# Patient Record
Sex: Male | Born: 1945 | Race: White | Hispanic: No | Marital: Single | Smoking: Former smoker
Health system: Southern US, Community
[De-identification: ages and names within clinical notes are randomized; demographics above are authoritative.]

## PROBLEM LIST (undated history)

## (undated) DIAGNOSIS — E785 Hyperlipidemia, unspecified: Secondary | ICD-10-CM

## (undated) DIAGNOSIS — M545 Low back pain: Secondary | ICD-10-CM

## (undated) DIAGNOSIS — Z86010 Personal history of colonic polyps: Secondary | ICD-10-CM

## (undated) DIAGNOSIS — M7551 Bursitis of right shoulder: Secondary | ICD-10-CM

## (undated) DIAGNOSIS — H269 Unspecified cataract: Secondary | ICD-10-CM

## (undated) DIAGNOSIS — C4491 Basal cell carcinoma of skin, unspecified: Secondary | ICD-10-CM

## (undated) DIAGNOSIS — R7303 Prediabetes: Secondary | ICD-10-CM

## (undated) DIAGNOSIS — M1990 Unspecified osteoarthritis, unspecified site: Secondary | ICD-10-CM

## (undated) DIAGNOSIS — M999 Biomechanical lesion, unspecified: Secondary | ICD-10-CM

## (undated) DIAGNOSIS — E7211 Homocystinuria: Secondary | ICD-10-CM

## (undated) DIAGNOSIS — Z8739 Personal history of other diseases of the musculoskeletal system and connective tissue: Secondary | ICD-10-CM

## (undated) DIAGNOSIS — K2270 Barrett's esophagus without dysplasia: Secondary | ICD-10-CM

## (undated) DIAGNOSIS — N189 Chronic kidney disease, unspecified: Secondary | ICD-10-CM

## (undated) DIAGNOSIS — C61 Malignant neoplasm of prostate: Secondary | ICD-10-CM

## (undated) DIAGNOSIS — T7840XA Allergy, unspecified, initial encounter: Secondary | ICD-10-CM

## (undated) DIAGNOSIS — Z8546 Personal history of malignant neoplasm of prostate: Secondary | ICD-10-CM

## (undated) DIAGNOSIS — I5189 Other ill-defined heart diseases: Secondary | ICD-10-CM

## (undated) HISTORY — DX: Malignant neoplasm of prostate: C61

## (undated) HISTORY — DX: Unspecified cataract: H26.9

## (undated) HISTORY — DX: Unspecified osteoarthritis, unspecified site: M19.90

## (undated) HISTORY — PX: SHOULDER ARTHROSCOPY: SHX128

## (undated) HISTORY — PX: SIGMOIDOSCOPY: SUR1295

## (undated) HISTORY — DX: Allergy, unspecified, initial encounter: T78.40XA

## (undated) HISTORY — PX: TONSILLECTOMY AND ADENOIDECTOMY: SUR1326

---
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
## 1996-02-01 HISTORY — PX: PROSTATECTOMY: SHX69

## 1997-06-12 ENCOUNTER — Encounter: Admission: RE | Admit: 1997-06-12 | Discharge: 1997-06-14 | Payer: Self-pay | Admitting: Radiation Oncology

## 1997-06-14 ENCOUNTER — Encounter: Admission: RE | Admit: 1997-06-14 | Discharge: 1997-09-12 | Payer: Self-pay | Admitting: Radiation Oncology

## 1999-05-26 ENCOUNTER — Ambulatory Visit (HOSPITAL_COMMUNITY): Admission: RE | Admit: 1999-05-26 | Discharge: 1999-05-26 | Payer: Self-pay | Admitting: Internal Medicine

## 1999-05-26 ENCOUNTER — Encounter: Payer: Self-pay | Admitting: Internal Medicine

## 1999-07-21 ENCOUNTER — Ambulatory Visit (HOSPITAL_COMMUNITY): Admission: RE | Admit: 1999-07-21 | Discharge: 1999-07-21 | Payer: Self-pay | Admitting: Neurological Surgery

## 1999-07-21 ENCOUNTER — Encounter: Payer: Self-pay | Admitting: Neurological Surgery

## 1999-08-31 ENCOUNTER — Ambulatory Visit (HOSPITAL_COMMUNITY): Admission: RE | Admit: 1999-08-31 | Discharge: 1999-08-31 | Payer: Self-pay | Admitting: Neurological Surgery

## 1999-08-31 ENCOUNTER — Encounter: Payer: Self-pay | Admitting: Neurological Surgery

## 1999-09-15 ENCOUNTER — Encounter: Payer: Self-pay | Admitting: Neurological Surgery

## 1999-09-15 ENCOUNTER — Ambulatory Visit (HOSPITAL_COMMUNITY): Admission: RE | Admit: 1999-09-15 | Discharge: 1999-09-15 | Payer: Self-pay | Admitting: Neurological Surgery

## 2000-02-01 HISTORY — PX: LUMBAR DISC SURGERY: SHX700

## 2000-04-10 ENCOUNTER — Ambulatory Visit (HOSPITAL_COMMUNITY): Admission: RE | Admit: 2000-04-10 | Discharge: 2000-04-10 | Payer: Self-pay | Admitting: Neurological Surgery

## 2000-04-10 ENCOUNTER — Encounter: Payer: Self-pay | Admitting: Neurological Surgery

## 2000-05-16 ENCOUNTER — Ambulatory Visit (HOSPITAL_COMMUNITY): Admission: RE | Admit: 2000-05-16 | Discharge: 2000-05-17 | Payer: Self-pay | Admitting: Neurological Surgery

## 2000-05-16 ENCOUNTER — Encounter: Payer: Self-pay | Admitting: Neurological Surgery

## 2002-11-20 ENCOUNTER — Encounter: Payer: Self-pay | Admitting: Internal Medicine

## 2002-11-29 ENCOUNTER — Encounter: Admission: RE | Admit: 2002-11-29 | Discharge: 2002-11-29 | Payer: Self-pay | Admitting: Internal Medicine

## 2003-07-22 ENCOUNTER — Observation Stay (HOSPITAL_COMMUNITY): Admission: RE | Admit: 2003-07-22 | Discharge: 2003-07-23 | Payer: Self-pay | Admitting: Internal Medicine

## 2004-03-24 ENCOUNTER — Ambulatory Visit: Payer: Self-pay | Admitting: Internal Medicine

## 2004-03-30 ENCOUNTER — Ambulatory Visit: Payer: Self-pay | Admitting: Internal Medicine

## 2005-05-02 ENCOUNTER — Ambulatory Visit: Payer: Self-pay | Admitting: Internal Medicine

## 2005-05-13 ENCOUNTER — Ambulatory Visit: Payer: Self-pay | Admitting: Internal Medicine

## 2006-05-11 ENCOUNTER — Ambulatory Visit: Payer: Self-pay | Admitting: Internal Medicine

## 2006-05-11 LAB — CONVERTED CEMR LAB
ALT: 24 units/L (ref 0–40)
Albumin: 4 g/dL (ref 3.5–5.2)
Alkaline Phosphatase: 65 units/L (ref 39–117)
Chloride: 104 meq/L (ref 96–112)
Direct LDL: 111.1 mg/dL
GFR calc Af Amer: 79 mL/min
GFR calc non Af Amer: 65 mL/min
HDL: 69.9 mg/dL (ref >39.0)
Monocytes Absolute: 0.9 10*3/uL — ABNORMAL HIGH (ref 0.2–0.7)
Monocytes Relative: 12.2 % — ABNORMAL HIGH (ref 3.0–11.0)
Neutro Abs: 4.4 10*3/uL (ref 1.4–7.7)
Potassium: 4.5 meq/L (ref 3.5–5.1)
RBC: 4.72 M/uL (ref 4.22–5.81)
RDW: 13.6 % (ref 11.5–14.6)
Sodium: 140 meq/L (ref 135–145)
TSH: 1.21 microintl units/mL (ref 0.35–5.50)
Total Bilirubin: 0.8 mg/dL (ref 0.3–1.2)
VLDL: 20 mg/dL (ref 0–40)

## 2006-05-19 ENCOUNTER — Ambulatory Visit: Payer: Self-pay | Admitting: Internal Medicine

## 2006-05-30 ENCOUNTER — Ambulatory Visit: Payer: Self-pay | Admitting: Internal Medicine

## 2006-06-13 ENCOUNTER — Encounter: Admission: RE | Admit: 2006-06-13 | Discharge: 2006-06-13 | Payer: Self-pay | Admitting: Internal Medicine

## 2006-09-28 ENCOUNTER — Ambulatory Visit: Payer: Self-pay | Admitting: Internal Medicine

## 2006-10-03 ENCOUNTER — Encounter (INDEPENDENT_AMBULATORY_CARE_PROVIDER_SITE_OTHER): Payer: Self-pay | Admitting: *Deleted

## 2006-10-17 ENCOUNTER — Ambulatory Visit: Payer: Self-pay | Admitting: Internal Medicine

## 2007-02-06 ENCOUNTER — Ambulatory Visit: Payer: Self-pay | Admitting: Gastroenterology

## 2007-02-14 ENCOUNTER — Encounter: Payer: Self-pay | Admitting: Internal Medicine

## 2007-02-14 ENCOUNTER — Encounter: Payer: Self-pay | Admitting: Gastroenterology

## 2007-02-14 ENCOUNTER — Ambulatory Visit: Payer: Self-pay | Admitting: Gastroenterology

## 2007-10-15 ENCOUNTER — Telehealth (INDEPENDENT_AMBULATORY_CARE_PROVIDER_SITE_OTHER): Payer: Self-pay | Admitting: *Deleted

## 2007-11-19 ENCOUNTER — Ambulatory Visit: Payer: Self-pay | Admitting: Internal Medicine

## 2007-11-19 ENCOUNTER — Telehealth: Payer: Self-pay | Admitting: Internal Medicine

## 2007-11-19 LAB — CONVERTED CEMR LAB
Basophils Absolute: 0.1 10*3/uL (ref 0.0–0.1)
Eosinophils Absolute: 0.1 10*3/uL (ref 0.0–0.7)
Eosinophils Relative: 1.5 % (ref 0.0–5.0)
Lymphocytes Relative: 27 % (ref 12.0–46.0)
MCV: 89 fL (ref 78.0–100.0)
Monocytes Relative: 13.5 % — ABNORMAL HIGH (ref 3.0–12.0)
Neutrophils Relative %: 57.2 % (ref 43.0–77.0)
Platelets: 226 10*3/uL (ref 150–400)
RDW: 13.7 % (ref 11.5–14.6)
WBC: 8 10*3/uL (ref 4.5–10.5)

## 2007-12-03 ENCOUNTER — Telehealth (INDEPENDENT_AMBULATORY_CARE_PROVIDER_SITE_OTHER): Payer: Self-pay | Admitting: *Deleted

## 2008-01-04 ENCOUNTER — Ambulatory Visit: Payer: Self-pay | Admitting: Internal Medicine

## 2008-02-05 ENCOUNTER — Encounter (INDEPENDENT_AMBULATORY_CARE_PROVIDER_SITE_OTHER): Payer: Self-pay | Admitting: *Deleted

## 2008-03-04 ENCOUNTER — Ambulatory Visit: Payer: Self-pay | Admitting: Internal Medicine

## 2008-03-04 LAB — CONVERTED CEMR LAB
Alkaline Phosphatase: 65 units/L (ref 39–117)
Calcium: 9.5 mg/dL (ref 8.4–10.5)
Chloride: 109 meq/L (ref 96–112)
GFR calc non Af Amer: 65 mL/min
Glucose, Bld: 86 mg/dL (ref 70–99)
HCT: 41.7 % (ref 39.0–52.0)
Hemoglobin, Urine: NEGATIVE
Hemoglobin: 14.3 g/dL (ref 13.0–17.0)
Ketones, ur: NEGATIVE mg/dL
LDL Cholesterol: 141 mg/dL — ABNORMAL HIGH (ref 0–99)
Leukocytes, UA: NEGATIVE
Lymphocytes Relative: 27.7 % (ref 12.0–46.0)
Monocytes Absolute: 0.7 10*3/uL (ref 0.1–1.0)
Monocytes Relative: 11.8 % (ref 3.0–12.0)
Nitrite: NEGATIVE
PSA: 0.24 ng/mL (ref 0.10–4.00)
Platelets: 180 10*3/uL (ref 150–400)
Potassium: 4.4 meq/L (ref 3.5–5.1)
RBC: 4.64 M/uL (ref 4.22–5.81)
Sodium: 143 meq/L (ref 135–145)
Specific Gravity, Urine: 1.02 (ref 1.000–1.03)
TSH: 1.37 microintl units/mL (ref 0.35–5.50)
Total Protein, Urine: NEGATIVE mg/dL
Urine Glucose: NEGATIVE mg/dL
WBC: 5.6 10*3/uL (ref 4.5–10.5)
pH: 6 (ref 5.0–8.0)

## 2008-03-12 ENCOUNTER — Ambulatory Visit: Payer: Self-pay | Admitting: Internal Medicine

## 2008-03-12 DIAGNOSIS — Z8546 Personal history of malignant neoplasm of prostate: Secondary | ICD-10-CM

## 2008-03-12 DIAGNOSIS — Z8739 Personal history of other diseases of the musculoskeletal system and connective tissue: Secondary | ICD-10-CM

## 2008-03-12 DIAGNOSIS — Z860100 Personal history of colon polyps, unspecified: Secondary | ICD-10-CM

## 2008-03-12 DIAGNOSIS — E785 Hyperlipidemia, unspecified: Secondary | ICD-10-CM

## 2008-03-12 HISTORY — DX: Personal history of colonic polyps: Z86.010

## 2008-03-12 HISTORY — DX: Personal history of other diseases of the musculoskeletal system and connective tissue: Z87.39

## 2008-03-12 HISTORY — DX: Personal history of malignant neoplasm of prostate: Z85.46

## 2008-03-12 HISTORY — DX: Personal history of colon polyps, unspecified: Z86.0100

## 2008-03-12 HISTORY — DX: Hyperlipidemia, unspecified: E78.5

## 2008-03-12 LAB — CONVERTED CEMR LAB
Cholesterol, target level: 200 mg/dL
HDL goal, serum: 40 mg/dL
LDL Goal: 160 mg/dL

## 2008-03-14 ENCOUNTER — Encounter (INDEPENDENT_AMBULATORY_CARE_PROVIDER_SITE_OTHER): Payer: Self-pay | Admitting: *Deleted

## 2008-03-14 ENCOUNTER — Telehealth: Payer: Self-pay | Admitting: Internal Medicine

## 2008-03-14 LAB — CONVERTED CEMR LAB
Rhuematoid fact SerPl-aCnc: 40.4 intl units/mL — ABNORMAL HIGH (ref 0.0–20.0)
Sed Rate: 10 mm/hr (ref 0–16)
Uric Acid, Serum: 7.8 mg/dL (ref 4.0–7.8)

## 2008-03-19 ENCOUNTER — Telehealth (INDEPENDENT_AMBULATORY_CARE_PROVIDER_SITE_OTHER): Payer: Self-pay | Admitting: *Deleted

## 2008-03-25 ENCOUNTER — Encounter: Payer: Self-pay | Admitting: Internal Medicine

## 2008-06-04 ENCOUNTER — Ambulatory Visit: Payer: Self-pay | Admitting: Internal Medicine

## 2008-06-04 LAB — CONVERTED CEMR LAB: Rapid Strep: NEGATIVE

## 2008-08-25 ENCOUNTER — Ambulatory Visit: Payer: Self-pay | Admitting: Internal Medicine

## 2008-10-09 ENCOUNTER — Encounter: Payer: Self-pay | Admitting: Internal Medicine

## 2009-01-09 ENCOUNTER — Ambulatory Visit: Payer: Self-pay | Admitting: Family

## 2009-01-13 ENCOUNTER — Telehealth (INDEPENDENT_AMBULATORY_CARE_PROVIDER_SITE_OTHER): Payer: Self-pay | Admitting: *Deleted

## 2009-02-17 ENCOUNTER — Telehealth (INDEPENDENT_AMBULATORY_CARE_PROVIDER_SITE_OTHER): Payer: Self-pay | Admitting: *Deleted

## 2009-03-16 ENCOUNTER — Ambulatory Visit: Payer: Self-pay | Admitting: Internal Medicine

## 2009-03-22 LAB — CONVERTED CEMR LAB
AST: 30 units/L (ref 0–37)
Alkaline Phosphatase: 63 units/L (ref 39–117)
Basophils Relative: 0.7 % (ref 0.0–3.0)
Calcium: 9.8 mg/dL (ref 8.4–10.5)
Cholesterol: 242 mg/dL — ABNORMAL HIGH (ref 0–200)
Creatinine, Ser: 1 mg/dL (ref 0.4–1.5)
Eosinophils Absolute: 0.1 10*3/uL (ref 0.0–0.7)
Eosinophils Relative: 2.1 % (ref 0.0–5.0)
GFR calc non Af Amer: 79.99 mL/min (ref >60)
Glucose, Bld: 88 mg/dL (ref 70–99)
HCT: 43.3 % (ref 39.0–52.0)
HDL: 81.2 mg/dL (ref >39.00)
Ketones, ur: NEGATIVE mg/dL
Leukocytes, UA: NEGATIVE
Lymphs Abs: 1.8 10*3/uL (ref 0.7–4.0)
MCHC: 33.3 g/dL (ref 30.0–36.0)
MCV: 94.5 fL (ref 78.0–100.0)
PSA: 0.68 ng/mL (ref 0.10–4.00)
Platelets: 181 10*3/uL (ref 150.0–400.0)
RBC: 4.58 M/uL (ref 4.22–5.81)
RDW: 14.5 % (ref 11.5–14.6)
Specific Gravity, Urine: <=1.005 (ref 1.000–1.030)
TSH: 0.89 microintl units/mL (ref 0.35–5.50)
Total Bilirubin: 0.7 mg/dL (ref 0.3–1.2)
Total Protein, Urine: NEGATIVE mg/dL
Urine Glucose: NEGATIVE mg/dL
Urobilinogen, UA: 0.2 (ref 0.0–1.0)
VLDL: 13.6 mg/dL (ref 0.0–40.0)

## 2009-03-24 ENCOUNTER — Ambulatory Visit: Payer: Self-pay | Admitting: Internal Medicine

## 2009-09-10 ENCOUNTER — Encounter: Payer: Self-pay | Admitting: Internal Medicine

## 2009-09-17 ENCOUNTER — Ambulatory Visit: Payer: Self-pay | Admitting: Internal Medicine

## 2009-09-17 LAB — CONVERTED CEMR LAB: PSA: 0.68 ng/mL (ref 0.10–4.00)

## 2009-09-24 ENCOUNTER — Encounter: Payer: Self-pay | Admitting: Internal Medicine

## 2009-09-30 ENCOUNTER — Encounter: Payer: Self-pay | Admitting: Internal Medicine

## 2009-12-15 ENCOUNTER — Encounter: Payer: Self-pay | Admitting: Internal Medicine

## 2009-12-16 ENCOUNTER — Ambulatory Visit: Payer: Self-pay | Admitting: Internal Medicine

## 2009-12-16 ENCOUNTER — Encounter: Payer: Self-pay | Admitting: Internal Medicine

## 2009-12-16 DIAGNOSIS — R079 Chest pain, unspecified: Secondary | ICD-10-CM

## 2009-12-16 DIAGNOSIS — R0789 Other chest pain: Secondary | ICD-10-CM

## 2009-12-17 LAB — CONVERTED CEMR LAB
CK-MB: 1.2 ng/mL (ref 0.3–4.0)
Eosinophils Absolute: 0.1 10*3/uL (ref 0.0–0.7)
Eosinophils Relative: 1.5 % (ref 0.0–5.0)
Lymphocytes Relative: 29.7 % (ref 12.0–46.0)
MCV: 95.9 fL (ref 78.0–100.0)
Monocytes Relative: 12.2 % — ABNORMAL HIGH (ref 3.0–12.0)
Neutrophils Relative %: 56.1 % (ref 43.0–77.0)
Platelets: 223 10*3/uL (ref 150.0–400.0)
RBC: 4.47 M/uL (ref 4.22–5.81)
Total CK: 77 units/L (ref 7–232)
Troponin I: 0.01 ng/mL (ref <0.06)

## 2009-12-21 ENCOUNTER — Telehealth (INDEPENDENT_AMBULATORY_CARE_PROVIDER_SITE_OTHER): Payer: Self-pay | Admitting: *Deleted

## 2009-12-22 ENCOUNTER — Ambulatory Visit: Payer: Self-pay | Admitting: Physician Assistant

## 2009-12-22 ENCOUNTER — Ambulatory Visit: Payer: Self-pay

## 2009-12-22 ENCOUNTER — Encounter (HOSPITAL_COMMUNITY)
Admission: RE | Admit: 2009-12-22 | Discharge: 2010-03-02 | Payer: Self-pay | Source: Home / Self Care | Attending: Internal Medicine | Admitting: Internal Medicine

## 2010-01-14 ENCOUNTER — Ambulatory Visit: Payer: Self-pay | Admitting: Internal Medicine

## 2010-01-14 DIAGNOSIS — J069 Acute upper respiratory infection, unspecified: Secondary | ICD-10-CM | POA: Insufficient documentation

## 2010-02-15 ENCOUNTER — Telehealth: Payer: Self-pay | Admitting: Internal Medicine

## 2010-02-19 ENCOUNTER — Encounter: Payer: Self-pay | Admitting: Internal Medicine

## 2010-03-01 ENCOUNTER — Ambulatory Visit
Admission: RE | Admit: 2010-03-01 | Discharge: 2010-03-01 | Payer: Self-pay | Source: Home / Self Care | Attending: Internal Medicine | Admitting: Internal Medicine

## 2010-03-01 ENCOUNTER — Other Ambulatory Visit: Payer: Self-pay | Admitting: Internal Medicine

## 2010-03-02 ENCOUNTER — Telehealth (INDEPENDENT_AMBULATORY_CARE_PROVIDER_SITE_OTHER): Payer: Self-pay | Admitting: *Deleted

## 2010-03-02 NOTE — Miscellaneous (Signed)
Summary: [facility]   Imported By: Lanelle Bal 10/13/2009 09:06:31  _____________________________________________________________________  External Attachment:    Type:   Image     Comment:   External Document

## 2010-03-02 NOTE — Progress Notes (Signed)
Summary: Nuclear pre procedure  Phone Note Outgoing Call Call back at [facility] Phone [phone]   Call placed by: Rea College, CMA,  December 21, 2009 4:53 PM Call placed to: Patient Summary of Call: Left message with information on My[employer] Information Sheet (see scanned document for details).      Nuclear Med Background Indications for Stress Test: Evaluation for Ischemia     Symptoms: Chest Pressure, Chest Pressure with Exertion    Nuclear Pre-Procedure Height (in): 72

## 2010-03-02 NOTE — Letter (Signed)
Summary: [facility]  [facility]   Imported By: Lanelle Bal 10/06/2009 10:58:57  _____________________________________________________________________  External Attachment:    Type:   Image     Comment:   External Document

## 2010-03-02 NOTE — Assessment & Plan Note (Signed)
Summary: YEARLY//PH   Vital Signs:  Patient profile:   65 year old male Height:      72 inches Weight:      193.4 pounds BMI:     26.32 Temp:     98.8 degrees F oral Resp:     14 per minute BP sitting:   122 / 78  (left arm) Cuff size:   large  Vitals Entered By: Shonna Chock (March 24, 2009 10:49 AM)  Comments REVIEWED MED LIST, PATIENT AGREED DOSE AND INSTRUCTION CORRECT    History of Present Illness: Tom Gaines is here for a physical; he is asymptomatic.  Allergies: 1)  ! Pcn  Past History:  Past Medical History: Plantar fasciitis, PMH of Prostate cancer, hx of, Dr Darvin Neighbours Colonic polyps, hx of,Dr Russella Dar Hyperlipidemia: NMR Lipoprofile 2004: LDL 126(1084/80), HDL 73.5, normal TG. LDL goal = < 160, ideal < 161 Pharyngitis with dysphagia, PMH of  2006 Gout, PMH of OSscleral  hyperpigmentation  Past Surgical History: Lumbar discectomy L3-4  Dr Danielle Dess 2002 Colon polypectomy 1998,negative 2008,Dr [facility] Prostatectomy 1998, Radiation 1999, Dr Darvin Neighbours Tonsillectomy L shoulder surgery 1994, Dr Elwyn Lade shoulder surgery 02/25/2009, Dr Thurston Hole  Family History: Father: unknown medical history Mother:AODM,HTN Siblings: sister weight excess;cousin polio  Social History: Occupation: retired Married Former Smoker:quit 1985 Alcohol use-yes: socailly 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         Occasional pill dysphagia. GU:  Denies discharge, dysuria, and hematuria; Nocturia X 1-2 chronically. PSA was 0.51 in 12/2008; appt with Dr Earlene Plater  03/31/2009.  Physical Exam  General:  well-nourished; alert,appropriate and cooperative throughout examination Head:  Normocephalic and atraumatic without obvious abnormalities.  Eyes:  No corneal or conjunctival inflammation noted.Perrla. Funduscopic exam benign, without hemorrhages, exudates or papilledema. Curvilinear hyperpigmentation OS sclera Ears:  External ear exam shows no significant lesions or deformities.  Otoscopic examination reveals clear canals, tympanic membranes are intact bilaterally without bulging, retraction, inflammation or discharge. Hearing is grossly normal bilaterally. Nose:  External nasal examination shows no deformity or inflammation. Nasal mucosa are pink and moist without lesions or exudates. Mouth:  Oral mucosa and oropharynx without lesions or exudates.  Teeth in good repair. Neck:  No deformities, masses, or tenderness noted. Lungs:  Normal respiratory effort, chest expands symmetrically. Lungs are clear to auscultation, no crackles or wheezes. Heart:  Normal rate and regular rhythm. S1 and S2 normal without gallop, murmur, click, rub . S4 with slurring Abdomen:  Bowel sounds positive,abdomen soft and non-tender without masses, organomegaly or hernias noted. Genitalia:  Dr Earlene Plater Msk:  No deformity or scoliosis noted of thoracic or lumbar spine.   Pulses:  R and L carotid,radial,dorsalis pedis and posterior tibial pulses are full and equal bilaterally Extremities:  No clubbing, cyanosis, edema, or deformity noted with normal full range of motion of all joints.  Minimal crepitus R shoulder Neurologic:  alert & oriented X3 and DTRs symmetrical and normal.   Skin:  Intact without suspicious lesions or rashes Cervical Nodes:  No lymphadenopathy noted Axillary Nodes:  No palpable lymphadenopathy Psych:  memory intact for recent and remote, normally interactive, and good eye contact.     Impression & Recommendations:  Problem #  1:  PREVENTIVE HEALTH CARE  (ICD-V70.0)  Orders: EKG w/ Interpretation (93000)  Problem # 2:  HYPERLIPIDEMIA (ICD-272.4)  Orders: EKG w/ Interpretation (93000)  Problem # 3:  PROSTATE CANCER, HX OF (ICD-V10.46) as per Dr  Earlene Plater  Problem # 4:  COLONIC POLYPS, HX OF (ICD-V12.72) as per Dr Russella Dar  Complete Medication List: 1)  Allopurinol 100 Mg Tabs (Allopurinol) .Marland Kitchen.. 1 by mouth once daily 2)  Bayer Low Strength 81 Mg Tbec (Aspirin) .Marland Kitchen.. 1 by mouth once daily 3)  Lutein 10 Mg Tabs (Lutein) .Marland Kitchen.. 1 by mouth once daily 4)  Multivitamins Tabs (Multiple vitamin) .Marland Kitchen.. 1 by mouth once daily 5)  Aleve 220 Mg Tabs (Naproxen sodium) .Marland Kitchen.. 1-2 two times a day as needed 6)  Fish Oil 1000 Mg Caps (Omega-3 fatty acids) .... 2 once daily 7)  Vitamin C 500 Mg Tabs (Ascorbic acid) .Marland Kitchen.. 1 by mouth once daily  Patient Instructions: 1)  Based on NMR Lipoprofile your LDL goal = < 160, ideally < 130.

## 2010-03-02 NOTE — Letter (Signed)
Summary: [facility]  [facility]   Imported By: Lanelle Bal 10/14/2009 12:09:49  _____________________________________________________________________  External Attachment:    Type:   Image     Comment:   External Document

## 2010-03-02 NOTE — Letter (Signed)
Summary: E Mail from Patient with Concerns  E Mail from Patient with Concerns   Imported By: Lanelle Bal 12/25/2009 10:25:46  _____________________________________________________________________  External Attachment:    Type:   Image     Comment:   External Document

## 2010-03-02 NOTE — Progress Notes (Signed)
Summary: LAB ORDERS  Phone Note Call from Patient   Caller: Patient Summary of Call: NEED ORDERS FOR LABS SO PT CAN GO TO [facility] HAVE  THIS DONE. HE HAS A CPX TO BE DONE IN FEB ALSO. NEED ORDER FOR 2.14.11 TO BE PUT IN Initial call taken by: Freddy Jaksch,  February 17, 2009 2:25 PM  Follow-up for Phone Call        Lipid,hepatic,cbcd,bmp,tsh,psa,udip,stool cards v70.0/272.4 Follow-up by: Shonna Chock,  March 02, 2009 5:12 PM

## 2010-03-02 NOTE — Letter (Signed)
Summary: E Mail from Patient Regarding PSA  E Mail from Patient Regarding PSA   Imported By: Lanelle Bal 09/17/2009 11:15:39  _____________________________________________________________________  External Attachment:    Type:   Image     Comment:   External Document

## 2010-03-02 NOTE — Miscellaneous (Signed)
Summary: Orders Update  Clinical Lists Changes  Orders: Added new Test order of T-2 View CXR (71020TC) - Signed 

## 2010-03-02 NOTE — Assessment & Plan Note (Signed)
Summary: strange feeling in chest,pressure in ears/cbs   Vital Signs:  Patient profile:   65 year old male Height:      72 inches Weight:      191 pounds O2 Sat:      94 % on Room air Temp:     98.0 degrees F oral Pulse rate:   68 / minute Resp:     14 per minute BP sitting:   118 / 70  (left arm)  Vitals Entered By: Jeremy Johann CMA (December 16, 2009 12:22 PM)  O2 Flow:  Room air CC: funny feeling in chest, pressure in head x1day   CC:  funny feeling in chest and pressure in head x1day.  History of Present Illness:      This is a 65 year old male who presents with Chest pain for > 24 hrs.  This was  exertional chest pain,starting ? 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 He  denies nausea, vomiting, diaphoresis, shortness of breath, palpitations, dizziness, light headedness, syncope, and indigestion.  The painwas  described as constant and dull.  The pain is located in the right upper chest; it  radiated  to the left anterior chest and neck.  Chest pain persists but substantially better, " > 50% better".  The pain is brought on or made worse by  waist flexion.No FH CAD.  Current Medications (verified): 1)  Allopurinol 100 Mg Tabs (Allopurinol) .Marland Kitchen.. 1 By Mouth Once Daily 2)  Bayer Low Strength 81 Mg Tbec (Aspirin) .Marland Kitchen.. 1 By Mouth Once Daily 3)  Lutein 10 Mg Tabs (Lutein) .Marland Kitchen.. 1 By Mouth Once Daily 4)  Multivitamins  Tabs (Multiple Vitamin) .Marland Kitchen.. 1 By Mouth Once Daily 5)  Aleve 220 Mg Tabs (Naproxen Sodium) .... As Needed Only 6)  Fish Oil 1000 Mg Caps (Omega-3 Fatty Acids) .... 2 Once Daily 7)  Vitamin C 500 Mg Tabs (Ascorbic Acid) .Marland Kitchen.. 1 By Mouth Once Daily  Allergies (verified): 1)  ! Pcn  Review of Systems ENT:  Denies difficulty swallowing and hoarseness. GI:  Denies bloody stools, dark tarry stools, and loss of appetite; No dysphagia.  Physical Exam  General:   well-nourished,in no acute distress; alert,appropriate and cooperative throughout examination Eyes:  No corneal or conjunctival inflammation noted. No icterus Mouth:  Oral mucosa and oropharynx without lesions or exudates.  Teeth in good repair. No pharyngeal erythema.   Lungs:  Normal respiratory effort, chest expands symmetrically. Lungs are clear to auscultation, no crackles or wheezes. Heart:  regular rhythm, no murmur, no gallop, no rub, no JVD, no HJR, and bradycardia.   Pulses:  R and L carotid,radial,dorsalis pedis and posterior tibial pulses are full and equal bilaterally Extremities:  No clubbing, cyanosis, edema. Homan's negative Skin:  Intact without suspicious lesions or rashes Cervical Nodes:  No lymphadenopathy noted Axillary Nodes:  No palpable lymphadenopathy Psych:  memory intact for recent and remote, normally interactive, and good eye contact.     Impression & Recommendations:  Problem # 1:  CHEST DISCOMFORT (ICD-786.59)  atypical, R/O Hiatal Hernia with reflux  Orders: EKG w/ Interpretation (93000) Venipuncture (75643) TLB-CBC Platelet - w/Differential (85025-CBCD) TLB-Cardiac Panel ([id number]) T-D-Dimer Fibrin Derivatives Quantitive [id number]) T-Troponin I ([id number]) Misc. Referral (Misc. Ref)  Complete Medication List: 1)  Allopurinol 100 Mg Tabs (Allopurinol) .Marland Kitchen.. 1 by mouth once daily 2)  Bayer Low Strength 81 Mg Tbec (Aspirin) .Marland KitchenMarland KitchenMarland Kitchen 1  by mouth once daily 3)  Lutein 10 Mg Tabs (Lutein) .Marland Kitchen.. 1 by mouth once daily 4)  Multivitamins Tabs (Multiple vitamin) .Marland Kitchen.. 1 by mouth once daily 5)  Aleve 220 Mg Tabs (Naproxen sodium) .... As needed only 6)  Fish Oil 1000 Mg Caps (Omega-3 fatty acids) .... 2 once daily 7)  Vitamin C 500 Mg Tabs (Ascorbic acid) .Marland Kitchen.. 1 by mouth once daily 8)  Omeprazole 20 Mg Cpdr (Omeprazole) .Marland Kitchen.. 1 pill 30 min pre b'fast  Patient Instructions: 1)  Hold exercise until stress test is completed. 2)  Avoid foods high in acid  (tomatoes, citrus juices, spicy foods). Avoid eating within two hours of lying down or before exercising. Do not over eat; try smaller more frequent meals. Elevate head of bed twelve inches when sleeping. Prescriptions: OMEPRAZOLE 20 MG CPDR (OMEPRAZOLE) 1 pill 30 min pre b'fast  #30 x 0   Entered and Authorized by:   Marga Melnick MD   Signed by:   Marga Melnick MD on 12/16/2009   Method used:   Print then Give to Patient   RxID:   [id number]    Orders Added: 1)  EKG w/ Interpretation [93000] 2)  Est. Patient Level IV [36644] 3)  Venipuncture [03474] 4)  TLB-CBC Platelet - w/Differential [85025-CBCD] 5)  TLB-Cardiac Panel [[id number]] 6)  T-D-Dimer Fibrin Derivatives Quantitive [[id number]] 7)  T-Troponin I [[id number]] 8)  Misc. Referral [Misc. Ref]

## 2010-03-04 NOTE — Progress Notes (Signed)
Summary: Med refill--[facility]  Phone Note Refill Request Message from:  Patient on February 15, 2010 4:00 PM  Refills Requested: Medication #1:  OMEPRAZOLE 20 MG CPDR 1 pill 30 min pre b'fast  Medication #2:  FLUTICASONE PROPIONATE 50 MCG/ACT SUSP 1 two times a day as needed. Patient and MD have been communicating via e-mail.  I did speak with the patient and confirm that rxs need to go to [facility].  Initial call taken by: Lucious Groves CMA,  February 15, 2010 4:00 PM    Prescriptions: FLUTICASONE PROPIONATE 50 MCG/ACT SUSP (FLUTICASONE PROPIONATE) 1 two times a day as needed  #3 x 3   Entered by:   Lucious Groves CMA   Authorized by:   Marga Melnick MD   Signed by:   Lucious Groves CMA on 02/15/2010   Method used:   Faxed to ...       [facility]* (retail)             ,          Ph: [phone]       Fax: [phone]   RxID:   [id number] OMEPRAZOLE 20 MG CPDR (OMEPRAZOLE) 1 pill 30 min pre b'fast  #90 x 3   Entered by:   Lucious Groves CMA   Authorized by:   Marga Melnick MD   Signed by:   Lucious Groves CMA on 02/15/2010   Method used:   Faxed to ...       [facility]* (retail)             ,          Ph: [phone]       Fax: [phone]   RxID:   [id number]

## 2010-03-04 NOTE — Letter (Signed)
Summary: E Mail from Patient Regarding Meds  E Mail from Patient Regarding Meds   Imported By: Lanelle Bal 01/28/2010 13:29:26  _____________________________________________________________________  External Attachment:    Type:   Image     Comment:   External Document

## 2010-03-04 NOTE — Assessment & Plan Note (Signed)
Summary: SINUS INFECTION//PH   Vital Signs:  Patient profile:   65 year old male Weight:      191.4 pounds BMI:     26.05 Temp:     98.9 degrees F oral Pulse rate:   76 / minute Resp:     14 per minute BP sitting:   120 / 82  (left arm) Cuff size:   large  Vitals Entered By: Shonna Chock CMA (January 14, 2010 12:00 PM) CC: 1.) Sinus Infection   2.)Discuss omeprazole, URI symptoms   CC:  1.) Sinus Infection   2.)Discuss omeprazole and URI symptoms.  History of Present Illness:      This is a 65 year old man who presents with URI symptoms; onset 12/14 as ST.  The patient reports nasal congestion, scant purulent nasal discharge, productive cough with green "globs", and ear  pressure. The patient denies fever, dyspnea, and wheezing.  The patient also reports headache.  The patient denies the following risk factors for Strep sinusitis: unilateral facial pain, tooth pain, and tender adenopathy. Rx: saline ,Listerine  , Dayquil & Nyquil, Aleve & ASA. He had the Fu shot.  Current Medications (verified): 1)  Allopurinol 100 Mg Tabs (Allopurinol) .Marland Kitchen.. 1 By Mouth Once Daily 2)  Bayer Low Strength 81 Mg Tbec (Aspirin) .Marland Kitchen.. 1 By Mouth Once Daily 3)  Lutein 10 Mg Tabs (Lutein) .Marland Kitchen.. 1 By Mouth Once Daily 4)  Multivitamins  Tabs (Multiple Vitamin) .Marland Kitchen.. 1 By Mouth Once Daily 5)  Aleve 220 Mg Tabs (Naproxen Sodium) .... As Needed Only 6)  Fish Oil 1000 Mg Caps (Omega-3 Fatty Acids) .... 2 Once Daily 7)  Vitamin C 500 Mg Tabs (Ascorbic Acid) .Marland Kitchen.. 1 By Mouth Once Daily 8)  Omeprazole 20 Mg Cpdr (Omeprazole) .Marland Kitchen.. 1 Pill 30 Min Pre B'fast 9)  Glucosamine-Chondroitin 250-200 Mg Caps (Glucosamine-Chondroitin) .Marland Kitchen.. 1 By Mouth Once Daily  Allergies: 1)  ! Pcn  Physical Exam  General:  well-nourished,in no acute distress; alert,appropriate and cooperative throughout examination Ears:  External ear exam shows no significant lesions or deformities.  Otoscopic examination reveals clear canals, tympanic  membranes are intact bilaterally without bulging, retraction, inflammation or discharge. Hearing is grossly normal bilaterally. Nose:  External nasal examination shows no deformity or inflammation. Nasal mucosa are  erythematous without lesions or exudates. Hyponasal speech Mouth:  Oral mucosa and oropharynx without lesions or exudates.  Teeth in good repair.Hoarse Lungs:  Normal respiratory effort, chest expands symmetrically. Lungs are clear to auscultation, no crackles or wheezes. Heart:  regular rhythm, no murmur, no gallop, no rub, and bradycardia.   Cervical Nodes:  No lymphadenopathy noted Axillary Nodes:  No palpable lymphadenopathy   Impression & Recommendations:  Problem # 1:  PHARYNGITIS-ACUTE (ICD-462)  His updated medication list for this problem includes:    Bayer Low Strength 81 Mg Tbec (Aspirin) .Marland Kitchen... 1 by mouth once daily    Aleve 220 Mg Tabs (Naproxen sodium) .Marland Kitchen... As needed only    Clarithromycin 500 Mg Xr24h-tab (Clarithromycin) .Marland Kitchen... 2 once daily with food  Problem # 2:  URI (ICD-465.9)  His updated medication list for this problem includes:    Bayer Low Strength 81 Mg Tbec (Aspirin) .Marland Kitchen... 1 by mouth once daily    Aleve 220 Mg Tabs (Naproxen sodium) .Marland Kitchen... As needed only  Complete Medication List: 1)  Allopurinol 100 Mg Tabs (Allopurinol) .Marland Kitchen.. 1 by mouth once daily 2)  Bayer Low Strength 81 Mg Tbec (Aspirin) .Marland Kitchen.. 1 by mouth once daily 3)  Lutein 10 Mg Tabs (Lutein) .Marland Kitchen.. 1 by mouth once daily 4)  Multivitamins Tabs (Multiple vitamin) .Marland Kitchen.. 1 by mouth once daily 5)  Aleve 220 Mg Tabs (Naproxen sodium) .... As needed only 6)  Fish Oil 1000 Mg Caps (Omega-3 fatty acids) .... 2 once daily 7)  Vitamin C 500 Mg Tabs (Ascorbic acid) .Marland Kitchen.. 1 by mouth once daily 8)  Omeprazole 20 Mg Cpdr (Omeprazole) .Marland Kitchen.. 1 pill 30 min pre b'fast 9)  Glucosamine-chondroitin 250-200 Mg Caps (Glucosamine-chondroitin) .Marland Kitchen.. 1 by mouth once daily 10)  Clarithromycin 500 Mg Xr24h-tab  (Clarithromycin) .... 2 once daily with food 11)  Fluticasone Propionate 50 Mcg/act Susp (Fluticasone propionate) .Marland Kitchen.. 1 two times a day as needed  Patient Instructions: 1)  Neti pot once daily - two times a day followed by nasal spray. 2)  Drink as much NON dairy  fluid as you can tolerate for the next few days. Prescriptions: OMEPRAZOLE 20 MG CPDR (OMEPRAZOLE) 1 pill 30 min pre b'fast  #30 x 5   Entered and Authorized by:   Marga Melnick MD   Signed by:   Marga Melnick MD on 01/14/2010   Method used:   Faxed to ...       [facility] [address] [phone]* (retail)       [address]       [place], [place]  [address]       Ph: [phone] or [phone]       Fax: [phone]   RxID:   [id number] FLUTICASONE PROPIONATE 50 MCG/ACT SUSP (FLUTICASONE PROPIONATE) 1 two times a day as needed  #1 x 11   Entered and Authorized by:   Marga Melnick MD   Signed by:   Marga Melnick MD on 01/14/2010   Method used:   Faxed to ...       [facility] [address] [phone]* (retail)       [address]       [place], [place]  [address]       Ph: [phone] or [phone]       Fax: [phone]   RxID:   [id number] CLARITHROMYCIN 500 MG XR24H-TAB (CLARITHROMYCIN) 2 once daily with food  #20 x 0   Entered and Authorized by:   Marga Melnick MD   Signed by:   Marga Melnick MD on 01/14/2010   Method used:   Faxed to ...       [facility] [address] [phone]* (retail)       [address]       [place], [place]  [address]       Ph: [phone] or [phone]       Fax: [phone]   RxID:   [id number]    Orders Added: 1)  Est. Patient Level III [38101]

## 2010-03-10 NOTE — Progress Notes (Signed)
Summary: Lab Results  Phone Note Outgoing Call Call back at Home Phone [phone]   Call placed by: Shonna Chock CMA,  March 02, 2010 9:11 AM Call placed to: Patient Summary of Call: Left message on machine for patient to return call when avaliable, Reason for call:  please share this report with your Urologist. Alfonse Flavors, PSA 0.60  [facility] CMA  March 02, 2010 9:12 AM   Follow-up for Phone Call        Left message on machine for patient to return call when avaliable, Reason for call:   discuss labs  Follow-up by: Shonna Chock CMA,  March 02, 2010 3:23 PM  Additional Follow-up for Phone Call Additional follow up Details #1::        Patient aware  Additional Follow-up by: Shonna Chock CMA,  March 02, 2010 4:21 PM

## 2010-03-10 NOTE — Letter (Signed)
Summary: E Mail from Patient Regarding Labs  E Mail from Patient Regarding Labs   Imported By: Lanelle Bal 03/02/2010 10:55:51  _____________________________________________________________________  External Attachment:    Type:   Image     Comment:   External Document

## 2010-03-12 ENCOUNTER — Encounter: Payer: Self-pay | Admitting: Internal Medicine

## 2010-03-24 NOTE — Letter (Signed)
Summary: [facility]  [facility]   Imported By: Maryln Gottron 03/17/2010 10:07:27  _____________________________________________________________________  External Attachment:    Type:   Image     Comment:   External Document

## 2010-03-30 NOTE — Letter (Signed)
Summary: [facility]  [facility]   Imported By: Maryln Gottron 03/24/2010 10:47:15  _____________________________________________________________________  External Attachment:    Type:   Image     Comment:   External Document

## 2010-05-14 ENCOUNTER — Ambulatory Visit (INDEPENDENT_AMBULATORY_CARE_PROVIDER_SITE_OTHER): Payer: Medicare Other | Admitting: Internal Medicine

## 2010-05-14 ENCOUNTER — Encounter: Payer: Self-pay | Admitting: Internal Medicine

## 2010-05-14 VITALS — Temp 98.6°F | Wt 198.0 lb

## 2010-05-14 DIAGNOSIS — J069 Acute upper respiratory infection, unspecified: Secondary | ICD-10-CM

## 2010-05-14 DIAGNOSIS — R040 Epistaxis: Secondary | ICD-10-CM

## 2010-05-14 MED ORDER — MUPIROCIN 2 % EX OINT: TOPICAL_OINTMENT | CUTANEOUS | Status: DC

## 2010-05-14 NOTE — Patient Instructions (Signed)
Please use the supplement Airborne or Echinacea and vitamin C 2000 mg ver the next 48 hours. If you develop facial pain, frontal headaches, and significant nasal purulence; start the antibiotic. Use the Nasonex sample twice a day, using the crossover technique. Also use Bactroban ointment twice a day  to nasal septum.

## 2010-05-14 NOTE — Progress Notes (Signed)
  Subjective:    Patient ID: Tom Gaines, male    DOB: August 15, 1945, 65 y.o.   MRN: [id number]  HPI  SINUSITIS  Onset: 05/12/2010  As ear & sinus congestion/ pressure   Severity: 4 on 10 scale Worse with: N/A   Better with: Nyquil  Symptoms; intermittent epistaxis since 4/11 Cough: no Runny nose: yes, minimally; but yellow nasal D/C  Fever: no   Highest Temp: N/A Sinus Pressure: yes  Ears Blocked: no  Teeth Ache: no  Frontal Headache: no ; Facial pain : no Second sickening: no   PMH Sinusitis  yes  PMH Prior Sinus or Ear Surgery: no  Recent antibiotic usage (last 30 days): no  PMH of Diabetes or Immunocompromise: no    Red flags  Change in vision: no Rash: no       Review of Systems     Objective:   Physical Exam General appearance is one of good health and nourishment. No  lymphadenopathy about the head, neck, or axilla. See current vital signs Eye - Pupils Equal Round Reactive to light, Extraocular movements intact,Conjunctiva without redness or discharge Ears:  External ear exam shows no significant lesions or deformities.  Otoscopic examination reveals clear canals, tympanic membranes are intact bilaterally without bulging, retraction, inflammation or discharge. Nose:  External nasal examination shows no deformity or inflammation. Nasal mucosa are  dry  without lesions or exudates. No septal dislocation or dislocation.No obstruction to airflow.           Lungs:Chest clear to auscultation; no wheezes, rhonchi,rales ,or rubs present.No increased work of breathing.         Assessment & Plan:  #1 upper respiratory infection with scant purulent nasal secretions. He has not had fever, facial pain, or frontal headaches. Duration of illness is 48 hours.  #2 intermittent epistaxis.  The criteria for rhinosinusitis or not met.  Plan: He will be given a prescription for an antibiotic to take if he fails to respond to topical agents. Specifically Bactroban ointment will be  applied twice a day to the nasal septum and an intranasal steroid will be recommended.

## 2010-06-15 ENCOUNTER — Ambulatory Visit (INDEPENDENT_AMBULATORY_CARE_PROVIDER_SITE_OTHER): Payer: Medicare Other | Admitting: Internal Medicine

## 2010-06-15 ENCOUNTER — Other Ambulatory Visit (INDEPENDENT_AMBULATORY_CARE_PROVIDER_SITE_OTHER): Payer: Medicare Other | Admitting: Internal Medicine

## 2010-06-15 ENCOUNTER — Encounter: Payer: Self-pay | Admitting: Internal Medicine

## 2010-06-15 VITALS — BP 110/78 | HR 56 | Temp 98.6°F | Wt 194.4 lb

## 2010-06-15 DIAGNOSIS — J019 Acute sinusitis, unspecified: Secondary | ICD-10-CM

## 2010-06-15 MED ORDER — SULFAMETHOXAZOLE-TRIMETHOPRIM 800-160 MG PO TABS: 1.0000 | ORAL_TABLET | Freq: Two times a day (BID) | ORAL | Status: AC

## 2010-06-15 MED ORDER — SULFAMETHOXAZOLE-TRIMETHOPRIM 800-160 MG PO TABS: 1.0000 | ORAL_TABLET | Freq: Two times a day (BID) | ORAL | Status: DC

## 2010-06-15 NOTE — Patient Instructions (Signed)
Please take the entire course of antibiotics or  until you're seen by Dr. Annalee Genta. If symptoms persist then a CAT scan of the sinuses would be indicated. Use Eucerin twice a day to the dry nasal mucosa. Please avoid decongestants. Drink as much nondairy fluids you  can the next 48-72 hours.

## 2010-06-15 NOTE — Progress Notes (Signed)
  Subjective:    Patient ID: Tom Gaines, male    DOB: 08-07-1945, 65 y.o.   MRN: [id number]  HPI Respiratory tract infection Onset/symptoms:04/30 as ST followed 05/01 by yellow D/C from nose Exposures (illness/environmental/extrinsic):no trigger Progression of symptoms:continuous yellow from head Treatments/response:Neti pot , Nyquil / Dayquil Present symptoms:nasal congestion slightly improved Fever/chills/sweats:only 1st week in May Frontal headache:in am slightly Facial pain:no Sore throat:only 1st week Dental pain:no Lymphadenopathy:no Wheezing/shortness of breath:no Cough/sputum/hemoptysis:only scant sputum from PNDrainage Pleuritic pain:no Associated extrinsic/allergic symptoms:itchy eyes/ sneezing:no Past medical history: Seasonal allergies/asthma:yes Smoking history:quit 1987                                                                                                                                                                         Review of Systems this is 4th episode in past 61 months;last was in April associated with  epistaxis  X 1 day.     Objective:   Physical Exam General appearance is of good health and nourishment; no acute distress or increased work of breathing is present.  No  lymphadenopathy about the head, neck, or axilla noted.   Eyes: No conjunctival inflammation or lid edema is present.   Ears:  External ear exam shows no significant lesions or deformities.  Otoscopic examination reveals clear canals, tympanic membranes are intact bilaterally without bulging, retraction, inflammation or discharge.  Nose:  External nasal examination shows no deformity or inflammation. Nasal mucosa are dry , R> L  without lesions or exudates. Minimal  septal dislocation .No obstruction to airflow.   Oral exam: Dental hygiene is good; lips and gums are healthy appearing.There is no oropharyngeal erythema or exudate noted.   Neck:  No deformities,  thyromegaly, masses, or tenderness noted.   Supple with full range of motion without pain.   Heart: Slow rate and regular rhythm. S1 and S2 normal without gallop, murmur, click, rub or other extra sounds.   Lungs:Chest clear to auscultation; no wheezes, rhonchi,rales ,or rubs present.No increased work of breathing.    Extremities:  No cyanosis, edema, or clubbing  noted    Skin: Warm & dry w/o jaundice.         Assessment & Plan:  #1 rhinosinusitis, recurrent. There is clinically no definite predisposition to this. It has been associated with epistaxis which appears to be related to nasal drying.  Plan:

## 2010-06-18 NOTE — H&P (Signed)
NAME:  Tom Gaines, Tom Gaines                          ACCOUNT NO.:  [id number]   MEDICAL RECORD NO.:  [id number]                   PATIENT TYPE:  INP   LOCATION:  0340                                 FACILITY:  [facility]   PHYSICIAN:  Titus Dubin. Alwyn Ren, M.D. [facility]         DATE OF BIRTH:  [birth year]   DATE OF ADMISSION:  07/22/2003  DATE OF DISCHARGE:                                HISTORY & PHYSICAL   Patient has been admitted to [facility] previously in 1998.   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   He was awakened the morning of June 19 at approximately 5 a.m. by a severe  sore throat.  It has been progressive over the next 48 hours.  He has been  unable to eat because of the pain and has been having increasing difficulty  controlling the oral secretions.  He finds he must expectorate into a  Kleenex rather than try to swallow due to the pain.  He has lost 3 pounds in  this 48 hours.   When seen on June 20, he was hoarse and had mild erythema of the oropharynx.  On recheck today, June 21, he has severe erythema of the oropharynx with  ulcers and inframandibular and infra-auricular tenderness to palpation.  Beta strep and monospot were negative.   PAST MEDICAL HISTORY:  Prostate cancer in 1998, for which he had surgery and  then radiation.  He also has had a microdiskectomy by Dr. Danielle Dess of  neurosurgery.  He has also had a tonsillectomy.   FAMILY HISTORY:  Positive for hypertension and diabetes in his mother.  There is no family history of cancer, heart attack, or stroke.   SOCIAL HISTORY:  He quit smoking in 1986.  He drinks socially.   He has had no chest pain or shortness of breath.  He has had a dry cough.  He denies any nasal obstruction, purulent secretions, facial pain, or  anosmia.  He has had no halitosis.  He has had associated fatigue.  He has  also had some myalgias and  arthralgias.   The symptoms began on Sunday morning after he had been at the [employer] [place] on Saturday the [place], but he had no obvious exposures to ill  people but was in large crowds.   PHYSICAL EXAMINATION:  VITAL SIGNS:  At this time, his temperature is 99.3.  Weight is 187.  It was documented to be 190 on June 20.  Pulse was 56 when  initially checked but climbed to 75 on subsequent exam.  Respiratory rate  was 18-20.  Blood pressure is 108/72.  Blood pressure was 130/84 on June 20.  HEENT:  There was slight increase in the cerumen of the canals.  Nares are  patent.  Pupils are equal and reactive to light.  He has normal extraocular  motion and normal vision.  There is some brownish pigmentation in the left  sclera inferiorly.  He has slight isocoria.  There is severe erythema with  ulcers over the posterior pharynx.  The oropharynx is decreased in diameter.  He is exquisitely tender, as noted in the infra-auricular areas bilaterally.  There is no definite organomegaly.  CARDIOVASCULAR:  He has an S4 gallop.  CHEST:  Clear.  ABDOMEN:  He has no organomegaly or lymphadenopathy.  EXTREMITIES:  Peripheral pulses are intact.  There is no edema.  There is  some __________ pattern over his legs, but skin is warm.  GENITOURINARY:  Initially deferred, as they are not germane to this  admission.   Beta strep and monospot were negative.   He will be admitted to the hospital to a private room with IV fluids and IV  pain medications and IV steroids.  He will receive an ENT consultation.  Clear liquids will be initiated.                                               Titus Dubin. Alwyn Ren, M.D. [facility]    WFH/MEDQ  D:  07/22/2003  T:  07/23/2003  Job:  [id number]   cc:   Windy Fast L. Ovidio Hanger, M.D.  [address]  [place]  [place] [address]  Fax: [phone]

## 2010-06-18 NOTE — Assessment & Plan Note (Signed)
[facility]                        [facility] OFFICE NOTE   Tom, [name]                       MRN:          [id number]  DATE:05/19/2006                            DOB:          April 08, 1945    Tom Gaines was seen for comprehensive physical examination, May 19, 2006.   He is essentially asymptomatic, except for intermittent morning cough,  productive of sputum.  This is in the context of complaint of  indigestion.  He also has some mild extrinsic rhinitis symptoms with  runny nose, for which he has used Loratadine and now generic Zyrtec.  He  denies any purulence, fever, facial pains or other signs of  rhinosinusitis.   Additionally, he has had some low back pain.  He does use back  stretching exercises and is on an exercise program typically five days a  week.  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   Additionally, he has a four-story house and walks the stairs with only  slight shortness of breath.  He has not had cardiopulmonary symptoms  otherwise with a vigorous exercise program on a StairMaster for 30  minutes.   His past history includes prostatectomy in 1998.  This was followed by  radiation.  He has also had microdiscectomy back surgery.  Remotely, he  had tonsillectomy.  In 1998, he had a colon polyp; repeat colonoscopy in  2003 was negative.  He should have followup colonoscopy in December of  this year.  He was also hospitalized  on one occasion with severe,  protracted pharyngitis.   Medical problems include plantar fasciitis, dyslipidemia.   His mother had late-onset diabetes, hypertension.  She also has macular  degeneration, recently diagnosed.  A cousin had bypass surgery.   He quit smoking in 1986.  He drinks socially.   PENICILLIN causes a rash.   He takes p.r.n. Aleve, multivitamins, Allopurinol 100 mg daily, baby  aspirin.   The remainder of the  review of systems was negative.   He is 6 feet 1.5, weight is 188.  It is down 10 pounds.  He has no  constitutional symptoms and attributes this to change in clothing as he  states he weighs himself weekly.  Pulse is 80 and regular, respiratory  rate 14 and blood pressure 126/82.  He has very minor arteriolar  narrowing on fundal exam.  Dental hygiene is excellent.  Otolaryngologic  exam is unremarkable.  Thyroid is slightly asymmetric.  There are no  nodules.  He has no lymphadenopathy in the neck or axillae.   Chest is clear with no wheezing.   He has an S4 with slight slurring, but no significant murmurs.  All  pulses are intact.  There is no edema.   No organomegaly or masses and abdomen is soft, well-muscled.   He does have crepitus of the knees with no effusion.  Range of motion is  excellent.  Negative straight leg raising is present.  He is able to sit  up from the table without help.  Deep tendon reflexes are normal.   Neuropsychiatric  exam is negative, as well.   His EKG is normal; there was physiologic poor R-wave progression, V1 to  3.  Normal negative studies, including a CBC and differential, basic  metabolic profile, hepatic profile, ESR and TSH.  His PSA is followed by  Dr. Darvin Neighbours.   His total cholesterol was mildly elevated at 207 and LDL was 111.  Based  on MMR, LipoProfile, the LDL goal is less than 130 and he is at that  goal.   For the morning congestion and cough, I recommended he use a neti pot  for nasal lavage.   There is no musculoskeletal deficit at this time; if his symptoms  persist or progress, then imaging will be pursued.   He is felt to be in excellent health and no other interventions are felt  necessary.  Stool cards will be checked for surveillance.  If these are  positive, then colonoscopy will be scheduled prior to the December  recall.  Zostavax will be administered.  Pneumonia vaccine every five  years and flu vaccine every year  would be recommended.     Titus Dubin. Alwyn Ren, MD,FACP,FCCP  Electronically Signed    WFH/MedQ  DD: 05/19/2006  DT: 05/19/2006  Job #: [id number]

## 2010-06-18 NOTE — Discharge Summary (Signed)
NAME:  Tom Gaines, Tom Gaines                          ACCOUNT NO.:  [id number]   MEDICAL RECORD NO.:  [id number]                   PATIENT TYPE:  INP   LOCATION:  0340                                 [facility]ACILITY[facility]  [facility]   PHYSICIAN:  Tom Gaines, M.D. [facility]           DATE OF BIRTH:  1945-12-30   DATE OF ADMISSION:  07/22/2003  DATE OF DISCHARGE:  07/23/2003                                 DISCHARGE SUMMARY   REASON FOR ADMISSION:  Pharyngitis with airway compromise.   HISTORY OF PRESENT ILLNESS:  Patient is a 65 year old man admitted by Dr.  Alwyn Ren on July 22, 2003 with pharyngitis and difficulty controlling  secretions.  Please refer to his dictated history and physical for details.   HOSPITAL COURSE:  Patient was admitted and treated with antibiotics, viscous  lidocaine, pain medications, and intravenous antibiotics.  By the morning of  July 23, 2003, he reports dramatic improvement in his symptoms, such as that  he is able to swallow his secretions and take p.o.  At that time, it was  mutually determined that we could hold off on neck CT scan and ENT consult,  and then he could go home.   DISCHARGE DIAGNOSES:  1. Pharyngitis with airway compromise, much better.  2. Other chronic medical problems as noted in the history and physical.   MEDICATIONS:  1. Levaquin 500 mg daily x6 more days.  2. Medrol dose pack.  3. Demerol 50 mg every 4 hours as needed.  He is prescribed #20.  4. Allegra at home, as you are taking.   DIET:  No specific restrictions.   ACTIVITY:  No specific restrictions.   FOLLOW UP:  Call Dr. Alwyn Ren if progress does not continue.  Go to the  emergency room for recurrence of airway compromise or difficulty controlling  secretions.                                               Tom Gaines, M.D. [facility]    SAE/MEDQ  D:  07/23/2003  T:  07/23/2003  Job:  [id number]   cc:   Titus Dubin. Alwyn Ren, M.D. [facility]

## 2010-07-20 ENCOUNTER — Other Ambulatory Visit: Payer: Self-pay | Admitting: Internal Medicine

## 2010-08-02 ENCOUNTER — Other Ambulatory Visit (INDEPENDENT_AMBULATORY_CARE_PROVIDER_SITE_OTHER): Payer: Medicare Other

## 2010-08-02 ENCOUNTER — Telehealth: Payer: Self-pay

## 2010-08-02 DIAGNOSIS — T887XXA Unspecified adverse effect of drug or medicament, initial encounter: Secondary | ICD-10-CM

## 2010-08-02 DIAGNOSIS — M109 Gout, unspecified: Secondary | ICD-10-CM

## 2010-08-02 LAB — BUN: BUN: 17 mg/dL (ref 6–23)

## 2010-08-02 LAB — URIC ACID: Uric Acid, Serum: 5.9 mg/dL (ref 4.0–7.8)

## 2010-08-02 NOTE — Telephone Encounter (Signed)
Spoke with patient and scheduled appointment for [facility] this afternoon.

## 2010-08-02 NOTE — Telephone Encounter (Signed)
Message copied by Edgardo Roys on Mon Aug 02, 2010  8:38 AM ------      Message from: Pecola Lawless      Created: Sun Aug 01, 2010 11:01 AM       I reviewed his labs for past year;  uric acid , BUN ,creat should be  done to verify no negative kidney effects & that dose does not need to be increased . Goal = uric acid < 6, ideally < 5 to prevent gout. I have his refill request for All[facility]ol [facility]

## 2010-08-03 ENCOUNTER — Telehealth: Payer: Self-pay

## 2010-08-03 MED ORDER — ALLOPURINOL 100 MG PO TABS: 100.0000 mg | ORAL_TABLET | Freq: Every day | ORAL | Status: DC

## 2010-08-03 NOTE — Telephone Encounter (Signed)
Message copied by Edgardo Roys on Tue Aug 03, 2010  4:20 PM ------      Message from: Pecola Lawless      Created: Tue Aug 03, 2010 12:38 PM       I have his mail order form for Allopurinol ; can U do mail order for [phone]

## 2010-08-03 NOTE — Telephone Encounter (Signed)
RX sent to [facility]

## 2011-02-03 ENCOUNTER — Ambulatory Visit (INDEPENDENT_AMBULATORY_CARE_PROVIDER_SITE_OTHER): Payer: Medicare Other | Admitting: Internal Medicine

## 2011-02-03 ENCOUNTER — Other Ambulatory Visit: Payer: Self-pay | Admitting: Internal Medicine

## 2011-02-03 ENCOUNTER — Encounter: Payer: Self-pay | Admitting: Internal Medicine

## 2011-02-03 VITALS — BP 136/88 | Temp 97.8°F | Resp 12 | Ht 71.5 in | Wt 189.0 lb

## 2011-02-03 DIAGNOSIS — J0100 Acute maxillary sinusitis, unspecified: Secondary | ICD-10-CM

## 2011-02-03 DIAGNOSIS — E785 Hyperlipidemia, unspecified: Secondary | ICD-10-CM

## 2011-02-03 DIAGNOSIS — D126 Benign neoplasm of colon, unspecified: Secondary | ICD-10-CM

## 2011-02-03 DIAGNOSIS — Z0000 Encounter for general adult medical examination without abnormal findings: Secondary | ICD-10-CM | POA: Diagnosis not present

## 2011-02-03 DIAGNOSIS — E7800 Pure hypercholesterolemia, unspecified: Secondary | ICD-10-CM

## 2011-02-03 DIAGNOSIS — Z833 Family history of diabetes mellitus: Secondary | ICD-10-CM

## 2011-02-03 DIAGNOSIS — J019 Acute sinusitis, unspecified: Secondary | ICD-10-CM

## 2011-02-03 MED ORDER — FLUTICASONE PROPIONATE 50 MCG/ACT NA SUSP: 1.0000 | Freq: Two times a day (BID) | NASAL | Status: DC | PRN

## 2011-02-03 MED ORDER — SULFAMETHOXAZOLE-TRIMETHOPRIM 800-160 MG PO TABS: 1.0000 | ORAL_TABLET | Freq: Two times a day (BID) | ORAL | Status: AC

## 2011-02-03 NOTE — Progress Notes (Signed)
Subjective:    Patient ID: Tom Gaines, male    DOB: Jun 28, 1945, 66 y.o.   MRN: [id number]  HPI Medicare Wellness Visit:  The following psychosocial & medical history were reviewed as required by Medicare.   Social history: caffeine: 2 large cups/ day , alcohol:  rarely ,  tobacco use : quit 1987  & exercise : 5X/ week.   Home & personal  safety / fall risk: no issues, activities of daily living: no limitations , seatbelt use : yes , and smoke alarm employment : yes .  Power of Attorney/Living Will status : in place  Vision ( as recorded per Nurse) & Hearing  evaluation : Optician seen 12/12, no issues. Whisper heard@ 6 ft Orientation :oriented X 3 , memory & recall :good, spelling  testing: good ,and mood & affect : normal . Depression / anxiety: no issues Travel history : last 2006 [place] , immunization status :up to date , transfusion history:  no, and preventive health surveillance ( colonoscopies, BMD , etc as per protocol/ SOC): up to date, Dental care:  Every 6 months . Chart reviewed &  Updated. Active issues reviewed & addressed.       Review of Systems Respiratory tract infection Onset/symptoms:12/26 as head congestion Exposures (illness/environmental/extrinsic):ill grandchildren Progression of symptoms:tonasal  purulence Treatments/response:Neti pot, Mucinex with some benefit Present symptoms: Fever/chills/sweats:no Frontal headache:no Facial pain:pressure Sore throat:dry & sore Dental pain:no Lymphadenopathy:no Wheezing/shortness of breath:no Cough/sputum/hemoptysis:scant yellow as of today           Objective:   Physical Exam Gen.: thin but healthy and well-nourished in appearance. Alert, appropriate and cooperative throughout exam. Head: Normocephalic without obvious abnormalities;  pattern alopecia . Goatee Eyes: No corneal or conjunctival inflammation noted. Pupils equal round reactive to light and accommodation.  Extraocular motion intact. Scleral  hyperpigmentation OS Ears: External  ear exam reveals no significant lesions or deformities. Canals clear .TMs normal.  Nose: External nasal exam reveals no deformity or inflammation. Nasal mucosa are pink and moist. No lesions or exudates noted. Hyponasal speech Mouth: Oral mucosa and oropharynx reveal no lesions or exudates. Teeth in good repair. Neck: No deformities, masses, or tenderness noted. Range of motion &. Thyroid normal. Lungs: Normal respiratory effort; chest expands symmetrically. Lungs are clear to auscultation without rales, wheezes, or increased work of breathing. Heart: Normal rate and rhythm. Normal S1 and S2. No gallop, click, or rub. S4 w/o murmur. Abdomen: Bowel sounds normal; abdomen soft and nontender. No masses, organomegaly or hernias noted. Genitalia: Dr Earlene Plater   .                                                                                   Musculoskeletal/extremities: No deformity or scoliosis noted of  the thoracic or lumbar spine. No clubbing, cyanosis, edema, or deformity noted. Range of motion  normal .Tone & strength  normal.Joints normal. Nail health  good. Vascular: Carotid, radial artery, dorsalis pedis and  posterior tibial pulses are full and equal. No bruits present. Neurologic: Alert and oriented x3. Deep tendon reflexes symmetrical and normal.          Skin: Intact without suspicious lesions or rashes. Lymph:  No cervical, axillary lymphadenopathy present. Psych: Mood and affect are normal. Normally interactive                                                                                          Assessment & Plan:  #1 Medicare Wellness Exam; criteria met ; data entered #2 rhinosinusistis #3Problem List reviewed ; Assessment/ Recommendations made Plan: see Orders

## 2011-02-03 NOTE — Patient Instructions (Addendum)
Blood Pressure Goal  Ideally is an AVERAGE < 135/85. This AVERAGE should be calculated from @ least 5-7 BP readings taken @ different times of day on different days of week. You should not respond to isolated BP readings , but rather the AVERAGE for that week . Plain Mucinex for thick secretions ;force NON dairy fluids . Use a Neti pot daily as needed for sinus congestion .Flonase 1 spray in each nostril twice a day as needed. Use the "crossover" technique as discussed Please  schedule fasting Labs : BMET,Lipids, hepatic panel, CBC & dif, TSH. PLEASE BRING THESE INSTRUCTIONS TO FOLLOW UP  LAB APPOINTMENT.This will guarantee correct labs are drawn, eliminating need for repeat blood sampling ( needle sticks ! ). Diagnoses /Codes: 461.0, 272.4, V18.0,211.3, elevated BP w/o diagnosis of HTN

## 2011-02-04 ENCOUNTER — Other Ambulatory Visit: Payer: Self-pay | Admitting: Internal Medicine

## 2011-02-04 ENCOUNTER — Other Ambulatory Visit (INDEPENDENT_AMBULATORY_CARE_PROVIDER_SITE_OTHER): Payer: Medicare Other

## 2011-02-04 DIAGNOSIS — E7800 Pure hypercholesterolemia, unspecified: Secondary | ICD-10-CM

## 2011-02-04 DIAGNOSIS — E785 Hyperlipidemia, unspecified: Secondary | ICD-10-CM

## 2011-02-04 DIAGNOSIS — J0100 Acute maxillary sinusitis, unspecified: Secondary | ICD-10-CM

## 2011-02-04 DIAGNOSIS — Z833 Family history of diabetes mellitus: Secondary | ICD-10-CM | POA: Diagnosis not present

## 2011-02-04 DIAGNOSIS — D126 Benign neoplasm of colon, unspecified: Secondary | ICD-10-CM

## 2011-02-04 LAB — LIPID PANEL
HDL: 47.7 mg/dL (ref >39.00)
Total CHOL/HDL Ratio: 5
VLDL: 22.2 mg/dL (ref 0.0–40.0)

## 2011-02-04 LAB — CBC WITH DIFFERENTIAL/PLATELET
Basophils Relative: 0.5 % (ref 0.0–3.0)
Eosinophils Relative: 2.2 % (ref 0.0–5.0)
HCT: 44.5 % (ref 39.0–52.0)
Lymphs Abs: 1.9 10*3/uL (ref 0.7–4.0)
MCV: 91.5 fl (ref 78.0–100.0)
Monocytes Absolute: 0.9 10*3/uL (ref 0.1–1.0)
Monocytes Relative: 11.2 % (ref 3.0–12.0)
Neutrophils Relative %: 62.8 % (ref 43.0–77.0)
RBC: 4.87 Mil/uL (ref 4.22–5.81)
WBC: 8.3 10*3/uL (ref 4.5–10.5)

## 2011-02-04 LAB — LDL CHOLESTEROL, DIRECT: Direct LDL: 151.6 mg/dL

## 2011-02-04 LAB — BASIC METABOLIC PANEL
BUN: 19 mg/dL (ref 6–23)
CO2: 29 mEq/L (ref 19–32)
Chloride: 103 mEq/L (ref 96–112)
GFR: 59.27 mL/min — ABNORMAL LOW (ref >60.00)
Glucose, Bld: 90 mg/dL (ref 70–99)
Potassium: 5 mEq/L (ref 3.5–5.1)
Sodium: 139 mEq/L (ref 135–145)

## 2011-02-04 LAB — HEPATIC FUNCTION PANEL
Albumin: 4.5 g/dL (ref 3.5–5.2)
Total Bilirubin: 0.6 mg/dL (ref 0.3–1.2)

## 2011-03-07 DIAGNOSIS — C61 Malignant neoplasm of prostate: Secondary | ICD-10-CM | POA: Diagnosis not present

## 2011-03-28 DIAGNOSIS — N529 Male erectile dysfunction, unspecified: Secondary | ICD-10-CM | POA: Insufficient documentation

## 2011-04-26 DIAGNOSIS — L820 Inflamed seborrheic keratosis: Secondary | ICD-10-CM | POA: Diagnosis not present

## 2011-06-07 ENCOUNTER — Other Ambulatory Visit: Payer: Self-pay | Admitting: Internal Medicine

## 2011-06-07 ENCOUNTER — Other Ambulatory Visit (INDEPENDENT_AMBULATORY_CARE_PROVIDER_SITE_OTHER): Payer: Medicare Other

## 2011-06-07 LAB — AST: AST: 36 U/L (ref 0–37)

## 2011-06-10 ENCOUNTER — Encounter: Payer: Self-pay | Admitting: *Deleted

## 2011-06-30 DIAGNOSIS — H31009 Unspecified chorioretinal scars, unspecified eye: Secondary | ICD-10-CM | POA: Diagnosis not present

## 2011-08-14 ENCOUNTER — Other Ambulatory Visit: Payer: Self-pay | Admitting: Internal Medicine

## 2011-09-06 ENCOUNTER — Encounter: Payer: Self-pay | Admitting: Internal Medicine

## 2011-09-06 ENCOUNTER — Ambulatory Visit (INDEPENDENT_AMBULATORY_CARE_PROVIDER_SITE_OTHER): Payer: Medicare Other | Admitting: Internal Medicine

## 2011-09-06 VITALS — BP 122/74 | HR 63 | Temp 97.8°F | Wt 189.0 lb

## 2011-09-06 DIAGNOSIS — M1990 Unspecified osteoarthritis, unspecified site: Secondary | ICD-10-CM | POA: Diagnosis not present

## 2011-09-06 DIAGNOSIS — M5430 Sciatica, unspecified side: Secondary | ICD-10-CM

## 2011-09-06 DIAGNOSIS — M109 Gout, unspecified: Secondary | ICD-10-CM

## 2011-09-06 DIAGNOSIS — M5432 Sciatica, left side: Secondary | ICD-10-CM

## 2011-09-06 LAB — URIC ACID: Uric Acid, Serum: 7.2 mg/dL (ref 4.0–7.8)

## 2011-09-06 NOTE — Patient Instructions (Addendum)
The best exercises for the low back include freestyle swimming, stretch aerobics, and yoga.  Please try to go on My Chart within the next 24 hours to allow me to release the results directly to you.

## 2011-09-06 NOTE — Progress Notes (Signed)
  Subjective:    Patient ID: Tom Gaines, male    DOB: Mar 16, 1945, 66 y.o.   MRN: [id number]  HPI He describes dull pain from the left hip to the left popliteal space after driving for approximately 90 minutes. The symptoms will improve with standing up and stretching.  He has no associated limb weakness, numbness and tingling, or incontinence of urine or stool  In 2002 he had disc surgery for similar symptoms on the right by Dr. Danielle Dess.    Review of Systems He has intermittent discomfort at the base of the R great toe. There can be some color changes well. This is worse when he wears shoes rather than sandals. He questions the possibility of gout as cause     Objective:   Physical Exam General appearance is one of good health and nourishment w/o distress. Abdomen: bowel sounds normal, soft and non-tender without masses, organomegaly or hernias noted.  No guarding or rebound . No AAA or bruits. Muscle skeletal: No  significant malalignment. Able to lie flat and sit up without help. Straight leg raising negative bilaterally. Strength and tone are excellent. No significant discomfort to percussion over the affected area of the spine No significant joint or nail abnormalities are present. Slight hypertrophy of the right first MTP joint with slight tenderness to palpation Neuro: Gait is normal to include heel and toe walking. Deep tendon reflexes reveal mild asymmetry; right knee 1.5+; left 1+. Skin:Warm & dry.  Intact without suspicious lesions or rashes . Lymphatic: No lymphadenopathy is noted about the head, neck, axilla  areas.            Assessment & Plan:  #1 Sciatica; no acute disc process #2 DJD R MTP, doubt acute gout

## 2011-09-07 ENCOUNTER — Encounter: Payer: Self-pay | Admitting: Internal Medicine

## 2011-09-08 ENCOUNTER — Encounter: Payer: Self-pay | Admitting: Internal Medicine

## 2011-09-08 MED ORDER — ALLOPURINOL 100 MG PO TABS: 200.0000 mg | ORAL_TABLET | Freq: Every day | ORAL | Status: DC

## 2011-09-08 NOTE — Addendum Note (Signed)
Addended byDuaine Dredge, Jordyn Hofacker L on: 09/08/2011 11:15 AM   Modules accepted: Orders

## 2011-09-22 ENCOUNTER — Encounter: Payer: Self-pay | Admitting: Internal Medicine

## 2011-09-26 DIAGNOSIS — N529 Male erectile dysfunction, unspecified: Secondary | ICD-10-CM | POA: Diagnosis not present

## 2011-09-26 DIAGNOSIS — C61 Malignant neoplasm of prostate: Secondary | ICD-10-CM | POA: Diagnosis not present

## 2011-10-04 ENCOUNTER — Ambulatory Visit (INDEPENDENT_AMBULATORY_CARE_PROVIDER_SITE_OTHER): Payer: Medicare Other | Admitting: Internal Medicine

## 2011-10-04 ENCOUNTER — Encounter: Payer: Self-pay | Admitting: Internal Medicine

## 2011-10-04 ENCOUNTER — Ambulatory Visit (INDEPENDENT_AMBULATORY_CARE_PROVIDER_SITE_OTHER)
Admission: RE | Admit: 2011-10-04 | Discharge: 2011-10-04 | Disposition: A | Discharge status: Home / Self Care | Payer: Medicare Other | Source: Ambulatory Visit | Attending: Internal Medicine | Admitting: Internal Medicine

## 2011-10-04 VITALS — BP 124/78 | HR 71 | Temp 98.5°F | Wt 193.8 lb

## 2011-10-04 DIAGNOSIS — R079 Chest pain, unspecified: Secondary | ICD-10-CM | POA: Diagnosis not present

## 2011-10-04 DIAGNOSIS — S298XXA Other specified injuries of thorax, initial encounter: Secondary | ICD-10-CM

## 2011-10-04 DIAGNOSIS — S299XXA Unspecified injury of thorax, initial encounter: Secondary | ICD-10-CM

## 2011-10-04 MED ORDER — TRAMADOL HCL 50 MG PO TABS: 50.0000 mg | ORAL_TABLET | Freq: Three times a day (TID) | ORAL | Status: AC | PRN

## 2011-10-04 NOTE — Patient Instructions (Addendum)
Order for x-rays entered into  the computer; these will be performed at [address] across from [facility]. No appointment is necessary. 
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
## 2011-11-07 ENCOUNTER — Encounter: Payer: Self-pay | Admitting: Internal Medicine

## 2011-11-08 ENCOUNTER — Encounter: Payer: Self-pay | Admitting: Internal Medicine

## 2011-11-09 ENCOUNTER — Other Ambulatory Visit: Payer: Self-pay

## 2011-11-09 DIAGNOSIS — M109 Gout, unspecified: Secondary | ICD-10-CM

## 2011-11-09 MED ORDER — ALLOPURINOL 100 MG PO TABS: 200.0000 mg | ORAL_TABLET | Freq: Every day | ORAL | Status: DC

## 2011-11-14 DIAGNOSIS — R972 Elevated prostate specific antigen [PSA]: Secondary | ICD-10-CM | POA: Diagnosis not present

## 2011-11-18 ENCOUNTER — Other Ambulatory Visit (INDEPENDENT_AMBULATORY_CARE_PROVIDER_SITE_OTHER): Payer: Medicare Other

## 2011-11-18 DIAGNOSIS — M109 Gout, unspecified: Secondary | ICD-10-CM | POA: Diagnosis not present

## 2011-11-18 LAB — URIC ACID: Uric Acid, Serum: 6.6 mg/dL (ref 4.0–7.8)

## 2011-11-30 ENCOUNTER — Encounter: Payer: Self-pay | Admitting: Internal Medicine

## 2011-12-05 ENCOUNTER — Other Ambulatory Visit: Payer: Self-pay | Admitting: Internal Medicine

## 2011-12-06 ENCOUNTER — Encounter: Payer: Self-pay | Admitting: Gastroenterology

## 2011-12-07 ENCOUNTER — Encounter: Payer: Self-pay | Admitting: Internal Medicine

## 2011-12-14 ENCOUNTER — Encounter: Payer: Self-pay | Admitting: Internal Medicine

## 2011-12-14 DIAGNOSIS — M109 Gout, unspecified: Secondary | ICD-10-CM

## 2011-12-14 MED ORDER — ALLOPURINOL 300 MG PO TABS: 300.0000 mg | ORAL_TABLET | Freq: Every day | ORAL | Status: DC

## 2011-12-14 NOTE — Telephone Encounter (Signed)
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 He had enough to take 3 of the 100 mg Allopurinol. Rx mailed    KP

## 2012-01-03 ENCOUNTER — Encounter: Payer: Self-pay | Admitting: Gastroenterology

## 2012-01-04 DIAGNOSIS — L570 Actinic keratosis: Secondary | ICD-10-CM | POA: Diagnosis not present

## 2012-01-09 ENCOUNTER — Encounter: Payer: Self-pay | Admitting: Gastroenterology

## 2012-01-13 ENCOUNTER — Ambulatory Visit (AMBULATORY_SURGERY_CENTER): Payer: Medicare Other | Admitting: *Deleted

## 2012-01-13 VITALS — Ht 72.0 in | Wt 196.6 lb

## 2012-01-13 DIAGNOSIS — Z1211 Encounter for screening for malignant neoplasm of colon: Secondary | ICD-10-CM

## 2012-01-13 MED ORDER — MOVIPREP 100 G PO SOLR: ORAL | Status: DC

## 2012-02-01 HISTORY — PX: OTHER SURGICAL HISTORY: SHX169

## 2012-02-02 ENCOUNTER — Telehealth: Payer: Self-pay | Admitting: Gastroenterology

## 2012-02-02 DIAGNOSIS — Z1211 Encounter for screening for malignant neoplasm of colon: Secondary | ICD-10-CM

## 2012-02-02 MED ORDER — MOVIPREP 100 G PO SOLR: ORAL | Status: DC

## 2012-02-02 NOTE — Telephone Encounter (Signed)
Pt's rx for moviprep sent to pharmacy

## 2012-02-06 ENCOUNTER — Encounter: Payer: Self-pay | Admitting: Gastroenterology

## 2012-02-06 ENCOUNTER — Ambulatory Visit (AMBULATORY_SURGERY_CENTER): Payer: Medicare Other | Admitting: Gastroenterology

## 2012-02-06 VITALS — BP 107/76 | HR 59 | Temp 98.0°F | Resp 14 | Ht 72.0 in | Wt 196.0 lb

## 2012-02-06 DIAGNOSIS — D126 Benign neoplasm of colon, unspecified: Secondary | ICD-10-CM

## 2012-02-06 DIAGNOSIS — Z1211 Encounter for screening for malignant neoplasm of colon: Secondary | ICD-10-CM

## 2012-02-06 DIAGNOSIS — Z860100 Personal history of colon polyps, unspecified: Secondary | ICD-10-CM

## 2012-02-06 MED ORDER — SODIUM CHLORIDE 0.9 % IV SOLN: 500.0000 mL | INTRAVENOUS | Status: DC

## 2012-02-06 NOTE — Progress Notes (Signed)
Called to room to assist during endoscopic procedure.  Patient ID and intended procedure confirmed with present staff. Received instructions for my participation in the procedure from the performing physician.  

## 2012-02-06 NOTE — Patient Instructions (Signed)
YOU HAD AN ENDOSCOPIC PROCEDURE TODAY AT THE [facility]: Refer to the procedure report that was given to you for any specific questions about what was found during the examination.  If the procedure report does not answer your questions, please call your gastroenterologist to clarify.  If you requested that your care partner not be given the details of your procedure findings, then the procedure report has been included in a sealed envelope for you to review at your convenience later.  YOU SHOULD EXPECT: Some feelings of bloating in the abdomen. Passage of more gas than usual.  Walking can help get rid of the air that was put into your GI tract during the procedure and reduce the bloating. If you had a lower endoscopy (such as a colonoscopy or flexible sigmoidoscopy) you may notice spotting of blood in your stool or on the toilet paper. If you underwent a bowel prep for your procedure, then you may not have a normal bowel movement for a few days.  DIET: Your first meal following the procedure should be a light meal and then it is ok to progress to your normal diet.  A half-sandwich or bowl of soup is an example of a good first meal.  Heavy or fried foods are harder to digest and may make you feel nauseous or bloated.  Likewise meals heavy in dairy and vegetables can cause extra gas to form and this can also increase the bloating.  Drink plenty of fluids but you should avoid alcoholic beverages for 24 hours.  ACTIVITY: Your care partner should take you home directly after the procedure.  You should plan to take it easy, moving slowly for the rest of the day.  You can resume normal activity the day after the procedure however you should NOT DRIVE or use heavy machinery for 24 hours (because of the sedation medicines used during the test).    SYMPTOMS TO REPORT IMMEDIATELY: A gastroenterologist can be reached at any hour.  During normal business hours, 8:30 AM to 5:00 PM Monday through Friday,  call [phone].  After hours and on weekends, please call the GI answering service at [phone] who will take a message and have the physician on call contact you.   Following lower endoscopy (colonoscopy or flexible sigmoidoscopy):  Excessive amounts of blood in the stool  Significant tenderness or worsening of abdominal pains  Swelling of the abdomen that is new, acute  Fever of 100F or higher    FOLLOW UP: If any biopsies were taken you will be contacted by phone or by letter within the next 1-3 weeks.  Call your gastroenterologist if you have not heard about the biopsies in 3 weeks.  Our staff will call the home number listed on your records the next business day following your procedure to check on you and address any questions or concerns that you may have at that time regarding the information given to you following your procedure. This is a courtesy call and so if there is no answer at the home number and we have not heard from you through the emergency physician on call, we will assume that you have returned to your regular daily activities without incident.  SIGNATURES/CONFIDENTIALITY: You and/or your care partner have signed paperwork which will be entered into your electronic medical record.  These signatures attest to the fact that that the information above on your After Visit Summary has been reviewed and is understood.  Full responsibility of the confidentiality   of this discharge information lies with you and/or your care-partner.    INFORMATION ON POLYPS ,DIVERTICULOSIS, HEMORRHOIDS, &HIGH FIBER DIET GIVEN TO YOU TODAY

## 2012-02-06 NOTE — Progress Notes (Signed)
Patient did not experience any of the following events: a burn prior to discharge; a fall within the facility; wrong site/side/patient/procedure/implant event; or a hospital transfer or hospital admission upon discharge from the facility. (G8907) Patient did not have preoperative order for IV antibiotic SSI prophylaxis. (G8918)  

## 2012-02-06 NOTE — Op Note (Signed)
[facility] [address] [place], [address]   COLONOSCOPY PROCEDURE REPORT  PATIENT: Tom Gaines, Tom Gaines  MR#: [id number] BIRTHDATE: May 17, 1945 , 66  yrs. old GENDER: Male ENDOSCOPIST: Meryl Dare, MD, [facility] PROCEDURE DATE:  02/06/2012 PROCEDURE:   Colonoscopy with snare polypectomy ASA CLASS:   Class II INDICATIONS:Patient's personal history of colon polyps and 1 cm cecal hyperplastic polyp, 02/2007. MEDICATIONS: MAC sedation, administered by CRNA and propofol (Diprivan) 300mg  IV DESCRIPTION OF PROCEDURE:   After the risks benefits and alternatives of the procedure were thoroughly explained, informed consent was obtained.  A digital rectal exam revealed no abnormalities of the rectum.   The LB CF-[id number]  endoscope was introduced through the anus and advanced to the cecum, which was identified by both the appendix and ileocecal valve. No adverse events experienced.   The quality of the prep was good, using MoviPrep  The instrument was then slowly withdrawn as the colon was fully examined.  COLON FINDINGS: Two sessile polyps measuring 6-8 mm in size were found at the cecum.  A polypectomy was performed with a cold snare. The resection was complete and the polyp tissue was completely retrieved.  Moderate diverticulosis was noted in the sigmoid colon and descending colon.   The colon was otherwise normal.  There was no diverticulosis, inflammation, polyps or cancers unless previously stated.  Retroflexed views revealed internal hemorrhoids. The time to cecum=3 minutes 57 seconds.  Withdrawal time=12 minutes 24 seconds.  The scope was withdrawn and the procedure completed.  COMPLICATIONS: There were no complications.  ENDOSCOPIC IMPRESSION: 1.   Two sessile polyps measuring 6-8 mm at the cecum; polypectomy performed with a cold snare 2.   Moderate diverticulosis was noted in the sigmoid colon and descending colon 3.   Internal  hemorrhoids  RECOMMENDATIONS: 1.  Await pathology results 2.  High fiber diet with liberal fluid intake. 3.  Repeat Colonoscopy in 5 years.  eSigned:  Meryl Dare, MD, [facility] 02/06/2012 3:32 PM

## 2012-02-07 ENCOUNTER — Telehealth: Payer: Self-pay

## 2012-02-07 NOTE — Telephone Encounter (Signed)
  Follow up Call-  Call back number 02/06/2012  Post procedure Call Back phone  # [phone]  Permission to leave phone message Yes     Patient questions:  Do you have a fever, pain , or abdominal swelling? no Pain Score  0 *  Have you tolerated food without any problems? yes  Have you been able to return to your normal activities? yes  Do you have any questions about your discharge instructions: Diet   no Medications  no Follow up visit  no  Do you have questions or concerns about your Care? no  Actions: * If pain score is 4 or above: No action needed, pain <4.

## 2012-02-09 ENCOUNTER — Encounter: Payer: Self-pay | Admitting: Gastroenterology

## 2012-02-22 ENCOUNTER — Other Ambulatory Visit (INDEPENDENT_AMBULATORY_CARE_PROVIDER_SITE_OTHER): Payer: Medicare Other

## 2012-02-22 DIAGNOSIS — M109 Gout, unspecified: Secondary | ICD-10-CM | POA: Diagnosis not present

## 2012-02-22 LAB — URIC ACID: Uric Acid, Serum: 6.1 mg/dL (ref 4.0–7.8)

## 2012-02-28 ENCOUNTER — Encounter: Payer: Self-pay | Admitting: Internal Medicine

## 2012-03-13 ENCOUNTER — Encounter: Payer: Self-pay | Admitting: Internal Medicine

## 2012-03-13 ENCOUNTER — Ambulatory Visit (INDEPENDENT_AMBULATORY_CARE_PROVIDER_SITE_OTHER)
Admission: RE | Admit: 2012-03-13 | Discharge: 2012-03-13 | Disposition: A | Discharge status: Home / Self Care | Payer: Medicare Other | Source: Ambulatory Visit | Attending: Internal Medicine | Admitting: Internal Medicine

## 2012-03-13 ENCOUNTER — Ambulatory Visit (INDEPENDENT_AMBULATORY_CARE_PROVIDER_SITE_OTHER): Payer: Medicare Other | Admitting: Internal Medicine

## 2012-03-13 DIAGNOSIS — M79609 Pain in unspecified limb: Secondary | ICD-10-CM

## 2012-03-13 DIAGNOSIS — R202 Paresthesia of skin: Secondary | ICD-10-CM

## 2012-03-13 DIAGNOSIS — Z23 Encounter for immunization: Secondary | ICD-10-CM

## 2012-03-13 DIAGNOSIS — M109 Gout, unspecified: Secondary | ICD-10-CM | POA: Diagnosis not present

## 2012-03-13 DIAGNOSIS — M19079 Primary osteoarthritis, unspecified ankle and foot: Secondary | ICD-10-CM | POA: Diagnosis not present

## 2012-03-13 DIAGNOSIS — R209 Unspecified disturbances of skin sensation: Secondary | ICD-10-CM | POA: Diagnosis not present

## 2012-03-13 NOTE — Progress Notes (Signed)
  Subjective:    Patient ID: Tom Gaines, male    DOB: 07-13-1945, 67 y.o.   MRN: [id number]  HPI #1The toe pain began yearsago in  R great toe @ the MTP joint on without associated injury or trigger. It is described as aching up to level 5 maximally. The pain does not radiate. The discomfort last seconds . It is exacerbated by squatting , walking or any " angling" of  Toe.  Associated signs/symptoms include intermittent redness w/o swelling or stiffness; skin ; or temperature change. The pain was  treated with increase in Allopurinol to 300 mg daily. NSAIDS taken for back.    Past medical history/family history/social history were all reviewed and updated. Pertinent data: podagra R great toe.   Films of the right foot 11/29/2002 revealed only soft tissue swelling centered at the right fifth metatarsophalangeal joint. The typical radiographic features or gout were not seen. He has seen a rheumatologist to R/O RA.  His last uric acid level was 6.1 on 02/22/12.           Review of Systems  Constitutional: no fever, chills, sweats, change in weight  Musculoskeletal:no  muscle cramps or pain Skin:no visible or palpable rash but sensation of "road burn " R lateral knee with flexion. Neuro: no weakness; incontinence (stool/urine); numbness and tingling Heme:no lymphadenopathy; abnormal bruising or bleeding       Objective:   Physical Exam  Gen.: Healthy and well-nourished in appearance. Alert, appropriate and cooperative throughout exam.  Neck: No deformities, masses, or tenderness noted. Range of motion slightly decreased laterally                            Musculoskeletal/extremities: No deformity or scoliosis noted of  the thoracic or lumbar spine. No clubbing, cyanosis, edema, or significant extremity  deformity noted except enlarged R MTP joint. Range of motion normal .Tone & strength  normal. Nail health good. Able to lie down & sit up w/o help. Negative SLR bilaterally. There  is no significant tenderness to percussion or range of motion of the right great toe. Vascular: Dorsalis pedis and  posterior tibial pulses are full and equal. No bruits present. Neurologic: Alert and oriented x3. Deep tendon reflexes symmetrical and normal. Rhomberg & finger to nose testing normal.Gait including heel & toe walking normal.        Skin: Intact without suspicious lesions or rashes. No erythema over the right great toe at the MTP joint. Psych: Mood and affect are normal. Normally interactive                                                                                       Assessment & Plan:  #1 positional, activity related pain right great MTP joint. This suggests pain related to degenerative joint disease rather than gout  #2 positional paresthesias right knee. This most likely relates to superficial nerve compression with the flexion.  Plan: See orders and recommendations

## 2012-03-13 NOTE — Addendum Note (Signed)
Addended by: Maurice Small on: 03/13/2012 04:11 PM   Modules accepted: Orders

## 2012-03-13 NOTE — Patient Instructions (Addendum)
Use an anti-inflammatory cream such as Aspercreme or Zostrix cream twice a day to the R great toe as needed. In lieu of this warm moist compresses or  hot water bottle can be used. Do not apply ice . Do not take the prophylactic aspirin those days you take Aleve for your back. The Aleve would negate the benefit of taking prophylactic aspirin. Order for x-rays entered into  the computer; these will be performed at [address] [address]. across from [facility]. No appointment is necessary.

## 2012-03-21 ENCOUNTER — Encounter: Payer: Self-pay | Admitting: Internal Medicine

## 2012-04-16 DIAGNOSIS — N529 Male erectile dysfunction, unspecified: Secondary | ICD-10-CM | POA: Diagnosis not present

## 2012-05-10 ENCOUNTER — Encounter: Payer: Self-pay | Admitting: Internal Medicine

## 2012-05-11 ENCOUNTER — Other Ambulatory Visit: Payer: Self-pay | Admitting: Internal Medicine

## 2012-05-11 ENCOUNTER — Encounter: Payer: Self-pay | Admitting: Internal Medicine

## 2012-05-11 DIAGNOSIS — M7541 Impingement syndrome of right shoulder: Secondary | ICD-10-CM

## 2012-05-14 ENCOUNTER — Encounter: Payer: Self-pay | Admitting: Internal Medicine

## 2012-05-15 ENCOUNTER — Ambulatory Visit: Payer: Medicare Other | Admitting: Rehabilitation

## 2012-05-16 DIAGNOSIS — M205X9 Other deformities of toe(s) (acquired), unspecified foot: Secondary | ICD-10-CM | POA: Diagnosis not present

## 2012-05-17 ENCOUNTER — Encounter: Payer: Self-pay | Admitting: Internal Medicine

## 2012-06-04 ENCOUNTER — Ambulatory Visit: Payer: Medicare Other | Attending: Internal Medicine | Admitting: Physical Therapy

## 2012-06-04 ENCOUNTER — Ambulatory Visit: Payer: Medicare Other | Admitting: Physical Therapy

## 2012-06-04 DIAGNOSIS — IMO0001 Reserved for inherently not codable concepts without codable children: Secondary | ICD-10-CM | POA: Diagnosis not present

## 2012-06-04 DIAGNOSIS — R293 Abnormal posture: Secondary | ICD-10-CM | POA: Diagnosis not present

## 2012-06-04 DIAGNOSIS — M25519 Pain in unspecified shoulder: Secondary | ICD-10-CM | POA: Insufficient documentation

## 2012-06-12 ENCOUNTER — Ambulatory Visit: Payer: Medicare Other | Admitting: Rehabilitation

## 2012-06-12 DIAGNOSIS — R293 Abnormal posture: Secondary | ICD-10-CM | POA: Diagnosis not present

## 2012-06-12 DIAGNOSIS — M25519 Pain in unspecified shoulder: Secondary | ICD-10-CM | POA: Diagnosis not present

## 2012-06-12 DIAGNOSIS — IMO0001 Reserved for inherently not codable concepts without codable children: Secondary | ICD-10-CM | POA: Diagnosis not present

## 2012-06-20 ENCOUNTER — Ambulatory Visit: Payer: Medicare Other | Admitting: Rehabilitation

## 2012-06-20 DIAGNOSIS — IMO0001 Reserved for inherently not codable concepts without codable children: Secondary | ICD-10-CM | POA: Diagnosis not present

## 2012-06-20 DIAGNOSIS — R293 Abnormal posture: Secondary | ICD-10-CM | POA: Diagnosis not present

## 2012-06-20 DIAGNOSIS — M25519 Pain in unspecified shoulder: Secondary | ICD-10-CM | POA: Diagnosis not present

## 2012-07-02 ENCOUNTER — Ambulatory Visit: Payer: Medicare Other | Attending: Internal Medicine | Admitting: Physical Therapy

## 2012-07-02 DIAGNOSIS — IMO0001 Reserved for inherently not codable concepts without codable children: Secondary | ICD-10-CM | POA: Insufficient documentation

## 2012-07-02 DIAGNOSIS — R293 Abnormal posture: Secondary | ICD-10-CM | POA: Diagnosis not present

## 2012-07-02 DIAGNOSIS — M25519 Pain in unspecified shoulder: Secondary | ICD-10-CM | POA: Diagnosis not present

## 2012-07-03 DIAGNOSIS — H31009 Unspecified chorioretinal scars, unspecified eye: Secondary | ICD-10-CM | POA: Diagnosis not present

## 2012-07-03 DIAGNOSIS — H04129 Dry eye syndrome of unspecified lacrimal gland: Secondary | ICD-10-CM | POA: Diagnosis not present

## 2012-07-08 ENCOUNTER — Encounter: Payer: Self-pay | Admitting: Internal Medicine

## 2012-07-09 ENCOUNTER — Encounter: Payer: Self-pay | Admitting: Internal Medicine

## 2012-07-09 ENCOUNTER — Ambulatory Visit (INDEPENDENT_AMBULATORY_CARE_PROVIDER_SITE_OTHER): Payer: Medicare Other | Admitting: Internal Medicine

## 2012-07-09 VITALS — BP 102/66 | HR 90 | Temp 98.1°F | Wt 193.0 lb

## 2012-07-09 DIAGNOSIS — R21 Rash and other nonspecific skin eruption: Secondary | ICD-10-CM | POA: Diagnosis not present

## 2012-07-09 DIAGNOSIS — S90569A Insect bite (nonvenomous), unspecified ankle, initial encounter: Secondary | ICD-10-CM

## 2012-07-09 DIAGNOSIS — S80861A Insect bite (nonvenomous), right lower leg, initial encounter: Secondary | ICD-10-CM

## 2012-07-09 DIAGNOSIS — W57XXXA Bitten or stung by nonvenomous insect and other nonvenomous arthropods, initial encounter: Secondary | ICD-10-CM | POA: Diagnosis not present

## 2012-07-09 MED ORDER — DOXYCYCLINE HYCLATE 100 MG PO TABS: 100.0000 mg | ORAL_TABLET | Freq: Two times a day (BID) | ORAL | Status: DC

## 2012-07-09 NOTE — Patient Instructions (Addendum)
Dip gauze in  sterile saline and applied to the wound twice a day. The saline can be purchased at the drugstore or you can make your own .Boil cup of salt in a gallon of water. Store mixture  in a clean container.Report Warning  signs as discussed (red streaks, pus, fever, increasing pain). Doxycycline is an excellent antibiotic for tick related infections. Direct sun exposure should be avoided as much as possible as photosensitivity skin rash  is a potential reaction.

## 2012-07-09 NOTE — Progress Notes (Signed)
  Subjective:    Patient ID: Tom Gaines, male    DOB: 1945-06-24, 67 y.o.   MRN: [id number]  HPI  He removed a tick 06/22/12 from the left medial malleolar area; there was some itching at the site for approximately one week. After the itching resolved he's had a hyperpigmented lesion which has gotten larger and formed a central eschar.     Review of Systems He's had no fever, chills, or sweats. He also denies associated headaches.      Objective:   Physical Exam  He appears healthy and well-nourished in no distress  There is a 1.5 x 1.0 cm hyperpigmented area posterior to the left medial malleolus. There is a central 2 x 2 mm eschar present. There is no blanching with pressure. There appears to be no increase in temperature.  Pes planus is present  Pedal pulses are excellent        Assessment & Plan:  #1 enlarging erythematous lesion in the context of tick bite.

## 2012-08-07 ENCOUNTER — Encounter: Payer: Self-pay | Admitting: Internal Medicine

## 2012-08-07 NOTE — Telephone Encounter (Signed)
Dr. Alwyn Ren Please advise your recommendations for Mr. Griffing. Thanks, GF/RN

## 2012-08-09 NOTE — Telephone Encounter (Signed)
Please advise on pt request for pneumonia vaccine @ [facility].  Appointment Request From: Tom Gaines  With Provider: Marga Melnick, MD [-Primary Care Physician-] Preferred Date Range: From 08/08/2012 To 08/10/2012  Preferred Times: Wednesday Afternoon, Thursday Afternoon, Friday Afternoon  Reason: To address the following health maintenance concerns. Pneumococcal Polysaccharide Vaccine Age 67 And Over Comments: Can I get the shot at the [facility] facility? That is a lot closer to me that the [facility] office. Thank you.

## 2012-09-05 ENCOUNTER — Other Ambulatory Visit: Payer: Self-pay

## 2012-09-08 ENCOUNTER — Encounter: Payer: Self-pay | Admitting: Internal Medicine

## 2012-10-15 DIAGNOSIS — C61 Malignant neoplasm of prostate: Secondary | ICD-10-CM | POA: Diagnosis not present

## 2012-10-18 ENCOUNTER — Ambulatory Visit (INDEPENDENT_AMBULATORY_CARE_PROVIDER_SITE_OTHER): Payer: Medicare Other | Admitting: Internal Medicine

## 2012-10-18 ENCOUNTER — Encounter: Payer: Self-pay | Admitting: Internal Medicine

## 2012-10-18 VITALS — BP 164/93 | HR 106 | Temp 98.8°F | Wt 192.6 lb

## 2012-10-18 DIAGNOSIS — J069 Acute upper respiratory infection, unspecified: Secondary | ICD-10-CM | POA: Diagnosis not present

## 2012-10-18 MED ORDER — SULFAMETHOXAZOLE-TRIMETHOPRIM 800-160 MG PO TABS: ORAL_TABLET | ORAL | Status: DC

## 2012-10-18 NOTE — Progress Notes (Signed)
  Subjective:    Patient ID: Tom Gaines, male    DOB: 11/03/1945, 67 y.o.   MRN: [id number]  HPI   He developed congestion in the frontal and maxillary sinuses 10/17/12 without associated pain or nasal purulence.  The evening of 9/16 he had experienced some sweats  Today he coughed with the production of some green sputum.  He had been employing a Neti pot up to 4 times in the past 24 hours as well as Mucinex DM    Review of Systems  He denies extrinsic symptoms of itchy, watery eyes or sneezing. He's also had no fever or chills.  He denies pelvic pain or discharge. He's noted some "crackling" in his ears with mastication maneuvers.  He also denies sore throat or dental pain     Objective:   Physical Exam General appearance:good health ;well nourished; no acute distress or increased work of breathing is present.  No  lymphadenopathy about the head, neck, or axilla noted.   Eyes: No conjunctival inflammation or lid edema is present.   Ears:  External ear exam shows no significant lesions or deformities.  Otoscopic examination reveals clear canals, tympanic membranes are intact bilaterally without bulging, retraction, inflammation or discharge.  Nose:  External nasal examination shows no deformity or inflammation. Nasal mucosa are pink and moist without lesions or exudates. No septal dislocation or deviation.No obstruction to airflow. Hyponasal speech pattern is present Oral exam: Dental hygiene is good; lips and gums are healthy appearing.There is no oropharyngeal erythema or exudate noted.   Neck:  No deformities, thyromegaly, masses, or tenderness noted.   Supple with full range of motion without pain.   Heart:  Normal rate and regular rhythm. S1 and S2 normal without gallop, murmur, click, rub or other extra sounds.   Lungs:Chest clear to auscultation; no wheezes, rhonchi,rales ,or rubs present.No increased work of breathing.    Extremities:  No cyanosis, edema, or  clubbing  noted    Skin: Warm & dry .         Assessment & Plan:  #1 URI, acute Plan: See orders and recommendations

## 2012-10-18 NOTE — Patient Instructions (Addendum)

## 2012-10-22 DIAGNOSIS — Z23 Encounter for immunization: Secondary | ICD-10-CM | POA: Diagnosis not present

## 2012-10-26 ENCOUNTER — Telehealth: Payer: Self-pay | Admitting: Internal Medicine

## 2012-10-26 NOTE — Telephone Encounter (Signed)
Please update pt's chart. MyChart message from patient:  I got a flu shot at [facility] today (2012-10-22) since I was due one, not coming to the [place] office again until October 27, and I can't get one thorugh you folks at the [facility] office. But I did want to let you know so you can update my records. Thank you.

## 2012-10-26 NOTE — Telephone Encounter (Signed)
Flu information updated.//AB/CMA

## 2012-10-29 DIAGNOSIS — C61 Malignant neoplasm of prostate: Secondary | ICD-10-CM | POA: Diagnosis not present

## 2012-10-29 DIAGNOSIS — N529 Male erectile dysfunction, unspecified: Secondary | ICD-10-CM | POA: Diagnosis not present

## 2012-11-12 ENCOUNTER — Encounter: Payer: Self-pay | Admitting: Internal Medicine

## 2012-11-12 ENCOUNTER — Telehealth: Payer: Self-pay | Admitting: *Deleted

## 2012-11-13 NOTE — Telephone Encounter (Signed)
error 

## 2012-11-16 ENCOUNTER — Encounter: Payer: Medicare Other | Admitting: Internal Medicine

## 2012-11-28 ENCOUNTER — Other Ambulatory Visit: Payer: Self-pay | Admitting: Internal Medicine

## 2012-11-28 NOTE — Telephone Encounter (Signed)
Allopurinol refill sent to pharmacy

## 2012-12-16 ENCOUNTER — Encounter: Payer: Self-pay | Admitting: Internal Medicine

## 2012-12-19 ENCOUNTER — Other Ambulatory Visit: Payer: Self-pay | Admitting: Internal Medicine

## 2012-12-20 ENCOUNTER — Other Ambulatory Visit: Payer: Self-pay | Admitting: *Deleted

## 2012-12-20 MED ORDER — ALLOPURINOL 300 MG PO TABS: 300.0000 mg | ORAL_TABLET | Freq: Every day | ORAL | Status: DC

## 2012-12-20 NOTE — Telephone Encounter (Signed)
Rx sent to the pharmacy by e-script.//AB/CMA 

## 2012-12-26 ENCOUNTER — Other Ambulatory Visit: Payer: Self-pay | Admitting: *Deleted

## 2012-12-26 ENCOUNTER — Encounter: Payer: Self-pay | Admitting: Internal Medicine

## 2013-01-02 ENCOUNTER — Other Ambulatory Visit: Payer: Self-pay | Admitting: *Deleted

## 2013-01-02 ENCOUNTER — Encounter: Payer: Self-pay | Admitting: Internal Medicine

## 2013-01-02 DIAGNOSIS — Z0000 Encounter for general adult medical examination without abnormal findings: Secondary | ICD-10-CM

## 2013-01-02 DIAGNOSIS — M109 Gout, unspecified: Secondary | ICD-10-CM

## 2013-01-02 DIAGNOSIS — E785 Hyperlipidemia, unspecified: Secondary | ICD-10-CM

## 2013-01-02 MED ORDER — ALLOPURINOL 300 MG PO TABS: 300.0000 mg | ORAL_TABLET | Freq: Every day | ORAL | Status: DC

## 2013-01-02 NOTE — Telephone Encounter (Signed)
Rx sent to the pharmacy by e-script.//AB/CMA 

## 2013-01-03 ENCOUNTER — Other Ambulatory Visit: Payer: Self-pay | Admitting: Dermatology

## 2013-01-03 DIAGNOSIS — L723 Sebaceous cyst: Secondary | ICD-10-CM | POA: Diagnosis not present

## 2013-01-03 DIAGNOSIS — D2360 Other benign neoplasm of skin of unspecified upper limb, including shoulder: Secondary | ICD-10-CM | POA: Diagnosis not present

## 2013-01-03 DIAGNOSIS — D485 Neoplasm of uncertain behavior of skin: Secondary | ICD-10-CM | POA: Diagnosis not present

## 2013-01-03 DIAGNOSIS — B009 Herpesviral infection, unspecified: Secondary | ICD-10-CM | POA: Diagnosis not present

## 2013-01-03 DIAGNOSIS — C44519 Basal cell carcinoma of skin of other part of trunk: Secondary | ICD-10-CM | POA: Diagnosis not present

## 2013-01-03 DIAGNOSIS — L570 Actinic keratosis: Secondary | ICD-10-CM | POA: Diagnosis not present

## 2013-01-21 DIAGNOSIS — C61 Malignant neoplasm of prostate: Secondary | ICD-10-CM | POA: Diagnosis not present

## 2013-02-06 ENCOUNTER — Other Ambulatory Visit (INDEPENDENT_AMBULATORY_CARE_PROVIDER_SITE_OTHER): Payer: Medicare Other

## 2013-02-06 ENCOUNTER — Encounter: Payer: Self-pay | Admitting: Internal Medicine

## 2013-02-06 DIAGNOSIS — Z0000 Encounter for general adult medical examination without abnormal findings: Secondary | ICD-10-CM

## 2013-02-06 DIAGNOSIS — M109 Gout, unspecified: Secondary | ICD-10-CM

## 2013-02-06 DIAGNOSIS — E785 Hyperlipidemia, unspecified: Secondary | ICD-10-CM

## 2013-02-06 LAB — HEPATIC FUNCTION PANEL
ALK PHOS: 75 U/L (ref 39–117)
ALT: 51 U/L (ref 0–53)
AST: 51 U/L — AB (ref 0–37)
Albumin: 4.3 g/dL (ref 3.5–5.2)
Bilirubin, Direct: 0.1 mg/dL (ref 0.0–0.3)
Total Bilirubin: 0.6 mg/dL (ref 0.3–1.2)
Total Protein: 6.9 g/dL (ref 6.0–8.3)

## 2013-02-06 LAB — BASIC METABOLIC PANEL
BUN: 22 mg/dL (ref 6–23)
CALCIUM: 9.3 mg/dL (ref 8.4–10.5)
CO2: 30 meq/L (ref 19–32)
CREATININE: 1.2 mg/dL (ref 0.4–1.5)
Chloride: 104 mEq/L (ref 96–112)
GFR: 64.66 mL/min (ref >60.00)
GLUCOSE: 100 mg/dL — AB (ref 70–99)
Potassium: 4 mEq/L (ref 3.5–5.1)
SODIUM: 140 meq/L (ref 135–145)

## 2013-02-06 LAB — CBC WITH DIFFERENTIAL/PLATELET
Basophils Absolute: 0 10*3/uL (ref 0.0–0.1)
Basophils Relative: 0.4 % (ref 0.0–3.0)
EOS ABS: 0.2 10*3/uL (ref 0.0–0.7)
Eosinophils Relative: 2.2 % (ref 0.0–5.0)
HCT: 42.2 % (ref 39.0–52.0)
Hemoglobin: 14.3 g/dL (ref 13.0–17.0)
LYMPHS PCT: 27.9 % (ref 12.0–46.0)
Lymphs Abs: 2.1 10*3/uL (ref 0.7–4.0)
MCHC: 34 g/dL (ref 30.0–36.0)
MCV: 90.5 fl (ref 78.0–100.0)
Monocytes Absolute: 0.5 10*3/uL (ref 0.1–1.0)
Monocytes Relative: 6.2 % (ref 3.0–12.0)
NEUTROS PCT: 63.3 % (ref 43.0–77.0)
Neutro Abs: 4.8 10*3/uL (ref 1.4–7.7)
Platelets: 240 10*3/uL (ref 150.0–400.0)
RBC: 4.66 Mil/uL (ref 4.22–5.81)
RDW: 15.5 % — AB (ref 11.5–14.6)
WBC: 7.7 10*3/uL (ref 4.5–10.5)

## 2013-02-06 LAB — URIC ACID: URIC ACID, SERUM: 4.4 mg/dL (ref 4.0–7.8)

## 2013-02-06 LAB — LIPID PANEL
CHOL/HDL RATIO: 4
Cholesterol: 195 mg/dL (ref 0–200)
HDL: 44.4 mg/dL (ref >39.00)
LDL Cholesterol: 118 mg/dL — ABNORMAL HIGH (ref 0–99)
Triglycerides: 165 mg/dL — ABNORMAL HIGH (ref 0.0–149.0)
VLDL: 33 mg/dL (ref 0.0–40.0)

## 2013-02-06 LAB — TSH: TSH: 0.91 u[IU]/mL (ref 0.35–5.50)

## 2013-02-12 ENCOUNTER — Telehealth: Payer: Self-pay

## 2013-02-12 NOTE — Telephone Encounter (Signed)
Medication List and allergies:  Updated and Reviewed  90 day supply/mail order: [facility]   Immunization due: Pneumonia Vaccine admin at appt.  A/P: No changes to personal, family or [place] Flu-10/22/12 Tdap- 03/12/08 Shingles- 05/30/2006 CCS-02/06/12-Benign polyps PSA-03/01/10-0.60   To discuss with provider: Not at this time.

## 2013-02-13 ENCOUNTER — Encounter: Payer: Self-pay | Admitting: Internal Medicine

## 2013-02-13 ENCOUNTER — Ambulatory Visit: Payer: Medicare Other

## 2013-02-13 ENCOUNTER — Ambulatory Visit (INDEPENDENT_AMBULATORY_CARE_PROVIDER_SITE_OTHER): Payer: Medicare Other | Admitting: Internal Medicine

## 2013-02-13 ENCOUNTER — Ambulatory Visit (INDEPENDENT_AMBULATORY_CARE_PROVIDER_SITE_OTHER)
Admission: RE | Admit: 2013-02-13 | Discharge: 2013-02-13 | Disposition: A | Discharge status: Home / Self Care | Payer: Medicare Other | Source: Ambulatory Visit | Attending: Internal Medicine | Admitting: Internal Medicine

## 2013-02-13 VITALS — BP 107/69 | HR 66 | Temp 98.4°F | Ht 72.25 in | Wt 193.8 lb

## 2013-02-13 DIAGNOSIS — E785 Hyperlipidemia, unspecified: Secondary | ICD-10-CM

## 2013-02-13 DIAGNOSIS — M109 Gout, unspecified: Secondary | ICD-10-CM | POA: Diagnosis not present

## 2013-02-13 DIAGNOSIS — Z23 Encounter for immunization: Secondary | ICD-10-CM

## 2013-02-13 DIAGNOSIS — C4491 Basal cell carcinoma of skin, unspecified: Secondary | ICD-10-CM

## 2013-02-13 DIAGNOSIS — R0781 Pleurodynia: Secondary | ICD-10-CM

## 2013-02-13 DIAGNOSIS — R079 Chest pain, unspecified: Secondary | ICD-10-CM

## 2013-02-13 DIAGNOSIS — Z0000 Encounter for general adult medical examination without abnormal findings: Secondary | ICD-10-CM

## 2013-02-13 DIAGNOSIS — R7309 Other abnormal glucose: Secondary | ICD-10-CM

## 2013-02-13 HISTORY — DX: Basal cell carcinoma of skin, unspecified: C44.91

## 2013-02-13 LAB — HEMOGLOBIN A1C: Hgb A1c MFr Bld: 5.9 % (ref 4.6–6.5)

## 2013-02-13 NOTE — Patient Instructions (Addendum)
Order for x-rays entered into  the computer; these will be performed at [place]. across from [facility]. No appointment is necessary. Plain Mucinex (NOT D) for thick secretions ;force NON dairy fluids .   Nasal cleansing in the shower as discussed with lather of mild shampoo.After 10 seconds wash off lather while  exhaling through nostrils. Make sure that all residual soap is removed to prevent irritation.  Nasacort AQ OTC 1 spray in each nostril twice a day as needed. Use the "crossover" technique into opposite nostril spraying toward opposite ear @ 45 degree angle, not straight up into nostril.  Use a Neti pot daily only  as needed for significant sinus congestion; going from open side to congested side . Plain Allegra (NOT D )  160 daily , Loratidine 10 mg , OR Zyrtec 10 mg @ bedtime  as needed for itchy eyes & sneezing.

## 2013-02-13 NOTE — Progress Notes (Signed)
Subjective:    Patient ID: Tom Gaines, male    DOB: [birth year], 68 y.o.   MRN: [id number]  HPI Medicare Wellness Visit: Psychosocial and medical history were reviewed as required by Medicare (history related to abuse, antisocial behavior , firearm risk). Social history: Caffeine: 16 oz coffee/day , Alcohol: no , Tobacco GYK:ZLDJ 1987 Exercise:see below Personal safety/fall risk:no Limitations of activities of daily living:no Seatbelt/ smoke alarm use:yes Healthcare Power of Attorney/Living Will status: in place Ophthalmologic exam status:UTD Hearing evaluation status: Orientation: Oriented X 3 Memory and recall: good Spelling or math testing: good Depression/anxiety assessment: no Foreign travel [place] Immunization status for influenza/pneumonia/ shingles /tetanus:UTD Transfusion history:no Preventive health care maintenance status: Colonoscopy as per protocol/standard care:UTD Dental care:every 6 mos Chart reviewed and updated. Active issues reviewed and addressed as documented below.    Review of Systems    He has some intermittent localized discomfort in the left lateral chest wall which is positional. There is no history of significant injury in this area. He also had some sciatica but this hasresolved with yoga. A heart healthy diet is followed; exercise encompasses 30-50  minutes 5 times per week as  CVE, yoga & weights without symptoms.  Family history is negative for premature coronary disease. Advanced cholesterol testing reveals  LDL goal is less than 160 ; ideally < 130 . To date no statin.  Low dose ASA taken Specifically denied are  chest pain, palpitations, dyspnea, or claudication.  Significant abdominal symptoms, memory deficit, or myalgias not present.     Objective:   Physical Exam Gen.:  well-nourished in appearance. Alert, appropriate and cooperative throughout exam.  Head: Normocephalic without obvious abnormalities; pattern alopecia    Eyes: No corneal or conjunctival inflammation noted. Pupils equal round reactive to light and accommodation. Extraocular motion intact.  Ears: External  ear exam reveals no significant lesions or deformities. Canals clear .TMs normal. Hearing is grossly normal bilaterally. Nose: External nasal exam reveals no deformity or inflammation. Nasal mucosa are pink and moist. No lesions or exudates noted.   Mouth: Oral mucosa and oropharynx reveal no lesions or exudates. Teeth in good repair. Neck: No deformities, masses, or tenderness noted. Range of motion decreased. Thyroid normal. Lungs: Normal respiratory effort; chest expands symmetrically. Lungs are clear to auscultation without rales, wheezes, or increased work of breathing.  Chest wall: Palpable boss on the inferior rib on left. Heart: Normal rate and rhythm. Normal S1 and S2. No gallop, click, or rub. No murmur. Abdomen: Bowel sounds normal; abdomen soft and nontender. No masses, organomegaly or hernias noted. Genitalia:  as per Dr Rosana Hoes                                  Musculoskeletal/extremities: No deformity or scoliosis noted of  the thoracic or lumbar spine.  No clubbing, cyanosis, edema, or significant extremity  deformity noted. Range of motion normal .Tone & strength normal. Hand joints normal . Fingernail  health good. Able to lie down & sit up w/o help. Negative SLR bilaterally Vascular: Carotid, radial artery, dorsalis pedis and  posterior tibial pulses are full and equal. No bruits present. Neurologic: Alert and oriented x3. Deep tendon reflexes symmetrical and normal.        Skin: Intact without suspicious lesions or rashes. Lymph: No cervical, axillary lymphadenopathy present. Psych: Mood and affect are normal. Normally interactive  Assessment & Plan:  #1 Medicare Wellness Exam; criteria met ; data entered #2 Problem List/Diagnoses  reviewed #3 palpable boss L inferior rib Plan:  Assessments made/ Orders entered

## 2013-02-13 NOTE — Progress Notes (Signed)
Pre visit review using our clinic review tool, if applicable. No additional management support is needed unless otherwise documented below in the visit note. 

## 2013-02-25 ENCOUNTER — Encounter: Payer: Self-pay | Admitting: Internal Medicine

## 2013-03-04 ENCOUNTER — Ambulatory Visit (INDEPENDENT_AMBULATORY_CARE_PROVIDER_SITE_OTHER): Payer: Medicare Other | Admitting: Internal Medicine

## 2013-03-04 ENCOUNTER — Encounter: Payer: Self-pay | Admitting: Internal Medicine

## 2013-03-04 VITALS — BP 100/68 | HR 96 | Temp 96.6°F | Wt 194.0 lb

## 2013-03-04 DIAGNOSIS — K130 Diseases of lips: Secondary | ICD-10-CM | POA: Diagnosis not present

## 2013-03-04 DIAGNOSIS — R079 Chest pain, unspecified: Secondary | ICD-10-CM

## 2013-03-04 DIAGNOSIS — R0781 Pleurodynia: Secondary | ICD-10-CM

## 2013-03-04 NOTE — Progress Notes (Signed)
Pre visit review using our clinic review tool, if applicable. No additional management support is needed unless otherwise documented below in the visit note. 

## 2013-03-04 NOTE — Progress Notes (Signed)
   Subjective:    Patient ID: Tom Gaines, male    DOB: 04-17-1945, 68 y.o.   MRN: [id number]  HPI   He continues to have intermittent pain in the left lateral inferior thoracic area. It now occurs with certain positioning or activity. It is not associated with deep breathing or coughing.  The chest x-ray 02/13/13 was reviewed. There does appear to be an irregularity of the ninth left rib distally.  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 This first appeared at least 4 weeks ago. Despite suppressive therapy with Valtrex the lesion has not resolved.    Review of Systems  He denies fever, chills, sweats, weight loss.     Objective:   Physical Exam  He appears healthy and well-nourished  He has no lymphadenopathy about the neck or axilla.  There is a small papular lesion on the lateral lower lip on the left. There is no vesicle formation. It's nontender to palpation  There is no pain with compression of the chest wall anteriorly to posteriorly or contralaterally. He cannot isolate the exact location of the pain at this time.        Assessment & Plan:  #1 rib pain ,R/O 9th L fracture #2 neoplasm lip uncertain etiology See orders

## 2013-03-04 NOTE — Patient Instructions (Signed)
I recommend an  ENT consultation to determine optimal therapy. 

## 2013-03-06 DIAGNOSIS — K130 Diseases of lips: Secondary | ICD-10-CM | POA: Diagnosis not present

## 2013-03-11 ENCOUNTER — Encounter: Payer: Self-pay | Admitting: Internal Medicine

## 2013-03-12 NOTE — Telephone Encounter (Signed)
Rx was refilled on (01-02-13) to [facility].//AB/CMA

## 2013-04-22 DIAGNOSIS — C61 Malignant neoplasm of prostate: Secondary | ICD-10-CM | POA: Diagnosis not present

## 2013-05-07 DIAGNOSIS — K130 Diseases of lips: Secondary | ICD-10-CM | POA: Diagnosis not present

## 2013-05-27 DIAGNOSIS — N529 Male erectile dysfunction, unspecified: Secondary | ICD-10-CM | POA: Diagnosis not present

## 2013-05-27 DIAGNOSIS — C61 Malignant neoplasm of prostate: Secondary | ICD-10-CM | POA: Diagnosis not present

## 2013-06-08 ENCOUNTER — Other Ambulatory Visit: Payer: Self-pay | Admitting: Internal Medicine

## 2013-07-03 DIAGNOSIS — H25039 Anterior subcapsular polar age-related cataract, unspecified eye: Secondary | ICD-10-CM | POA: Diagnosis not present

## 2013-07-04 DIAGNOSIS — H31099 Other chorioretinal scars, unspecified eye: Secondary | ICD-10-CM | POA: Diagnosis not present

## 2013-07-04 DIAGNOSIS — H04129 Dry eye syndrome of unspecified lacrimal gland: Secondary | ICD-10-CM | POA: Diagnosis not present

## 2013-07-04 DIAGNOSIS — H11139 Conjunctival pigmentations, unspecified eye: Secondary | ICD-10-CM | POA: Diagnosis not present

## 2013-07-04 DIAGNOSIS — H43819 Vitreous degeneration, unspecified eye: Secondary | ICD-10-CM | POA: Diagnosis not present

## 2013-08-09 ENCOUNTER — Ambulatory Visit (INDEPENDENT_AMBULATORY_CARE_PROVIDER_SITE_OTHER): Payer: Medicare Other | Admitting: Internal Medicine

## 2013-08-09 ENCOUNTER — Encounter: Payer: Self-pay | Admitting: Internal Medicine

## 2013-08-09 VITALS — BP 120/78 | HR 80 | Temp 99.1°F | Resp 16 | Wt 190.0 lb

## 2013-08-09 DIAGNOSIS — J029 Acute pharyngitis, unspecified: Secondary | ICD-10-CM

## 2013-08-09 MED ORDER — AZITHROMYCIN 250 MG PO TABS: ORAL_TABLET | ORAL | Status: DC

## 2013-08-09 MED ORDER — PROMETHAZINE-CODEINE 6.25-10 MG/5ML PO SYRP: 5.0000 mL | ORAL_SOLUTION | ORAL | Status: DC | PRN

## 2013-08-09 NOTE — Progress Notes (Signed)
Pre visit review using our clinic review tool, if applicable. No additional management support is needed unless otherwise documented below in the visit note. 

## 2013-08-09 NOTE — Progress Notes (Signed)
   Subjective:    Patient ID: Tom Gaines, male    DOB: [birth year], 68 y.o.   MRN: [id number]  Sore Throat  This is a new problem. The current episode started in the past 7 days (3 d). The problem has been unchanged. The maximum temperature recorded prior to his arrival was 100 - 100.9 F. The pain is at a severity of 9/10. The pain is severe. Associated symptoms include trouble swallowing. Pertinent negatives include no abdominal pain or stridor.      Review of Systems  HENT: Positive for trouble swallowing.   Respiratory: Negative for stridor.   Gastrointestinal: Negative for abdominal pain.       Objective:   Physical Exam  Constitutional: He is oriented to person, place, and time. He appears well-developed and well-nourished. No distress.  NAD  HENT:  Mouth/Throat: Oropharynx is clear and moist. No oropharyngeal exudate.  Eyes: Conjunctivae are normal. Pupils are equal, round, and reactive to light. Right eye exhibits no discharge.  Neck: Normal range of motion. No JVD present. No tracheal deviation present. No thyromegaly present.  Cardiovascular: Normal rate, regular rhythm, normal heart sounds and intact distal pulses.  Exam reveals no gallop and no friction rub.   No murmur heard. Pulmonary/Chest: Effort normal and breath sounds normal. No stridor. No respiratory distress. He has no wheezes. He has no rales. He exhibits no tenderness.  Abdominal: Soft. Bowel sounds are normal. He exhibits no distension and no mass. There is no tenderness. There is no rebound and no guarding.  Musculoskeletal: Normal range of motion. He exhibits no edema and no tenderness.  Lymphadenopathy:    He has cervical adenopathy (mild).  Neurological: He is alert and oriented to person, place, and time. He has normal reflexes. No cranial nerve deficit. He exhibits normal muscle tone. He displays a negative Romberg sign. Coordination and gait normal.  No meningeal signs  Skin: Skin is warm and dry. No  rash noted.  Psychiatric: He has a normal mood and affect. His behavior is normal. Judgment and thought content normal.   eryth throat  Strep test (-)       Assessment & Plan:

## 2013-08-09 NOTE — Assessment & Plan Note (Signed)
Strep test 

## 2013-08-26 DIAGNOSIS — C61 Malignant neoplasm of prostate: Secondary | ICD-10-CM | POA: Diagnosis not present

## 2013-09-16 ENCOUNTER — Encounter: Payer: Self-pay | Admitting: Internal Medicine

## 2013-10-31 DIAGNOSIS — Z23 Encounter for immunization: Secondary | ICD-10-CM | POA: Diagnosis not present

## 2013-11-13 ENCOUNTER — Encounter: Payer: Self-pay | Admitting: Internal Medicine

## 2013-11-14 ENCOUNTER — Other Ambulatory Visit: Payer: Self-pay | Admitting: Internal Medicine

## 2013-11-15 ENCOUNTER — Other Ambulatory Visit (INDEPENDENT_AMBULATORY_CARE_PROVIDER_SITE_OTHER): Payer: Medicare Other

## 2013-11-15 ENCOUNTER — Ambulatory Visit (INDEPENDENT_AMBULATORY_CARE_PROVIDER_SITE_OTHER): Payer: Medicare Other | Admitting: Internal Medicine

## 2013-11-15 ENCOUNTER — Encounter: Payer: Self-pay | Admitting: Internal Medicine

## 2013-11-15 VITALS — BP 128/88 | HR 69 | Temp 98.4°F | Resp 12 | Wt 196.2 lb

## 2013-11-15 DIAGNOSIS — H5702 Anisocoria: Secondary | ICD-10-CM

## 2013-11-15 DIAGNOSIS — R413 Other amnesia: Secondary | ICD-10-CM | POA: Diagnosis not present

## 2013-11-15 LAB — VITAMIN B12: Vitamin B-12: 418 pg/mL (ref 211–911)

## 2013-11-15 LAB — TSH: TSH: 0.92 u[IU]/mL (ref 0.35–4.50)

## 2013-11-15 NOTE — Patient Instructions (Signed)
Your next office appointment will be determined based upon review of your pending labs . Those instructions will be transmitted to you through My Chart .  Followup as needed for your acute issue. Please report any significant change in your symptoms. 

## 2013-11-15 NOTE — Progress Notes (Signed)
   Subjective:    Patient ID: Tom Gaines, male    DOB: [birth year], 68 y.o.   MRN: [id number]  HPI    He is here as his wife questions possible memory deficit . His daughter questions whether he's been forgetful at times.  He questions possible increase in risk for Alzheimer's. His mother is 62 & does have Alzheimer's   Review of Systems   He has decreased range of motion of the cervical spine and has been doing exercises to improve this.  He notes he can sleep more than 8 hours & wake rested. He has not been told of excess snoring or apnea.     Objective:   Physical Exam   Cranial nerve exam is unremarkable except for anisocoria. The right pupil is larger than the left. Strength, tone, and deep tendon reflexes are normal. There is decreased range of motion with lateral rotation of the neck.There is no neuromuscular deficit noted of the hands.   Mini-Mental Status exam was completed. He scored 30 out of 30. Clock test was excellent as was naming  animals. He named 10 animals in 15 seconds.        Assessment & Plan:  He comes in as his wife is concerned that he has memory issues. His daughter feels he is forgetful  #1  #1 question memory deficit; none documented  #2 decreased cervical range of motion with no cervical nerve deficit on exam  #3 anisocoria.  Plan: Ophthalmology evaluation of anisocoria Consider Vayacog as option  See labs

## 2013-11-15 NOTE — Progress Notes (Signed)
Pre visit review using our clinic review tool, if applicable. No additional management support is needed unless otherwise documented below in the visit note. 

## 2013-11-16 LAB — RPR

## 2013-11-18 DIAGNOSIS — C61 Malignant neoplasm of prostate: Secondary | ICD-10-CM | POA: Diagnosis not present

## 2013-11-24 ENCOUNTER — Encounter: Payer: Self-pay | Admitting: Internal Medicine

## 2013-11-25 ENCOUNTER — Other Ambulatory Visit: Payer: Self-pay

## 2013-11-25 ENCOUNTER — Other Ambulatory Visit: Payer: Self-pay | Admitting: Internal Medicine

## 2013-11-25 DIAGNOSIS — C61 Malignant neoplasm of prostate: Secondary | ICD-10-CM | POA: Diagnosis not present

## 2013-11-25 DIAGNOSIS — N528 Other male erectile dysfunction: Secondary | ICD-10-CM | POA: Diagnosis not present

## 2013-11-25 MED ORDER — PHOSPHATIDYLSERINE-DHA-EPA 100-19.5-6.5 MG PO CAPS: 1.0000 | ORAL_CAPSULE | Freq: Every day | ORAL | Status: DC

## 2013-12-03 ENCOUNTER — Encounter: Payer: Self-pay | Admitting: Internal Medicine

## 2013-12-04 MED ORDER — PHOSPHATIDYLSERINE-DHA-EPA 75-21.5-8.5 MG PO CAPS: 1.0000 | ORAL_CAPSULE | Freq: Two times a day (BID) | ORAL | Status: DC

## 2014-01-02 ENCOUNTER — Other Ambulatory Visit: Payer: Self-pay | Admitting: Dermatology

## 2014-01-02 DIAGNOSIS — D485 Neoplasm of uncertain behavior of skin: Secondary | ICD-10-CM | POA: Diagnosis not present

## 2014-01-02 DIAGNOSIS — L821 Other seborrheic keratosis: Secondary | ICD-10-CM | POA: Diagnosis not present

## 2014-01-02 DIAGNOSIS — D235 Other benign neoplasm of skin of trunk: Secondary | ICD-10-CM | POA: Diagnosis not present

## 2014-01-02 DIAGNOSIS — L570 Actinic keratosis: Secondary | ICD-10-CM | POA: Diagnosis not present

## 2014-01-02 DIAGNOSIS — C44519 Basal cell carcinoma of skin of other part of trunk: Secondary | ICD-10-CM | POA: Diagnosis not present

## 2014-01-02 DIAGNOSIS — D2339 Other benign neoplasm of skin of other parts of face: Secondary | ICD-10-CM | POA: Diagnosis not present

## 2014-02-13 ENCOUNTER — Telehealth: Payer: Self-pay

## 2014-02-13 ENCOUNTER — Other Ambulatory Visit (INDEPENDENT_AMBULATORY_CARE_PROVIDER_SITE_OTHER): Payer: Medicare Other

## 2014-02-13 DIAGNOSIS — Z86010 Personal history of colonic polyps: Secondary | ICD-10-CM | POA: Diagnosis not present

## 2014-02-13 DIAGNOSIS — E785 Hyperlipidemia, unspecified: Secondary | ICD-10-CM

## 2014-02-13 DIAGNOSIS — Z8639 Personal history of other endocrine, nutritional and metabolic disease: Secondary | ICD-10-CM | POA: Diagnosis not present

## 2014-02-13 DIAGNOSIS — Z8739 Personal history of other diseases of the musculoskeletal system and connective tissue: Secondary | ICD-10-CM

## 2014-02-13 LAB — CBC WITH DIFFERENTIAL/PLATELET
Basophils Absolute: 0 10*3/uL (ref 0.0–0.1)
Basophils Relative: 0.5 % (ref 0.0–3.0)
EOS ABS: 0.2 10*3/uL (ref 0.0–0.7)
EOS PCT: 2 % (ref 0.0–5.0)
HEMATOCRIT: 44.4 % (ref 39.0–52.0)
Hemoglobin: 14.4 g/dL (ref 13.0–17.0)
LYMPHS PCT: 30.7 % (ref 12.0–46.0)
Lymphs Abs: 2.5 10*3/uL (ref 0.7–4.0)
MCHC: 32.3 g/dL (ref 30.0–36.0)
MCV: 92.4 fl (ref 78.0–100.0)
MONOS PCT: 9.4 % (ref 3.0–12.0)
Monocytes Absolute: 0.8 10*3/uL (ref 0.1–1.0)
NEUTROS ABS: 4.6 10*3/uL (ref 1.4–7.7)
Neutrophils Relative %: 57.4 % (ref 43.0–77.0)
Platelets: 244 10*3/uL (ref 150.0–400.0)
RBC: 4.8 Mil/uL (ref 4.22–5.81)
RDW: 15.9 % — ABNORMAL HIGH (ref 11.5–15.5)
WBC: 8 10*3/uL (ref 4.0–10.5)

## 2014-02-13 NOTE — Telephone Encounter (Signed)
Phone call from [place] at [facility]. She states patient showed up requesting labs before his physical next week. She already drew the labs even with no orders.  I explained to the patient who Blanch Media placed on the phone that he has to see the physician first before labs are drawn but since she already drew his blood what orders do you want placed.

## 2014-02-13 NOTE — Telephone Encounter (Signed)
CBC & dif, lipids,hepatic panel,TSH,BMET,uric acid Codes Z86.010 M10.9 E78.5

## 2014-02-14 LAB — BASIC METABOLIC PANEL
BUN: 25 mg/dL — ABNORMAL HIGH (ref 6–23)
CO2: 26 meq/L (ref 19–32)
Calcium: 9.9 mg/dL (ref 8.4–10.5)
Chloride: 105 mEq/L (ref 96–112)
Creatinine, Ser: 1.17 mg/dL (ref 0.40–1.50)
GFR: 65.73 mL/min (ref >60.00)
Glucose, Bld: 82 mg/dL (ref 70–99)
POTASSIUM: 4.2 meq/L (ref 3.5–5.1)
SODIUM: 142 meq/L (ref 135–145)

## 2014-02-14 LAB — HEPATIC FUNCTION PANEL
ALBUMIN: 4.3 g/dL (ref 3.5–5.2)
ALK PHOS: 77 U/L (ref 39–117)
ALT: 26 U/L (ref 0–53)
AST: 31 U/L (ref 0–37)
BILIRUBIN DIRECT: 0 mg/dL (ref 0.0–0.3)
BILIRUBIN TOTAL: 0.3 mg/dL (ref 0.2–1.2)
TOTAL PROTEIN: 7.4 g/dL (ref 6.0–8.3)

## 2014-02-14 LAB — LIPID PANEL
CHOL/HDL RATIO: 4
CHOLESTEROL: 202 mg/dL — AB (ref 0–200)
HDL: 48.2 mg/dL (ref >39.00)
NonHDL: 153.8
Triglycerides: 301 mg/dL — ABNORMAL HIGH (ref 0.0–149.0)
VLDL: 60.2 mg/dL — ABNORMAL HIGH (ref 0.0–40.0)

## 2014-02-14 LAB — URIC ACID: Uric Acid, Serum: 4.6 mg/dL (ref 4.0–7.8)

## 2014-02-14 LAB — TSH: TSH: 0.97 u[IU]/mL (ref 0.35–4.50)

## 2014-02-14 LAB — LDL CHOLESTEROL, DIRECT: Direct LDL: 121 mg/dL

## 2014-02-17 ENCOUNTER — Encounter: Payer: Self-pay | Admitting: Internal Medicine

## 2014-02-17 ENCOUNTER — Ambulatory Visit (INDEPENDENT_AMBULATORY_CARE_PROVIDER_SITE_OTHER): Payer: Medicare Other | Admitting: Internal Medicine

## 2014-02-17 VITALS — BP 120/80 | HR 97 | Temp 98.4°F | Resp 15 | Ht 72.0 in | Wt 192.0 lb

## 2014-02-17 DIAGNOSIS — Z86010 Personal history of colonic polyps: Secondary | ICD-10-CM

## 2014-02-17 DIAGNOSIS — Z0000 Encounter for general adult medical examination without abnormal findings: Secondary | ICD-10-CM | POA: Diagnosis not present

## 2014-02-17 DIAGNOSIS — E785 Hyperlipidemia, unspecified: Secondary | ICD-10-CM

## 2014-02-17 DIAGNOSIS — Z8739 Personal history of other diseases of the musculoskeletal system and connective tissue: Secondary | ICD-10-CM

## 2014-02-17 NOTE — Patient Instructions (Addendum)
The most common cause of elevated triglycerides (TG) is the ingestion of sugar from high fructose corn syrup sources added to processed foods & drinks.  Eat a low-fat diet with lots of fruits and vegetables, up to 7-9 servings per day. Consume less than 40 Grams (preferably ZERO) of sugar per day from foods & drinks with High Fructose Corn Syrup (HFCS) sugar as #1,2,3 or # 4 on label.[employer], [employer] [place] do not carry products with HFCS.  To prevent gout the minimal uric acid goal is < 7; preferred is < 6, ideally < 5 to prevent gout.  The most common cause of elevated uric acid is also the ingestion of sugar from high fructose corn syrup sources.   Repeat fasting  labs in late May.

## 2014-02-17 NOTE — Progress Notes (Signed)
Subjective:    Patient ID: Tom Gaines, male    DOB: 1945-05-12, 69 y.o.   MRN: [id number]  HPI  Medicare Wellness Visit: Psychosocial and medical history were reviewed as required by Medicare (history related to abuse, antisocial behavior , firearm risk). Social history: Caffeine: 2 cups coffee/day Alcohol:  none Tobacco SMO:LMBE 1987 Exercise: 5 days/ week Personal safety/fall risk:no Limitations of activities of daily living:no Seatbelt/ smoke alarm use:yes Healthcare Power of Attorney/Living Will status and End of Life process assessment : UTD Ophthalmologic exam status:UTD Hearing evaluation status:not UTD Orientation: Oriented X 3 Memory and recall: good Spelling  testing: good Depression/anxiety assessment: no Foreign travel history: 2014 [place] Immunization status for influenza/pneumonia/ shingles /tetanus: /UTD Transfusion history:no Preventive health care maintenance status: Colonoscopy as per protocol/standard care: Dental care:every 6 mos Chart reviewed and updated. Active issues reviewed and addressed as documented below.  As noted above; exercise level is excellent. He has no cardiopulmonary symptoms with that. He is on a heart healthy diet  Advanced cholesterol testing reveals that his LDL goal is less than 160, ideally less than 130. Has no family history of premature heart attack or stroke  His last gout attack was in October 2014; he continues prophylactic allopurinol.  His colonoscopy is up-to-date; in 2014 he had 2 small sessile polyps. He has no GI symptoms.    Review of Systems   Chest pain, palpitations, tachycardia, exertional dyspnea, paroxysmal nocturnal dyspnea, claudication or edema are absent.  Unexplained weight loss, abdominal pain, significant dyspepsia, dysphagia, melena, rectal bleeding, or persistently small caliber stools are denied.     Objective:   Physical Exam  Gen.: Healthy and well-nourished in appearance. Alert,  appropriate and cooperative throughout exam. Appears younger than stated age  Head: Normocephalic without obvious abnormalities;  pattern alopecia  Eyes: No corneal or conjunctival inflammation noted. Pupils equal round reactive to light and accommodation. Extraocular motion intact.  Ears: External  ear exam reveals no significant lesions or deformities. Canals clear .TMs normal. Hearing is grossly normal bilaterally. Nose: External nasal exam reveals no deformity or inflammation. Nasal mucosa are pink and moist. No lesions or exudates noted.   Mouth: Oral mucosa and oropharynx reveal no lesions or exudates. Teeth in good repair. Neck: No deformities, masses, or tenderness noted. Range of motion & Thyroid normal.. Lungs: Normal respiratory effort; chest expands symmetrically. Lungs are clear to auscultation without rales, wheezes, or increased work of breathing. Heart: Normal rate and rhythm. Normal S1 and S2. No gallop, click, or rub. No murmur. Abdomen: Bowel sounds normal; abdomen soft and nontender. No masses, organomegaly or hernias noted. Genitalia: as per Dr Rosana Hoes   Musculoskeletal/extremities: No deformity or scoliosis noted of  the thoracic or lumbar spine.  No clubbing, cyanosis, edema, or significant extremity  deformity noted.  Range of motion normal . Tone & strength normal. Hand joints normal  Fingernail health good. Crepitus of knees  Able to lie down & sit up w/o help.  Negative SLR bilaterally Vascular: Carotid, radial artery, dorsalis pedis and  posterior tibial pulses are full and equal. No bruits present. Neurologic: Alert and oriented x3. Deep tendon reflexes symmetrical and normal.  Gait normal    Skin: Intact without suspicious lesions or rashes. Lymph: No cervical, axillary lymphadenopathy present. Psych: Mood and affect are normal. Normally interactive  Assessment & Plan:  See  Current Assessment & Plan in Problem List under specific DiagnosisThe labs will be reviewed and risks and options assessed. Written recommendations will be provided by mail or directly through My Chart.Further evaluation or change in medical therapy will be directed by those results.

## 2014-02-17 NOTE — Progress Notes (Signed)
Pre visit review using our clinic review tool, if applicable. No additional management support is needed unless otherwise documented below in the visit note. 

## 2014-02-17 NOTE — Assessment & Plan Note (Signed)
The most common cause of elevated triglycerides (TG) is the ingestion of sugar from high fructose corn syrup sources added to processed foods & drinks.  Eat a low-fat diet with lots of fruits and vegetables, up to 7-9 servings per day. Consume less than 40 Grams (preferably ZERO) of sugar per day from foods & drinks with High Fructose Corn Syrup (HFCS) sugar as #1,2,3 or # 4 on label.[employer], [employer] [place] do not carry products with HFCS. Repeat fasting lipids in 4 months

## 2014-02-18 NOTE — Assessment & Plan Note (Signed)
CBC WNL Colonoscopy 2019

## 2014-02-21 ENCOUNTER — Other Ambulatory Visit: Payer: Self-pay | Admitting: Internal Medicine

## 2014-02-24 DIAGNOSIS — C61 Malignant neoplasm of prostate: Secondary | ICD-10-CM | POA: Diagnosis not present

## 2014-03-10 ENCOUNTER — Ambulatory Visit (INDEPENDENT_AMBULATORY_CARE_PROVIDER_SITE_OTHER): Payer: Medicare Other | Admitting: Internal Medicine

## 2014-03-10 ENCOUNTER — Encounter: Payer: Self-pay | Admitting: Internal Medicine

## 2014-03-10 ENCOUNTER — Other Ambulatory Visit (INDEPENDENT_AMBULATORY_CARE_PROVIDER_SITE_OTHER): Payer: Medicare Other

## 2014-03-10 VITALS — BP 130/76 | HR 79 | Temp 98.2°F | Resp 16 | Ht 72.0 in | Wt 192.4 lb

## 2014-03-10 DIAGNOSIS — M545 Low back pain, unspecified: Secondary | ICD-10-CM

## 2014-03-10 LAB — URINALYSIS
Bilirubin Urine: NEGATIVE
Hgb urine dipstick: NEGATIVE
KETONES UR: NEGATIVE
LEUKOCYTES UA: NEGATIVE
NITRITE: NEGATIVE
SPECIFIC GRAVITY, URINE: 1.01 (ref 1.000–1.030)
Total Protein, Urine: NEGATIVE
URINE GLUCOSE: NEGATIVE
Urobilinogen, UA: 0.2 (ref 0.0–1.0)
pH: 6 (ref 5.0–8.0)

## 2014-03-10 MED ORDER — TRAMADOL HCL 50 MG PO TABS: 50.0000 mg | ORAL_TABLET | Freq: Four times a day (QID) | ORAL | Status: DC | PRN

## 2014-03-10 MED ORDER — HYDROCODONE-ACETAMINOPHEN 10-325 MG PO TABS: 1.0000 | ORAL_TABLET | Freq: Three times a day (TID) | ORAL | Status: DC | PRN

## 2014-03-10 MED ORDER — CYCLOBENZAPRINE HCL 5 MG PO TABS: ORAL_TABLET | ORAL | Status: DC

## 2014-03-10 NOTE — Progress Notes (Signed)
Pre visit review using our clinic review tool, if applicable. No additional management support is needed unless otherwise documented below in the visit note. 

## 2014-03-10 NOTE — Patient Instructions (Signed)
Use an anti-inflammatory cream such as Aspercreme or Zostrix cream twice a day to the affected area as needed. In lieu of this warm moist compresses or  hot water bottle can be used. Do not apply ice .  The best exercises for the low back include freestyle swimming, stretch aerobics, and yoga. 

## 2014-03-10 NOTE — Progress Notes (Signed)
   Subjective:    Patient ID: Tom Gaines, male    DOB: 06-05-1945, 69 y.o.   MRN: [id number]  HPI  Symptoms began 03/08/14 sometime after he had arisen from bed. He describes left lumbosacral area pain which has become constant and dull up to a level VIII. It is worse with standing. Heat helps some. Aleve and outdated oxycodone did not.  He has has some intermittent lumbosacral area pain since his disc surgery in 2002.  He previously had some sciatic symptoms in the right lower extremity which resolved after 3-4 months of yoga.  PMH of prostate cancer; S/P radical prostatectomy 1998.S/P lumbar disc surgery 2002. Sessile colon polyps resected in 2014.  Review of Systems Fever, chills, sweats, or unexplained weight loss not present. Vertigo, near syncope or imbalance denied. No dysuria, pyuria, or hematuria present. There has has no change in bowel habits; melena or rectal bleeding denied. No associated rash or change in color or temperature of skin in the area the symptoms.  No loss of control of bladder or bowels. Radicular type pain absent. There is no numbness, tingling, or weakness in extremities.       Objective:   Physical Exam  Gen.: Adequately nourished in appearance. Alert, appropriate and cooperative throughout exam.  Appears younger than stated age  Head: Normocephalic without obvious abnormalities  Eyes: No corneal or conjunctival inflammation noted. Pupils equal round reactive to light and accommodation. Extraocular motion intact.   Neck: No deformities, masses, or tenderness noted. Range of motion decreased  Lungs: Normal respiratory effort; chest expands symmetrically. Lungs are clear to auscultation without rales, wheezes, or increased work of breathing. Heart: Normal rate and rhythm. Normal S1 and S2. No gallop, click, or rub. No murmur. Abdomen: Bowel sounds normal; abdomen soft and nontender. No masses, organomegaly or hernias noted.                             Musculoskeletal/extremities: No deformity or scoliosis noted of  the thoracic or lumbar spine.  No clubbing, cyanosis, edema, or significant extremity  deformity noted.  Range of motion normal . Tone & strength normal. Hand joints normal  Fingernail  health good. Able to lie down & sit up w/o help.  Negative SLR bilaterally Vascular: Carotid, radial artery, dorsalis pedis and  posterior tibial pulses are full and equal. No bruits present. Neurologic: Alert and oriented x3. Deep tendon reflexes symmetrical and normal.  Gait normal   Heel & toe walking normal   Skin: Intact without suspicious lesions or rashes. Lymph: No cervical, axillary lymphadenopathy present. Psych: Mood and affect are normal. Normally interactive                                                                                      Assessment & Plan:  #1 acute low back syndrome, musculoskeletal. No neurologic deficit or evidence of acute disc.  Plan: See orders and recommendations. Imaging if symptoms persist

## 2014-03-13 ENCOUNTER — Encounter: Payer: Self-pay | Admitting: Family

## 2014-03-13 ENCOUNTER — Ambulatory Visit (INDEPENDENT_AMBULATORY_CARE_PROVIDER_SITE_OTHER): Payer: Medicare Other | Admitting: Family

## 2014-03-13 VITALS — BP 130/84 | HR 96 | Temp 98.4°F | Resp 18 | Ht 72.0 in | Wt 190.1 lb

## 2014-03-13 DIAGNOSIS — M545 Low back pain, unspecified: Secondary | ICD-10-CM

## 2014-03-13 HISTORY — DX: Low back pain, unspecified: M54.50

## 2014-03-13 MED ORDER — PREDNISONE 10 MG PO TABS: ORAL_TABLET | ORAL | Status: DC

## 2014-03-13 MED ORDER — DIAZEPAM 5 MG PO TABS: 5.0000 mg | ORAL_TABLET | Freq: Three times a day (TID) | ORAL | Status: DC | PRN

## 2014-03-13 NOTE — Assessment & Plan Note (Signed)
Symptoms and exam consistent with potential low back strain or possibly sacroiliac dysfunction. Start prednisone taper. Discontinue cyclobenzaprine. Start Valium as needed for muscle spasms and sleep. Continue heat and stretching. Follow-up in 2-3 days to determine effectiveness of prednisone. If no improvement consider imaging and possible referral to physical therapy.

## 2014-03-13 NOTE — Progress Notes (Addendum)
Subjective:    Patient ID: Tom Gaines, male    DOB: [birth year], 69 y.o.   MRN: [id number]  Chief Complaint  Patient presents with  . Back Pain    having lower back pain that was seen monday for by Dr. Linna Darner. Says the pain med and muscle relaxant that was given has not helped much at all    HPI:  Tom Gaines is a 69 y.o. male who presents today for a follow up on back pain.  Was recently seen in the office and diagnosed with  acute low back syndrome and treated with cyclobenaprine and norco. Notes no relief with the medication that was prescribed. He has used heat which does provide some relief.   Continues to experience the associated symptom of achy/sharp pain located around his L3 area that has been going on for about 5 days. Denies any trauma. Notes he may have had some radiculopathy on the left side. Pain intensity around a 7-8 /10. Has had previous microscopic discectomy. Denies any change to bowel or bladder habits.    Allergies  Allergen Reactions  . 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 acyclovir (ZOVIRAX) 800 MG tablet Take 400 mg by mouth 2 (two) times daily.     Marland Kitchen allopurinol (ZYLOPRIM) 300 MG tablet TAKE 1 TABLET DAILY 90 tablet 1  . ALREX 0.2 % SUSP 1 drop as needed.     . Ascorbic Acid (VITAMIN C) 500 MG tablet Take 500 mg by mouth daily.      Marland Kitchen aspirin 81 MG tablet Take 81 mg by mouth daily.      . cetirizine (ZYRTEC) 10 MG tablet Take 10 mg by mouth as needed.     . cyclobenzaprine (FLEXERIL) 5 MG tablet 1-2 qhs prn 14 tablet 0  . fluticasone (FLONASE) 50 MCG/ACT nasal spray USE 1 SPRAY NASALLY TWICE A DAY AS NEEDED 16 g 5  . HYDROcodone-acetaminophen (NORCO) 10-325 MG per tablet Take 1 tablet by mouth every 8 (eight) hours as needed. 30 tablet 0  . Multiple Vitamin (MULTIVITAMIN) tablet Take 1 tablet by  mouth daily.      . naproxen sodium (ALEVE) 220 MG tablet Take 220 mg by mouth as needed.      . Omega-3 Fatty Acids (FISH OIL) 1000 MG CAPS Take 1,000 mg by mouth. 1 by mouth daily    . Phosphatidylserine-DHA-EPA (VAYACOG) 100-19.5-6.5 MG CAPS Take 1 capsule by mouth daily. 90 capsule 1   No current facility-administered medications on file prior to visit.    Past Medical History  Diagnosis Date  . Gout   . Prostate cancer 1998    Past Surgical History  Procedure Laterality Date  . Prostatectomy  1998  . Lumbar disc surgery  2002    Dr Ellene Route  . Tonsillectomy and adenoidectomy    . Shoulder arthroscopy      right; 2011; left 1990  . Colonoscopy with polypectomy  2014    Dr Fuller Plan    Review of Systems  Musculoskeletal: Positive for back pain.  Neurological: Negative for numbness.      Objective:    BP 130/84 mmHg  Pulse 96  Temp(Src) 98.4 F (36.9 C) (Oral)  Resp 18  Ht 6' (1.829 m)  Wt 190 lb 1.9 oz (86.238 kg)  BMI 25.78  kg/m2  SpO2 94% Nursing note and vital signs reviewed.  Physical Exam  Constitutional: He is oriented to person, place, and time. He appears well-developed and well-nourished. No distress.  Cardiovascular: Normal rate, regular rhythm, normal heart sounds and intact distal pulses.   Pulmonary/Chest: Effort normal and breath sounds normal.  Musculoskeletal:  No obvious deformity, discoloration, or edema noted of lumbar spine. Palpable tenderness along the midline L4 and paraspinal musculature. No palpable muscle spasm noted. Mild tenderness elicited over sacroiliac joints. Patient displays limited flexion secondary to hamstring tightness and full range of motion in other directions. Distal pulses and sensation are intact and appropriate.  Neurological: He is alert and oriented to person, place, and time.  Skin: Skin is warm and dry.  Psychiatric: He has a normal mood and affect. His behavior is normal. Judgment and thought content normal.         Assessment & Plan:

## 2014-03-13 NOTE — Patient Instructions (Signed)
Thank you for choosing [facility].  Summary/Instructions:  Your prescription(s) have been submitted to your pharmacy or been printed and provided for you. Please take as directed and contact our office if you believe you are having problem(s) with the medication(s) or have any questions.  If your symptoms worsen or fail to improve, please contact our office for further instruction, or in case of emergency go directly to the emergency room at the closest medical facility.     

## 2014-03-13 NOTE — Progress Notes (Signed)
Pre visit review using our clinic review tool, if applicable. No additional management support is needed unless otherwise documented below in the visit note. 

## 2014-03-17 ENCOUNTER — Encounter: Payer: Self-pay | Admitting: Family

## 2014-03-17 DIAGNOSIS — M5440 Lumbago with sciatica, unspecified side: Secondary | ICD-10-CM

## 2014-03-18 ENCOUNTER — Encounter: Payer: Self-pay | Admitting: Family

## 2014-03-21 ENCOUNTER — Ambulatory Visit: Payer: Medicare Other | Attending: Family

## 2014-03-21 DIAGNOSIS — E785 Hyperlipidemia, unspecified: Secondary | ICD-10-CM | POA: Diagnosis not present

## 2014-03-21 DIAGNOSIS — M2560 Stiffness of unspecified joint, not elsewhere classified: Secondary | ICD-10-CM

## 2014-03-21 DIAGNOSIS — M545 Low back pain, unspecified: Secondary | ICD-10-CM

## 2014-03-21 DIAGNOSIS — M5380 Other specified dorsopathies, site unspecified: Secondary | ICD-10-CM | POA: Diagnosis not present

## 2014-03-21 DIAGNOSIS — R293 Abnormal posture: Secondary | ICD-10-CM | POA: Insufficient documentation

## 2014-03-21 DIAGNOSIS — Z8639 Personal history of other endocrine, nutritional and metabolic disease: Secondary | ICD-10-CM | POA: Insufficient documentation

## 2014-03-21 DIAGNOSIS — Z8546 Personal history of malignant neoplasm of prostate: Secondary | ICD-10-CM | POA: Diagnosis not present

## 2014-03-21 NOTE — Therapy (Signed)
[facility] [address] [place], [place], [address] Phone: [phone]   Fax:  [phone]  Physical Therapy Evaluation  Patient Details  Name: Tom Gaines MRN: [id number] Date of Birth: 02/16/1945 Referring Provider:  Calone, Gregory, FNP  Encounter Date: 03/21/2014      PT End of Session - 03/21/14 0935    Visit Number 1   Number of Visits 12   Date for PT Re-Evaluation 05/02/14   PT Start Time 0840   PT Stop Time 0930   PT Time Calculation (min) 50 min   Activity Tolerance Patient tolerated treatment well   Behavior During Therapy WFL for tasks assessed/performed      Past Medical History  Diagnosis Date  . Gout   . Prostate cancer 1998    Past Surgical History  Procedure Laterality Date  . Prostatectomy  1998  . Lumbar disc surgery  2002    Dr Elsner  . Tonsillectomy and adenoidectomy    . Shoulder arthroscopy      right; 2011; left 1990  . Colonoscopy with polypectomy  2014    Dr Stark    There were no vitals taken for this visit.  Visit Diagnosis:  Joint stiffness of spine - Plan: PT plan of care cert/re-cert  Abnormal posture - Plan: PT plan of care cert/re-cert  Left-sided low back pain without sciatica - Plan: PT plan of care cert/re-cert      Subjective Assessment - 03/21/14 0849    Symptoms He reports he reports pain/tightness in lower back and above LT hip a Knot and pain in LT groin. He has taken meds with some benefit but groin pain continues.    Pertinent History He reports shoveling 4 weeks ago and 10 days later he began to have pain. He shoveled during last snow and began to have more back pain after sitting down. [place] works [employer].  He has had disc surgery in past (2002?)    He has been doing yoga and pain improved in past.   He is able to do a set of back exercises without pain.    Limitations Standing  gettting out of bed, with back pain.    Diagnostic tests None   Patient  Stated Goals Eliminate pain   Currently in Pain? Yes   Pain Score 1    Pain Location Back   Pain Orientation Left   Pain Descriptors / Indicators Aching;Tightness   Pain Type --  Sub acute   Pain Radiating Towards LT groin   Pain Onset 1 to 4 weeks ago   Pain Frequency Intermittent   Aggravating Factors  standing and getting out of bed   Pain Relieving Factors Medication   Effect of Pain on Daily Activities Limits standing activity   Multiple Pain Sites No          [facility] PT Assessment - 03/21/14 0858    Assessment   Medical Diagnosis LBP with pain in Lt hip   Onset Date --  January 2016   Precautions   Precautions None   Restrictions   Weight Bearing Restrictions No   Balance Screen   Has the patient fallen in the past 6 months No   Has the patient had a decrease in activity level because of a fear of falling?  No   Is the patient reluctant to leave their home because of a fear of falling?  No   Posture/Postural Control   Posture Comments RT scapula   lower , pelvis level   AROM   Lumbar Flexion he is able to touch his mid tibia   Lumbar Extension decreased 1/3   Lumbar - Right Side Bend 35 degrees   Lumbar - Left Side Bend 23 degrees   PROM   Left Hip Extension --  WNL   Left Hip Flexion --  WNL   Left Hip External Rotation  --  WNL   Left Hip Internal Rotation  --  WNL   Left Hip ABduction --  WNL   Left Hip ADduction --  WNL   Strength   Overall Strength Comments WNl LE and abdominals   Palpation   Palpation LT Asis higher and PSIS lower with LT luumar TP lower with spin in extension                  [facility] Adult PT Treatment/Exercise - 03/21/14 0858    Manual Therapy   Manual Therapy --  MET for posterior LT ilia level post technique                PT Education - 03/21/14 0935    Education provided Yes   Education Details POC and limiting bending   Person(s) Educated Patient   Methods Explanation;Verbal cues;Handout;Demonstration    Comprehension Verbalized understanding          PT Short Term Goals - 03/21/14 0940    PT SHORT TERM GOAL #1   Title independent with HEP, inital   Time 3   Period Weeks   Status New   PT SHORT TERM GOAL #2   Title He will report 25% less pain with standing activity   Time 3   Period Weeks   Status New   PT SHORT TERM GOAL #3   Title Improve active Lt and RT side bending to 40 degrees   Time 3   Period Weeks   Status New           PT Long Term Goals - 03/21/14 0942    PT LONG TERM GOAL #3   Title He will report getting out of bed with 50% decreased pain   Time 6   Period Weeks   Status New               Plan - 03/21/14 0936    Clinical Impression Statement LT sided lower back pain. I was unable to replicate his LT groin pain.Stiffness in lower back . Strength and LE flexibility is normal   Pt will benefit from skilled therapeutic intervention in order to improve on the following deficits Pain;Decreased activity tolerance;Postural dysfunction;Decreased range of motion   Rehab Potential Good   PT Frequency 2x / week   PT Duration 6 weeks  will see 6 vissit and if not improved with return him to MD   PT Treatment/Interventions Cryotherapy;Ultrasound;Moist Heat;Therapeutic exercise;Patient/family education;Passive range of motion;Manual techniques;Traction;Dry needling   PT Next Visit Plan Mobs , MET if needed, stretching, modalities , look at weight shift with foreward flexion   PT Home Exercise Plan Stretching back with sidebending   Consulted and Agree with Plan of Care Patient         Problem List Patient Active Problem List   Diagnosis Date Noted  . Low back pain 03/13/2014  . Basal cell carcinoma 02/13/2013  . Hyperlipidemia 03/12/2008  . History of gout 03/12/2008  . PROSTATE CANCER, HX OF 03/12/2008  . History of colonic polyps 03/12/2008    Chasse, Stephen M   PT 03/21/2014, 9:49 AM  [facility] [address] [place], [place], [address] Phone: [phone]   Fax:  [phone]     

## 2014-03-21 NOTE — Patient Instructions (Signed)
I asked him to limit bending and twisting at home

## 2014-04-01 ENCOUNTER — Ambulatory Visit: Payer: Medicare Other | Attending: Family

## 2014-04-01 DIAGNOSIS — M545 Low back pain, unspecified: Secondary | ICD-10-CM

## 2014-04-01 DIAGNOSIS — M2560 Stiffness of unspecified joint, not elsewhere classified: Secondary | ICD-10-CM

## 2014-04-01 DIAGNOSIS — E785 Hyperlipidemia, unspecified: Secondary | ICD-10-CM | POA: Diagnosis not present

## 2014-04-01 DIAGNOSIS — Z8546 Personal history of malignant neoplasm of prostate: Secondary | ICD-10-CM | POA: Insufficient documentation

## 2014-04-01 DIAGNOSIS — Z8639 Personal history of other endocrine, nutritional and metabolic disease: Secondary | ICD-10-CM | POA: Diagnosis not present

## 2014-04-01 DIAGNOSIS — R293 Abnormal posture: Secondary | ICD-10-CM | POA: Diagnosis not present

## 2014-04-01 DIAGNOSIS — M5380 Other specified dorsopathies, site unspecified: Secondary | ICD-10-CM | POA: Insufficient documentation

## 2014-04-01 NOTE — Therapy (Signed)
[place], [place], [address] Phone: [phone]   Fax:  [phone]  Physical Therapy Treatment  Patient Details  Name: Tom Gaines MRN: [id number] Date of Birth: [birth year] Referring Provider:  Hendricks Limes, MD  Encounter Date: 04/01/2014      PT End of Session - 04/01/14 1502    Visit Number 2   Number of Visits 12   Date for PT Re-Evaluation 05/02/14   PT Start Time 0215   PT Stop Time 0300   PT Time Calculation (min) 45 min   Activity Tolerance Patient tolerated treatment well   Behavior During Therapy [facility] for tasks assessed/performed      Past Medical History  Diagnosis Date  . Gout   . Prostate cancer 1998    Past Surgical History  Procedure Laterality Date  . Prostatectomy  1998  . Lumbar disc surgery  2002    Dr Ellene Route  . Tonsillectomy and adenoidectomy    . Shoulder arthroscopy      right; 2011; left 1990  . Colonoscopy with polypectomy  2014    Dr Fuller Plan    There were no vitals taken for this visit.  Visit Diagnosis:  Joint stiffness of spine  Left-sided low back pain without sciatica      Subjective Assessment - 04/01/14 1416    Symptoms Back is fine today. LT groin pain after standing . Felt like heat only onm LT. Occasional buttock pain into lateral thigh and lower leg.None of this is pain but doesnt' feel like pain.                     [facility] Adult PT Treatment/Exercise - 04/01/14 1419    Manual Therapy   Manual Therapy Joint mobilization;Other (comment);Passive ROM   Joint Mobilization PA glides to L1-5 Gr 4, MET for posterior LTilia, and PA pressure to LT lower sacrum   Other Manual Therapy STW to LT gluteals with rotation of LT hip and pressur to muscles. Long axis pulls with rotation .                 PT Education - 04/01/14 1501    Education provided Yes   Education Details instructed about what was done i treatment and why, used  model of spine and pelvis and book to describe anatomy and possible cause of symptoms   Person(s) Educated Patient   Methods Explanation;Demonstration   Comprehension Verbalized understanding          PT Short Term Goals - 03/21/14 0940    PT SHORT TERM GOAL #1   Title independent with HEP, inital   Time 3   Period Weeks   Status New   PT SHORT TERM GOAL #2   Title He will report 25% less pain with standing activity   Time 3   Period Weeks   Status New   PT SHORT TERM GOAL #3   Title Improve active Lt and RT side bending to 40 degrees   Time 3   Period Weeks   Status New           PT Long Term Goals - 03/21/14 2924    PT LONG TERM GOAL #3   Title He will report getting out of bed with 50% decreased pain   Time 6   Period Weeks   Status New               Plan - 04/01/14  1502    Clinical Impression Statement Back pain reolving but funny feeling and burning sensation in groin and buttock primary problem.    PT Next Visit Plan Mobs , MET if needed, stretching, modalities , look at weight shift with foreward flexion  CHECK sidebend RT to LT   PT Home Exercise Plan Stretching back with sidebending   Consulted and Agree with Plan of Care Patient        Problem List Patient Active Problem List   Diagnosis Date Noted  . Low back pain 03/13/2014  . Basal cell carcinoma 02/13/2013  . Hyperlipidemia 03/12/2008  . History of gout 03/12/2008  . PROSTATE CANCER, HX OF 03/12/2008  . History of colonic polyps 03/12/2008    Darrel Hoover PT 04/01/2014, 3:04 PM  [facility] [address] [place], [place], [address] Phone: [phone]   Fax:  [phone]

## 2014-04-03 ENCOUNTER — Ambulatory Visit: Payer: Medicare Other

## 2014-04-03 DIAGNOSIS — M2560 Stiffness of unspecified joint, not elsewhere classified: Secondary | ICD-10-CM

## 2014-04-03 DIAGNOSIS — M5380 Other specified dorsopathies, site unspecified: Secondary | ICD-10-CM | POA: Diagnosis not present

## 2014-04-03 DIAGNOSIS — M545 Low back pain, unspecified: Secondary | ICD-10-CM

## 2014-04-03 DIAGNOSIS — Z8546 Personal history of malignant neoplasm of prostate: Secondary | ICD-10-CM | POA: Diagnosis not present

## 2014-04-03 DIAGNOSIS — Z8639 Personal history of other endocrine, nutritional and metabolic disease: Secondary | ICD-10-CM | POA: Diagnosis not present

## 2014-04-03 DIAGNOSIS — E785 Hyperlipidemia, unspecified: Secondary | ICD-10-CM | POA: Diagnosis not present

## 2014-04-03 DIAGNOSIS — R293 Abnormal posture: Secondary | ICD-10-CM | POA: Diagnosis not present

## 2014-04-03 DIAGNOSIS — R29898 Other symptoms and signs involving the musculoskeletal system: Secondary | ICD-10-CM

## 2014-04-03 NOTE — Therapy (Signed)
[place], [place], [address] Phone: [phone]   Fax:  [phone]  Physical Therapy Treatment  Patient Details  Name: Tom Gaines MRN: [id number] Date of Birth: 24-Jul-1945 Referring Provider:  Hendricks Limes, MD  Encounter Date: 04/03/2014      PT End of Session - 04/03/14 1508    Visit Number 3   Number of Visits 12   Date for PT Re-Evaluation 05/02/14   PT Start Time 0215   PT Stop Time 0305   PT Time Calculation (min) 50 min   Activity Tolerance Patient tolerated treatment well  no pain   Behavior During Therapy [facility] for tasks assessed/performed      Past Medical History  Diagnosis Date  . Gout   . Prostate cancer 1998    Past Surgical History  Procedure Laterality Date  . Prostatectomy  1998  . Lumbar disc surgery  2002    Dr Ellene Route  . Tonsillectomy and adenoidectomy    . Shoulder arthroscopy      right; 2011; left 1990  . Colonoscopy with polypectomy  2014    Dr Fuller Plan    There were no vitals taken for this visit.  Visit Diagnosis:  Joint stiffness of spine  Left-sided low back pain without sciatica  Decreased strength of trunk and back      Subjective Assessment - 04/03/14 1415    Symptoms Back fine today. Adjusted heellift down one layer and wore all day without problem but woke  without pain    Currently in Pain? No/denies   Multiple Pain Sites No                    [facility] Adult PT Treatment/Exercise - 04/03/14 1433    Exercises   Exercises Lumbar;Knee/Hip   Lumbar Exercises: Supine   Bridge 10 reps   Bridge Limitations shoulder bridge   Other Supine Lumbar Exercises leg circles RT and LT    Other Supine Lumbar Exercises 100's     HEP Pliates.            PT Education - 04/03/14 1508    Education provided No   Education Details pilates scissors, 100's,legcircles   Person(s) Educated Patient   Methods Demonstration;Tactile cues;Verbal  cues;Handout;Explanation   Comprehension Verbalized understanding;Returned demonstration          PT Short Term Goals - 03/21/14 0940    PT SHORT TERM GOAL #1   Title independent with HEP, inital   Time 3   Period Weeks   Status New   PT SHORT TERM GOAL #2   Title He will report 25% less pain with standing activity   Time 3   Period Weeks   Status New   PT SHORT TERM GOAL #3   Title Improve active Lt and RT side bending to 40 degrees   Time 3   Period Weeks   Status New           PT Long Term Goals - 03/21/14 7353    PT LONG TERM GOAL #3   Title He will report getting out of bed with 50% decreased pain   Time 6   Period Weeks   Status New               Plan - 04/03/14 1509    Clinical Impression Statement Continues without pain and able to perform HEP with minnor cues after instruction.    PT Next Visit  Plan Half and or tall kneeling, double leg hinge hip   PT Home Exercise Plan Pilates   Consulted and Agree with Plan of Care Patient        Problem List Patient Active Problem List   Diagnosis Date Noted  . Low back pain 03/13/2014  . Basal cell carcinoma 02/13/2013  . Hyperlipidemia 03/12/2008  . History of gout 03/12/2008  . PROSTATE CANCER, HX OF 03/12/2008  . History of colonic polyps 03/12/2008    Darrel Hoover PT 04/03/2014, 3:11 PM  [facility] [address] [place], [place], [address] Phone: [phone]   Fax:  [phone]

## 2014-04-03 NOTE — Patient Instructions (Signed)
Issued handouts and instructed in 100's, scissors and leg circles for home. 3-5x/week and 10-20 reps

## 2014-04-07 ENCOUNTER — Other Ambulatory Visit: Payer: Self-pay | Admitting: Internal Medicine

## 2014-04-07 ENCOUNTER — Encounter: Payer: Self-pay | Admitting: Internal Medicine

## 2014-04-07 MED ORDER — SULFAMETHOXAZOLE-TRIMETHOPRIM 800-160 MG PO TABS: 1.0000 | ORAL_TABLET | Freq: Two times a day (BID) | ORAL | Status: DC

## 2014-04-07 NOTE — Telephone Encounter (Signed)
Patient states he will be back in [place] and would like rx called to [facility] on [address], not in [place].

## 2014-04-08 ENCOUNTER — Ambulatory Visit: Payer: Medicare Other

## 2014-04-08 DIAGNOSIS — R293 Abnormal posture: Secondary | ICD-10-CM | POA: Diagnosis not present

## 2014-04-08 DIAGNOSIS — R29898 Other symptoms and signs involving the musculoskeletal system: Secondary | ICD-10-CM

## 2014-04-08 DIAGNOSIS — M5380 Other specified dorsopathies, site unspecified: Secondary | ICD-10-CM | POA: Diagnosis not present

## 2014-04-08 DIAGNOSIS — Z8639 Personal history of other endocrine, nutritional and metabolic disease: Secondary | ICD-10-CM | POA: Diagnosis not present

## 2014-04-08 DIAGNOSIS — M545 Low back pain: Secondary | ICD-10-CM | POA: Diagnosis not present

## 2014-04-08 DIAGNOSIS — E785 Hyperlipidemia, unspecified: Secondary | ICD-10-CM | POA: Diagnosis not present

## 2014-04-08 DIAGNOSIS — Z8546 Personal history of malignant neoplasm of prostate: Secondary | ICD-10-CM | POA: Diagnosis not present

## 2014-04-08 NOTE — Therapy (Signed)
[place], [place], [address] Phone: [phone]   Fax:  [phone]  Physical Therapy Treatment  Patient Details  Name: Tom Gaines MRN: [id number] Date of Birth: 03-29-1945 Referring Provider:  Hendricks Limes, MD  Encounter Date: 04/08/2014      PT End of Session - 04/08/14 1503    Visit Number 4   Number of Visits 12   Date for PT Re-Evaluation 05/02/14   PT Start Time 0215   PT Stop Time 0300   PT Time Calculation (min) 45 min   Activity Tolerance Patient tolerated treatment well   Behavior During Therapy [facility] for tasks assessed/performed      Past Medical History  Diagnosis Date  . Gout   . Prostate cancer 1998    Past Surgical History  Procedure Laterality Date  . Prostatectomy  1998  . Lumbar disc surgery  2002    Dr Ellene Route  . Tonsillectomy and adenoidectomy    . Shoulder arthroscopy      right; 2011; left 1990  . Colonoscopy with polypectomy  2014    Dr Fuller Plan    There were no vitals taken for this visit.  Visit Diagnosis:  Decreased strength of trunk and back      Subjective Assessment - 04/08/14 1423    Symptoms occasional twinge in LT hip otherwise back fine   Currently in Pain? No/denies   Multiple Pain Sites No                    [facility] Adult PT Treatment/Exercise - 04/08/14 1425    Lumbar Exercises: Supine   Bent Knee Raise 5 reps   Bent Knee Raise Limitations single and 5 reps 2 leg.    Bridge 5 reps;10 reps   Bridge Limitations shoulder bridge   Other Supine Lumbar Exercises leg circles RT and LT  10 reps counter and clockwise 2 sets   Other Supine Lumbar Exercises 100's    Knee/Hip Exercises: Sidelying   Hip ABduction Right;Left;AROM;1 set;10 reps   Hip ABduction Limitations straight lifts and with Int and Ext rotation with side leg lift                PT Education - 04/08/14 1503    Education provided Yes   Education Details HEp  with side lye exercises   Person(s) Educated Patient   Methods Explanation;Demonstration;Tactile cues;Handout;Verbal cues   Comprehension Verbalized understanding;Returned demonstration          PT Short Term Goals - 03/21/14 0940    PT SHORT TERM GOAL #1   Title independent with HEP, inital   Time 3   Period Weeks   Status New   PT SHORT TERM GOAL #2   Title He will report 25% less pain with standing activity   Time 3   Period Weeks   Status New   PT SHORT TERM GOAL #3   Title Improve active Lt and RT side bending to 40 degrees   Time 3   Period Weeks   Status New           PT Long Term Goals - 03/21/14 2836    PT LONG TERM GOAL #3   Title He will report getting out of bed with 50% decreased pain   Time 6   Period Weeks   Status New               Plan - 04/08/14 1503  Clinical Impression Statement No pain and progressing with HEP. May discharge next visit   PT Next Visit Plan review exercise and probable discharge        Problem List Patient Active Problem List   Diagnosis Date Noted  . Low back pain 03/13/2014  . Basal cell carcinoma 02/13/2013  . Hyperlipidemia 03/12/2008  . History of gout 03/12/2008  . PROSTATE CANCER, HX OF 03/12/2008  . History of colonic polyps 03/12/2008    Darrel Hoover PT 04/08/2014, 3:04 PM  [facility] [address] [place], [place], [address] Phone: [phone]   Fax:  [phone]

## 2014-04-08 NOTE — Patient Instructions (Signed)
Clamshells and hip abduction side lye 10 reps daily, also with hip rotation..Progressed scissors and shoulder bridge.

## 2014-04-10 ENCOUNTER — Ambulatory Visit: Payer: Medicare Other

## 2014-04-10 DIAGNOSIS — E785 Hyperlipidemia, unspecified: Secondary | ICD-10-CM | POA: Diagnosis not present

## 2014-04-10 DIAGNOSIS — Z8639 Personal history of other endocrine, nutritional and metabolic disease: Secondary | ICD-10-CM | POA: Diagnosis not present

## 2014-04-10 DIAGNOSIS — R293 Abnormal posture: Secondary | ICD-10-CM | POA: Diagnosis not present

## 2014-04-10 DIAGNOSIS — R29898 Other symptoms and signs involving the musculoskeletal system: Secondary | ICD-10-CM

## 2014-04-10 DIAGNOSIS — M5380 Other specified dorsopathies, site unspecified: Secondary | ICD-10-CM | POA: Diagnosis not present

## 2014-04-10 DIAGNOSIS — M545 Low back pain: Secondary | ICD-10-CM | POA: Diagnosis not present

## 2014-04-10 DIAGNOSIS — Z8546 Personal history of malignant neoplasm of prostate: Secondary | ICD-10-CM | POA: Diagnosis not present

## 2014-04-10 NOTE — Patient Instructions (Addendum)
Hip Extension: Hamstring Single Leg Deadlift (Eccentric)   Holding weights, stand on affected leg with knee slightly flexed. Lift other leg while slowly bending forward at the hip. Use ___ lb weight. ___ reps per set, ___ sets per day, ___ days per week. Add ___ lbs when you achieve ___ repetitions. Touch floor with weight.  Copyright  VHI. All rights reserved.  Squat: Wide Leg   Feet wide apart, toes out, squat, holding weight between knees. Weight should be at ankles.  Bend at the hips with hips going back behind hips. Keep back straight.  Put eight on stool if you cannot get to floor.  Repeat _3-15___ times per set. Do ___1-2_ sets per session. Do __2-5__ sessions per week. Use __0-10__ lb weight.  Copyright  VHI. All rights reserved.  Squat: Half   Arms hanging at sides, squat by dropping hips back as if sitting on a chair. Keep knees over ankles, hips behind heels.  Keep back straight.  Bend at hips and knees will bend with hips.   Repeat ___3-15_ times per set. Do __1-2__ sets per session. Do __2-5__ sessions per week. Use __0-10HIP / KNEE: Flexion / Extension, Squat Unilateral Hip / Glute Extension: Standing - Straight Leg (Machine) Hip Extension (Standing) Healthy Back - Yoga Tree Balance Also added side plank over corner of bed with arm and leg movements.  5-10 arm or leg movements.  Cued to align body.

## 2014-04-10 NOTE — Therapy (Signed)
[place], [place], [address] Phone: [phone]   Fax:  [phone]  Physical Therapy Treatment  Patient Details  Name: Tom Gaines MRN: [id number] Date of Birth: 1945-07-30 Referring Provider:  Hendricks Limes, MD  Encounter Date: 04/10/2014      PT End of Session - 04/10/14 1514    Visit Number 5   Number of Visits 12   Date for PT Re-Evaluation 05/02/14   PT Start Time 0215   PT Stop Time 0310   PT Time Calculation (min) 55 min   Activity Tolerance Patient tolerated treatment well   Behavior During Therapy [facility] for tasks assessed/performed      Past Medical History  Diagnosis Date  . Gout   . Prostate cancer 1998    Past Surgical History  Procedure Laterality Date  . Prostatectomy  1998  . Lumbar disc surgery  2002    Dr Ellene Route  . Tonsillectomy and adenoidectomy    . Shoulder arthroscopy      right; 2011; left 1990  . Colonoscopy with polypectomy  2014    Dr Fuller Plan    There were no vitals filed for this visit.  Visit Diagnosis:  Decreased strength of trunk and back      Subjective Assessment - 04/10/14 1517    Symptoms No pain today. no problems with exericse   Currently in Pain? No/denies   Multiple Pain Sites No                       [facility] Adult PT Treatment/Exercise - 04/10/14 1518    Lumbar Exercises: Supine   Other Supine Lumbar Exercises   20 reps   Knee/Hip Exercises: Standing   Other Standing Knee Exercises Hip hinges double and single for HEP and PT cued for technique   Knee/Hip Exercises: Sidelying   Other Sidelying Knee Exercises sideplanks over corner of reformer RT and LT with arm movements    Reformer work with feet, 100's modification and SLR pulls in supine. PT porvided verbal/tactile cues and demo for patient            PT Education - 04/10/14 1514    Education provided Yes   Education Details HEP   Person(s) Educated Patient   Methods Explanation;Demonstration;Verbal cues;Handout;Tactile cues   Comprehension Returned demonstration          PT Short Term Goals - 04/10/14 1516    PT SHORT TERM GOAL #1   Title independent with HEP, inital   Status Achieved   PT SHORT TERM GOAL #2   Title He will report 25% less pain with standing activity   Status Achieved   PT SHORT TERM GOAL #3   Title Improve active Lt and RT side bending to 40 degrees   Status On-going           PT Long Term Goals - 04/10/14 1516    PT LONG TERM GOAL #2   Title Pain improve 75% or mroe with standing activity   Status Achieved   PT LONG TERM GOAL #3   Title He will report getting out of bed with 50% decreased pain   Status Achieved               Plan - 04/10/14 1515    Clinical Impression Statement He continues with no pain even with more vigorous exercises.    PT Next Visit Plan Review HEP   Need review next  visit of hip hinge so will see 1-2x next week then discharge   Consulted and Agree with Plan of Care Patient        Problem List Patient Active Problem List   Diagnosis Date Noted  . Low back pain 03/13/2014  . Basal cell carcinoma 02/13/2013  . Hyperlipidemia 03/12/2008  . History of gout 03/12/2008  . PROSTATE CANCER, HX OF 03/12/2008  . History of colonic polyps 03/12/2008    Darrel Hoover PT 04/10/2014, 3:21 PM  [facility] [address] [place], [place], [address] Phone: [phone]   Fax:  [phone]

## 2014-04-15 ENCOUNTER — Ambulatory Visit: Payer: Medicare Other

## 2014-04-15 ENCOUNTER — Encounter: Payer: Self-pay | Admitting: Internal Medicine

## 2014-04-15 DIAGNOSIS — R29898 Other symptoms and signs involving the musculoskeletal system: Secondary | ICD-10-CM

## 2014-04-15 DIAGNOSIS — Z8546 Personal history of malignant neoplasm of prostate: Secondary | ICD-10-CM | POA: Diagnosis not present

## 2014-04-15 DIAGNOSIS — Z8639 Personal history of other endocrine, nutritional and metabolic disease: Secondary | ICD-10-CM | POA: Diagnosis not present

## 2014-04-15 DIAGNOSIS — M545 Low back pain: Secondary | ICD-10-CM | POA: Diagnosis not present

## 2014-04-15 DIAGNOSIS — M5380 Other specified dorsopathies, site unspecified: Secondary | ICD-10-CM | POA: Diagnosis not present

## 2014-04-15 DIAGNOSIS — R293 Abnormal posture: Secondary | ICD-10-CM | POA: Diagnosis not present

## 2014-04-15 DIAGNOSIS — E785 Hyperlipidemia, unspecified: Secondary | ICD-10-CM | POA: Diagnosis not present

## 2014-04-15 NOTE — Therapy (Signed)
[place], [place], [address] Phone: [phone]   Fax:  [phone]  Physical Therapy Treatment  Patient Details  Name: Tom Gaines MRN: [id number] Date of Birth: 05-28-1945 Referring Provider:  Hendricks Limes, MD  Encounter Date: 20-Apr-2014      PT End of Session - Apr 20, 2014 1554    Visit Number 6   Number of Visits 12   PT Start Time 0215   PT Stop Time 0300   PT Time Calculation (min) 45 min   Activity Tolerance Patient tolerated treatment well   Behavior During Therapy [facility] for tasks assessed/performed      Past Medical History  Diagnosis Date  . Gout   . Prostate cancer 1998    Past Surgical History  Procedure Laterality Date  . Prostatectomy  1998  . Lumbar disc surgery  2002    Dr Ellene Route  . Tonsillectomy and adenoidectomy    . Shoulder arthroscopy      right; 2011; left 1990  . Colonoscopy with polypectomy  2014    Dr Fuller Plan    There were no vitals filed for this visit.  Visit Diagnosis:  Decreased strength of trunk and back      Subjective Assessment - 20-Apr-2014 1454    Currently in Pain? No/denies   Multiple Pain Sites No        Range equal in trunk though some what limited,. He is doing exercise of higher level without pain . He is ready to progress exercise on own or in structured class.    Treatment today was a progression /addition of  Pilates exercise for home                    PT Education - 04/20/14 1456    Education provided Yes   Education Details Pilates   Person(s) Educated Patient   Methods Explanation;Demonstration;Tactile cues;Verbal cues;Handout   Comprehension Returned demonstration          PT Short Term Goals - 04/20/2014 1556    PT SHORT TERM GOAL #1   Title independent with HEP, inital   Status Achieved   PT SHORT TERM GOAL #2   Title He will report 25% less pain with standing activity   Status Achieved   PT SHORT  TERM GOAL #3   Title Improve active Lt and RT side bending to 40 degrees   Status Achieved           PT Long Term Goals - 04-20-14 1556    PT LONG TERM GOAL #1   Title Independent wiht all HEP issued as of last visit   Status Achieved   PT LONG TERM GOAL #2   Title Pain improve 75% or mroe with standing activity   Status Achieved               Plan - 04-20-2014 1554    Clinical Impression Statement Discharge today with HEP and pt to try Pilates class for stabilization of core. No pain. He is abe to do all HEP corectly   PT Next Visit Plan None Discharge today   PT Home Exercise Plan pilates   Consulted and Agree with Plan of Care Patient          G-Codes - 04/20/2014 1712    Functional Limitation Mobility: Walking and moving around      Problem List Patient Active Problem List   Diagnosis Date Noted  . Low back pain  03/13/2014  . Basal cell carcinoma 02/13/2013  . Hyperlipidemia 03/12/2008  . History of gout 03/12/2008  . PROSTATE CANCER, HX OF 03/12/2008  . History of colonic polyps 03/12/2008    Darrel Hoover PT 04/15/2014, 5:18 PM  [facility] [address] [place], [place], [address] Phone: [phone]   Fax:  [phone]   PHYSICAL THERAPY DISCHARGE SUMMARY  Visits from Start of Care:6  Current functional level related to goals / functional outcomes: See Above   Remaining deficits: None. He has no pain and is doing a vigorous HEP. He will trial a Pilates class for core stability   Education / Equipment: HEP  Plan: Patient agrees to discharge.  Patient goals were met. Patient is being discharged due to meeting the stated rehab goals.  ?????

## 2014-04-15 NOTE — Patient Instructions (Signed)
Instructed in Pilates  Leg pull in prone, sitting roll back/up, saw, double leg stretch,hip twist  5-8 reps 3-4x/week hold 3-5 sec

## 2014-04-16 ENCOUNTER — Ambulatory Visit (INDEPENDENT_AMBULATORY_CARE_PROVIDER_SITE_OTHER): Payer: Medicare Other | Admitting: Internal Medicine

## 2014-04-16 ENCOUNTER — Encounter: Payer: Self-pay | Admitting: Internal Medicine

## 2014-04-16 VITALS — BP 112/86 | HR 85 | Temp 98.5°F | Ht 72.0 in | Wt 181.5 lb

## 2014-04-16 DIAGNOSIS — J0100 Acute maxillary sinusitis, unspecified: Secondary | ICD-10-CM

## 2014-04-16 MED ORDER — DOXYCYCLINE HYCLATE 100 MG PO TABS: 100.0000 mg | ORAL_TABLET | Freq: Two times a day (BID) | ORAL | Status: DC

## 2014-04-16 NOTE — Progress Notes (Signed)
   Subjective:    Patient ID: Tom Gaines, male    DOB: Oct 19, 1945, 69 y.o.   MRN: [id number]  HPI  He completed the 7 day course of generic Septra DS 04/15/14. He continued to have pressure in his ears. He  also had green/yellow nasal discharge. This was of significant volume 04/15/14 but decreased in volume yesterday.  With the symptoms he also had pain in the left maxillary sinus &  teeth  Review of Systems He denies extrinsic symptoms of itchy, watery eyes or sneezing at this time.  He has no shortness of breath or wheezing.  He also denies fever, chills, or sweats.      Objective:   Physical Exam  Pertinent positive findings include: The left tympanic membrane is dull.  There is mild debris in the left nare. The right nasal septum is slightly erythematous.  General appearance:Adequately nourished; no acute distress or increased work of breathing is present.  No  lymphadenopathy about the head, neck, or axilla noted.   Eyes: No conjunctival inflammation or lid edema is present. There is no scleral icterus.  Ears:  External ear exam shows no significant lesions or deformities.  Otoscopic examination reveals clear canals, tympanic membranes are intact bilaterally without bulging, retraction, inflammation or discharge.  Nose:  External nasal examination shows no deformity or inflammation.  No septal dislocation or deviation.No obstruction to airflow.   Oral exam: Dental hygiene is good; lips and gums are healthy appearing.There is no oropharyngeal erythema or exudate noted.   Neck:  No deformities, thyromegaly, masses, or tenderness noted.   Supple with full range of motion without pain.   Heart:  Normal rate and regular rhythm. S1 and S2 normal without gallop, murmur, click, rub or other extra sounds.   Lungs:Chest clear to auscultation; no wheezes, rhonchi,rales ,or rubs present.  Extremities:  No cyanosis, edema, or clubbing  noted    Skin: Warm & dry w/o jaundice or  tenting.      Assessment & Plan:  #1 resolving left maxillary sinusitis; status post seven-day course of generic Septra DS.  Plan: Aggressive nasal hygiene will be pursued in an attempt to continue the sinus clearing. Doxycycline will be prescribed if symptoms persist after 48-72 hours of this intervention.

## 2014-04-16 NOTE — Progress Notes (Signed)
Pre visit review using our clinic review tool, if applicable. No additional management support is needed unless otherwise documented below in the visit note. 

## 2014-04-16 NOTE — Patient Instructions (Addendum)
Plain Mucinex (NOT D) for thick secretions ;force NON dairy fluids .   Nasal cleansing in the shower as discussed with lather of mild shampoo.After 10 seconds wash off lather while  exhaling through nostrils. Make sure that all residual soap is removed to prevent irritation.  Flonase OR Nasacort AQ 1 spray in each nostril twice a day as needed. Use the "crossover" technique into opposite nostril spraying toward opposite ear @ 45 degree angle, not straight up into nostril.  Plain Allegra (NOT D )  160 daily , Loratidine 10 mg , OR Zyrtec 10 mg @ bedtime  as needed for itchy eyes & sneezing. Fill the  prescription for Doxycycline it there is not continued improvement in the next 48-72  hours.

## 2014-04-22 ENCOUNTER — Encounter: Payer: Self-pay | Admitting: Internal Medicine

## 2014-04-23 ENCOUNTER — Ambulatory Visit: Payer: Medicare Other

## 2014-05-11 ENCOUNTER — Other Ambulatory Visit: Payer: Self-pay | Admitting: Internal Medicine

## 2014-05-20 DIAGNOSIS — R972 Elevated prostate specific antigen [PSA]: Secondary | ICD-10-CM | POA: Diagnosis not present

## 2014-05-23 ENCOUNTER — Encounter: Payer: Self-pay | Admitting: Internal Medicine

## 2014-05-26 DIAGNOSIS — C61 Malignant neoplasm of prostate: Secondary | ICD-10-CM | POA: Diagnosis not present

## 2014-05-28 ENCOUNTER — Encounter: Payer: Self-pay | Admitting: Internal Medicine

## 2014-06-10 ENCOUNTER — Encounter: Payer: Self-pay | Admitting: Internal Medicine

## 2014-06-12 DIAGNOSIS — M25511 Pain in right shoulder: Secondary | ICD-10-CM | POA: Diagnosis not present

## 2014-06-12 DIAGNOSIS — M546 Pain in thoracic spine: Secondary | ICD-10-CM | POA: Diagnosis not present

## 2014-06-17 ENCOUNTER — Other Ambulatory Visit (INDEPENDENT_AMBULATORY_CARE_PROVIDER_SITE_OTHER): Payer: Medicare Other

## 2014-06-17 DIAGNOSIS — E785 Hyperlipidemia, unspecified: Secondary | ICD-10-CM | POA: Diagnosis not present

## 2014-06-17 LAB — LIPID PANEL
Cholesterol: 184 mg/dL (ref 0–200)
HDL: 49.3 mg/dL (ref >39.00)
NonHDL: 134.7
Total CHOL/HDL Ratio: 4
Triglycerides: 250 mg/dL — ABNORMAL HIGH (ref 0.0–149.0)
VLDL: 50 mg/dL — ABNORMAL HIGH (ref 0.0–40.0)

## 2014-06-17 LAB — LDL CHOLESTEROL, DIRECT: Direct LDL: 108 mg/dL

## 2014-06-19 ENCOUNTER — Encounter: Payer: Self-pay | Admitting: Internal Medicine

## 2014-06-19 ENCOUNTER — Ambulatory Visit (INDEPENDENT_AMBULATORY_CARE_PROVIDER_SITE_OTHER): Payer: Medicare Other | Admitting: Internal Medicine

## 2014-06-19 VITALS — BP 124/72 | HR 87 | Temp 97.6°F | Wt 192.0 lb

## 2014-06-19 DIAGNOSIS — E782 Mixed hyperlipidemia: Secondary | ICD-10-CM | POA: Diagnosis not present

## 2014-06-19 MED ORDER — FENOFIBRATE 145 MG PO TABS: 145.0000 mg | ORAL_TABLET | Freq: Every day | ORAL | Status: DC

## 2014-06-19 NOTE — Progress Notes (Signed)
Pre visit review using our clinic review tool, if applicable. No additional management support is needed unless otherwise documented below in the visit note. 

## 2014-06-19 NOTE — Progress Notes (Signed)
   Subjective:    Patient ID: Tom Gaines, male    DOB: 1945/09/12, 69 y.o.   MRN: [id number]  HPI  He is here to discuss elevated triglycerides. They have improved but still are in the 250 range. He has been avoiding HFCS sugars.& following a heart healthy diet. He exercises 5 hours a week without cardio pulmonary symptoms.  At one time triglycerides were over 300 and this was attributed to indigestion of ice cream. He has avoided such in the last several months. He also has had no alcohol for 4 years.  His mother and sister had adult onset diabetes. There is no family history of myocardial infarction or stroke.  Review of Systems   Chest pain, palpitations, tachycardia, exertional dyspnea, paroxysmal nocturnal dyspnea, claudication or edema are absent.      Objective:   Physical Exam  Appears healthy and well-nourished & in no acute distress  No carotid bruits are present.No neck vein distention present at 10 - 15 degrees. Thyroid normal to palpation  Heart rhythm and rate are normal with no gallop .Grade 2/9-5 systolic  murmur  Chest is clear with no increased work of breathing  There is no evidence of aortic aneurysm or renal artery bruits  Abdomen soft with no organomegaly or masses. No HJR  No clubbing, cyanosis or edema present.  Pedal pulses are intact   No ischemic skin changes are present . Fingernails/ toenails healthy   Alert and oriented. Strength, tone, DTRs reflexes normal        Assessment & Plan:  #1 mixed dyslipidemia  Plan: Nutritional interventions were discussed. He was given the option of taking fenofibrate. He wishes to further recheck the fasting lipids in 6 months.

## 2014-06-19 NOTE — Patient Instructions (Signed)
Please follow a Mediaterranean type diet  (many good cook books readily available) or review Dr Nunzio Cory book Eat, [place] for best  dietary cholesterol information & options.   Continue excellent cardiovascular exercise program 30-45 minutes 3-4 times per week.  If you do start the medication; fasting labs will be done after 10 weeks. If the medication is not started recheck fasting lipids in 6 months.

## 2014-06-23 ENCOUNTER — Encounter: Payer: Self-pay | Admitting: Internal Medicine

## 2014-07-15 DIAGNOSIS — D3131 Benign neoplasm of right choroid: Secondary | ICD-10-CM | POA: Diagnosis not present

## 2014-07-15 DIAGNOSIS — H25811 Combined forms of age-related cataract, right eye: Secondary | ICD-10-CM | POA: Diagnosis not present

## 2014-07-15 DIAGNOSIS — H43813 Vitreous degeneration, bilateral: Secondary | ICD-10-CM | POA: Diagnosis not present

## 2014-07-15 DIAGNOSIS — H31091 Other chorioretinal scars, right eye: Secondary | ICD-10-CM | POA: Diagnosis not present

## 2014-07-15 DIAGNOSIS — H2512 Age-related nuclear cataract, left eye: Secondary | ICD-10-CM | POA: Diagnosis not present

## 2014-07-15 DIAGNOSIS — H11132 Conjunctival pigmentations, left eye: Secondary | ICD-10-CM | POA: Diagnosis not present

## 2014-07-28 ENCOUNTER — Other Ambulatory Visit: Payer: Self-pay

## 2014-07-30 DIAGNOSIS — M1009 Idiopathic gout, multiple sites: Secondary | ICD-10-CM | POA: Diagnosis not present

## 2014-07-30 DIAGNOSIS — M6700 Short Achilles tendon (acquired), unspecified ankle: Secondary | ICD-10-CM | POA: Diagnosis not present

## 2014-07-30 DIAGNOSIS — M2020 Hallux rigidus, unspecified foot: Secondary | ICD-10-CM | POA: Diagnosis not present

## 2014-08-20 ENCOUNTER — Encounter: Payer: Self-pay | Admitting: Internal Medicine

## 2014-09-01 DIAGNOSIS — R972 Elevated prostate specific antigen [PSA]: Secondary | ICD-10-CM | POA: Diagnosis not present

## 2014-09-05 ENCOUNTER — Telehealth: Payer: Self-pay | Admitting: Emergency Medicine

## 2014-09-05 ENCOUNTER — Other Ambulatory Visit (INDEPENDENT_AMBULATORY_CARE_PROVIDER_SITE_OTHER): Payer: Medicare Other

## 2014-09-05 DIAGNOSIS — E785 Hyperlipidemia, unspecified: Secondary | ICD-10-CM | POA: Diagnosis not present

## 2014-09-05 LAB — HEPATIC FUNCTION PANEL
ALK PHOS: 48 U/L (ref 39–117)
ALT: 20 U/L (ref 0–53)
AST: 28 U/L (ref 0–37)
Albumin: 4.6 g/dL (ref 3.5–5.2)
Bilirubin, Direct: 0.1 mg/dL (ref 0.0–0.3)
TOTAL PROTEIN: 7.4 g/dL (ref 6.0–8.3)
Total Bilirubin: 0.5 mg/dL (ref 0.2–1.2)

## 2014-09-05 LAB — LIPID PANEL
CHOL/HDL RATIO: 3
Cholesterol: 178 mg/dL (ref 0–200)
HDL: 59 mg/dL (ref >39.00)
LDL CALC: 105 mg/dL — AB (ref 0–99)
NonHDL: 119.15
Triglycerides: 69 mg/dL (ref 0.0–149.0)
VLDL: 13.8 mg/dL (ref 0.0–40.0)

## 2014-09-05 NOTE — Telephone Encounter (Signed)
Fasting labs re entered

## 2014-09-07 ENCOUNTER — Encounter: Payer: Self-pay | Admitting: Internal Medicine

## 2014-09-10 ENCOUNTER — Encounter: Payer: Self-pay | Admitting: Internal Medicine

## 2014-09-10 NOTE — Telephone Encounter (Signed)
Please advise 

## 2014-09-11 ENCOUNTER — Other Ambulatory Visit: Payer: Self-pay | Admitting: Emergency Medicine

## 2014-09-11 DIAGNOSIS — E782 Mixed hyperlipidemia: Secondary | ICD-10-CM

## 2014-09-11 MED ORDER — FENOFIBRATE 145 MG PO TABS: 145.0000 mg | ORAL_TABLET | Freq: Every day | ORAL | Status: DC

## 2014-10-08 ENCOUNTER — Ambulatory Visit: Payer: Medicare Other | Admitting: Internal Medicine

## 2014-10-10 ENCOUNTER — Encounter: Payer: Self-pay | Admitting: Internal Medicine

## 2014-10-10 ENCOUNTER — Other Ambulatory Visit: Payer: Medicare Other

## 2014-10-10 ENCOUNTER — Ambulatory Visit: Payer: Medicare Other

## 2014-10-10 ENCOUNTER — Ambulatory Visit (INDEPENDENT_AMBULATORY_CARE_PROVIDER_SITE_OTHER): Payer: Medicare Other | Admitting: Internal Medicine

## 2014-10-10 VITALS — BP 128/78 | HR 62 | Temp 98.5°F | Ht 72.0 in | Wt 188.0 lb

## 2014-10-10 DIAGNOSIS — Z23 Encounter for immunization: Secondary | ICD-10-CM

## 2014-10-10 DIAGNOSIS — Z1159 Encounter for screening for other viral diseases: Secondary | ICD-10-CM | POA: Diagnosis not present

## 2014-10-10 LAB — HEPATITIS C ANTIBODY: HCV AB: NEGATIVE

## 2014-10-10 NOTE — Progress Notes (Signed)
   Subjective:    Patient ID: Tom Gaines, male    DOB: 1945-04-04, 69 y.o.   MRN: [id number]  HPI    Review of Systems     Objective:   Physical Exam        Assessment & Plan:   He made the appointment simply for immunizations and hepatitis C screening. He had no active complaints necessitating an office visit.

## 2014-10-10 NOTE — Progress Notes (Signed)
Pre visit review using our clinic review tool, if applicable. No additional management support is needed unless otherwise documented below in the visit note. 

## 2014-11-14 DIAGNOSIS — C61 Malignant neoplasm of prostate: Secondary | ICD-10-CM | POA: Diagnosis not present

## 2014-12-01 DIAGNOSIS — C61 Malignant neoplasm of prostate: Secondary | ICD-10-CM | POA: Insufficient documentation

## 2014-12-07 ENCOUNTER — Encounter: Payer: Self-pay | Admitting: Internal Medicine

## 2014-12-31 ENCOUNTER — Other Ambulatory Visit: Payer: Self-pay | Admitting: Internal Medicine

## 2015-02-01 HISTORY — PX: CATARACT EXTRACTION, BILATERAL: SHX1313

## 2015-02-18 ENCOUNTER — Other Ambulatory Visit: Payer: Self-pay | Admitting: Internal Medicine

## 2015-02-20 ENCOUNTER — Encounter: Payer: Self-pay | Admitting: Internal Medicine

## 2015-02-20 DIAGNOSIS — Z8739 Personal history of other diseases of the musculoskeletal system and connective tissue: Secondary | ICD-10-CM

## 2015-02-20 DIAGNOSIS — Z0000 Encounter for general adult medical examination without abnormal findings: Secondary | ICD-10-CM

## 2015-02-20 DIAGNOSIS — E785 Hyperlipidemia, unspecified: Secondary | ICD-10-CM

## 2015-02-20 DIAGNOSIS — Z139 Encounter for screening, unspecified: Secondary | ICD-10-CM

## 2015-02-22 MED ORDER — FENOFIBRATE 145 MG PO TABS: 145.0000 mg | ORAL_TABLET | Freq: Every day | ORAL | Status: DC

## 2015-03-03 DIAGNOSIS — L570 Actinic keratosis: Secondary | ICD-10-CM | POA: Diagnosis not present

## 2015-03-03 DIAGNOSIS — L821 Other seborrheic keratosis: Secondary | ICD-10-CM | POA: Diagnosis not present

## 2015-03-03 DIAGNOSIS — B0089 Other herpesviral infection: Secondary | ICD-10-CM | POA: Diagnosis not present

## 2015-03-03 DIAGNOSIS — L814 Other melanin hyperpigmentation: Secondary | ICD-10-CM | POA: Diagnosis not present

## 2015-03-03 DIAGNOSIS — D2362 Other benign neoplasm of skin of left upper limb, including shoulder: Secondary | ICD-10-CM | POA: Diagnosis not present

## 2015-03-03 DIAGNOSIS — C61 Malignant neoplasm of prostate: Secondary | ICD-10-CM | POA: Diagnosis not present

## 2015-03-12 ENCOUNTER — Other Ambulatory Visit: Payer: Self-pay | Admitting: Internal Medicine

## 2015-03-13 ENCOUNTER — Other Ambulatory Visit: Payer: Self-pay | Admitting: Emergency Medicine

## 2015-03-13 MED ORDER — FENOFIBRATE 145 MG PO TABS: 145.0000 mg | ORAL_TABLET | Freq: Every day | ORAL | Status: DC

## 2015-03-17 ENCOUNTER — Other Ambulatory Visit: Payer: Self-pay | Admitting: General Practice

## 2015-03-17 MED ORDER — FENOFIBRATE 145 MG PO TABS
145.0000 mg | ORAL_TABLET | Freq: Every day | ORAL | Status: DC
Start: 2015-03-17 — End: 2015-05-12

## 2015-03-21 ENCOUNTER — Other Ambulatory Visit: Payer: Self-pay | Admitting: Emergency Medicine

## 2015-03-21 MED ORDER — FLUTICASONE PROPIONATE 50 MCG/ACT NA SUSP: NASAL | Status: DC

## 2015-03-25 ENCOUNTER — Other Ambulatory Visit (INDEPENDENT_AMBULATORY_CARE_PROVIDER_SITE_OTHER): Payer: Medicare Other

## 2015-03-25 DIAGNOSIS — Z139 Encounter for screening, unspecified: Secondary | ICD-10-CM

## 2015-03-25 DIAGNOSIS — Z8639 Personal history of other endocrine, nutritional and metabolic disease: Secondary | ICD-10-CM | POA: Diagnosis not present

## 2015-03-25 DIAGNOSIS — Z0000 Encounter for general adult medical examination without abnormal findings: Secondary | ICD-10-CM

## 2015-03-25 DIAGNOSIS — E785 Hyperlipidemia, unspecified: Secondary | ICD-10-CM | POA: Diagnosis not present

## 2015-03-25 DIAGNOSIS — Z8739 Personal history of other diseases of the musculoskeletal system and connective tissue: Secondary | ICD-10-CM

## 2015-03-25 LAB — URIC ACID: URIC ACID, SERUM: 5.1 mg/dL (ref 4.0–7.8)

## 2015-03-25 LAB — CBC WITH DIFFERENTIAL/PLATELET
BASOS PCT: 0.5 % (ref 0.0–3.0)
Basophils Absolute: 0 10*3/uL (ref 0.0–0.1)
EOS PCT: 2.8 % (ref 0.0–5.0)
Eosinophils Absolute: 0.2 10*3/uL (ref 0.0–0.7)
HCT: 43.1 % (ref 39.0–52.0)
Hemoglobin: 14.3 g/dL (ref 13.0–17.0)
LYMPHS ABS: 1.6 10*3/uL (ref 0.7–4.0)
Lymphocytes Relative: 27.9 % (ref 12.0–46.0)
MCHC: 33.3 g/dL (ref 30.0–36.0)
MCV: 92.9 fl (ref 78.0–100.0)
MONO ABS: 0.6 10*3/uL (ref 0.1–1.0)
Monocytes Relative: 9.6 % (ref 3.0–12.0)
NEUTROS ABS: 3.4 10*3/uL (ref 1.4–7.7)
NEUTROS PCT: 59.2 % (ref 43.0–77.0)
PLATELETS: 234 10*3/uL (ref 150.0–400.0)
RBC: 4.64 Mil/uL (ref 4.22–5.81)
RDW: 15.9 % — AB (ref 11.5–15.5)
WBC: 5.7 10*3/uL (ref 4.0–10.5)

## 2015-03-25 LAB — COMPREHENSIVE METABOLIC PANEL
ALBUMIN: 4.6 g/dL (ref 3.5–5.2)
ALT: 19 U/L (ref 0–53)
AST: 27 U/L (ref 0–37)
Alkaline Phosphatase: 38 U/L — ABNORMAL LOW (ref 39–117)
BUN: 29 mg/dL — AB (ref 6–23)
CHLORIDE: 108 meq/L (ref 96–112)
CO2: 29 mEq/L (ref 19–32)
CREATININE: 1.62 mg/dL — AB (ref 0.40–1.50)
Calcium: 9.6 mg/dL (ref 8.4–10.5)
GFR: 45.01 mL/min — ABNORMAL LOW (ref >60.00)
GLUCOSE: 87 mg/dL (ref 70–99)
Potassium: 4.7 mEq/L (ref 3.5–5.1)
SODIUM: 142 meq/L (ref 135–145)
TOTAL PROTEIN: 7 g/dL (ref 6.0–8.3)
Total Bilirubin: 0.6 mg/dL (ref 0.2–1.2)

## 2015-03-25 LAB — LIPID PANEL
CHOLESTEROL: 170 mg/dL (ref 0–200)
HDL: 60 mg/dL (ref >39.00)
LDL Cholesterol: 95 mg/dL (ref 0–99)
NonHDL: 110.14
Total CHOL/HDL Ratio: 3
Triglycerides: 76 mg/dL (ref 0.0–149.0)
VLDL: 15.2 mg/dL (ref 0.0–40.0)

## 2015-03-25 LAB — TSH: TSH: 1.2 u[IU]/mL (ref 0.35–4.50)

## 2015-03-25 LAB — HEMOGLOBIN A1C: HEMOGLOBIN A1C: 5.7 % (ref 4.6–6.5)

## 2015-03-30 ENCOUNTER — Other Ambulatory Visit (INDEPENDENT_AMBULATORY_CARE_PROVIDER_SITE_OTHER): Payer: Medicare Other

## 2015-03-30 ENCOUNTER — Encounter: Payer: Self-pay | Admitting: Internal Medicine

## 2015-03-30 ENCOUNTER — Ambulatory Visit (INDEPENDENT_AMBULATORY_CARE_PROVIDER_SITE_OTHER): Payer: Medicare Other | Admitting: Internal Medicine

## 2015-03-30 VITALS — BP 110/76 | HR 64 | Temp 97.8°F | Resp 16 | Wt 189.0 lb

## 2015-03-30 DIAGNOSIS — R7989 Other specified abnormal findings of blood chemistry: Secondary | ICD-10-CM

## 2015-03-30 DIAGNOSIS — E785 Hyperlipidemia, unspecified: Secondary | ICD-10-CM

## 2015-03-30 DIAGNOSIS — Z0000 Encounter for general adult medical examination without abnormal findings: Secondary | ICD-10-CM | POA: Diagnosis not present

## 2015-03-30 DIAGNOSIS — R748 Abnormal levels of other serum enzymes: Secondary | ICD-10-CM | POA: Diagnosis not present

## 2015-03-30 DIAGNOSIS — Z8639 Personal history of other endocrine, nutritional and metabolic disease: Secondary | ICD-10-CM

## 2015-03-30 DIAGNOSIS — Z8739 Personal history of other diseases of the musculoskeletal system and connective tissue: Secondary | ICD-10-CM

## 2015-03-30 LAB — BASIC METABOLIC PANEL
BUN: 25 mg/dL — ABNORMAL HIGH (ref 6–23)
CO2: 31 meq/L (ref 19–32)
Calcium: 10.2 mg/dL (ref 8.4–10.5)
Chloride: 104 mEq/L (ref 96–112)
Creatinine, Ser: 1.28 mg/dL (ref 0.40–1.50)
GFR: 59.07 mL/min — ABNORMAL LOW (ref >60.00)
GLUCOSE: 95 mg/dL (ref 70–99)
Potassium: 4.8 mEq/L (ref 3.5–5.1)
SODIUM: 140 meq/L (ref 135–145)

## 2015-03-30 NOTE — Progress Notes (Signed)
Subjective:    Patient ID: Tom Gaines, male    DOB: 09/06/1945, 70 y.o.   MRN: [id number]  HPI He is here to establish with a new pcp.    Hyperlipidemia: He is taking his medication daily. He is compliant with a low fat/cholesterol diet. He is exercising regularly. He denies myalgias.   History of gout:  His is taking the allopurinol daily as prescribed.  On occasion he will experience a slight tingle in his right big toe and he feels that may be the start of gout, but this resolves quickly without any treatment. He wonders if he should take an additional allopurinol.  He takes aleve a couple of times a week.    Here for medicare wellness.   I have personally reviewed and have noted 1.The patient's medical and social history 2.Their use of alcohol, tobacco or illicit drugs 3.Their current medications and supplements 4.The patient's functional ability including ADL's, fall risks, home safety risks and                 hearing or visual impairment. 5.Diet and physical activities 6.Evidence for depression or mood disorders    Are there smokers in your home (other than you)? No  Risk Factors Exercise: gym 5 days a week Dietary issues discussed: healthy, well balanced  Cardiac risk factors: advanced age, hyperlipidemia   Depression Screen  Have you felt down, depressed or hopeless? No  Have you felt little interest or pleasure in doing things?  No  Activities of Daily Living In your present state of health, do you have any difficulty performing the following activities?:  Driving? Yes Managing money?  Yes Feeding yourself? Yes Getting from bed to chair? Yes Climbing a flight of stairs? Yes Preparing food and eating?: Yes Bathing or showering? Yes Getting dressed: Yes Getting to/using the toilet? Yes Moving around from place to place: Yes In the past year have you fallen or had a near fall?: no    Are you  sexually active?  No  Do you have more than one partner?  N/A  Hearing Difficulties: No Do you often ask people to speak up or repeat themselves? No Do you experience ringing or noises in your ears? No Do you have difficulty understanding soft or whispered voices? No Vision:              Any change in vision: no              Up to date with eye exam: yes Memory:  Do you feel that you have a problem with memory? No  Do you often misplace items? No  Do you feel safe at home?  Yes  Cognitive Testing  Alert, Orientated? Yes  Normal Appearance? Yes  Recall of three objects?  Yes  Can perform simple calculations? Yes  Displays appropriate judgment? Yes  Can read the correct time from a watch face? Yes   Advanced Directives have been discussed with the patient? Yes, in place  Medications and allergies reviewed with patient and updated if appropriate.  Patient Active Problem List   Diagnosis Date Noted  . Low back pain 03/13/2014  . Basal cell carcinoma 02/13/2013  . Hyperlipidemia 03/12/2008  . History of gout 03/12/2008  . PROSTATE CANCER, HX OF 03/12/2008  . History of colonic polyps 03/12/2008    Current Outpatient Prescriptions on File Prior to Visit  Medication Sig Dispense Refill  . acetaminophen (TYLENOL) 325 MG tablet Take 650 mg  by mouth every 6 (six) hours as needed.    Marland Kitchen acyclovir (ZOVIRAX) 800 MG tablet Take 400 mg by mouth 2 (two) times daily.     Marland Kitchen allopurinol (ZYLOPRIM) 300 MG tablet Take 1 tablet daily 90 tablet 0  . aspirin 81 MG tablet Take 81 mg by mouth daily.      . cetirizine (ZYRTEC) 10 MG tablet Take 10 mg by mouth as needed.     . fenofibrate (TRICOR) 145 MG tablet Take 1 tablet (145 mg total) by mouth daily. 30 tablet 0  . fluticasone (FLONASE) 50 MCG/ACT nasal spray USE 1 SPRAY NASALLY TWICE A DAY AS NEEDED 16 g 0  . Multiple Vitamin (MULTIVITAMIN) tablet Take 1 tablet by mouth daily.      . naproxen sodium (ALEVE) 220 MG tablet Take 220 mg by mouth  as needed.      . Omega-3 Fatty Acids (FISH OIL) 1000 MG CAPS Take 1,000 mg by mouth. 1 by mouth daily    . Turmeric Curcumin 500 MG CAPS Take 500 mg by mouth 2 (two) times daily.     No current facility-administered medications on file prior to visit.    Past Medical History  Diagnosis Date  . Gout   . Prostate cancer 1998    Past Surgical History  Procedure Laterality Date  . Prostatectomy  1998  . Lumbar disc surgery  2002    Dr Ellene Route  . Tonsillectomy and adenoidectomy    . Shoulder arthroscopy      right; 2011; left 1990  . Colonoscopy with polypectomy  2014    Dr Fuller Plan    Social History   Social History  . Marital Status: Married    Spouse Name: N/A  . Number of Children: N/A  . Years of Education: N/A   Social History Main Topics  . Smoking status: Former Smoker    Quit date: 01/31/1985  . Smokeless tobacco: Never Used     Comment: smoked  [place], up to 2.5 ppd  . Alcohol Use: No     Comment:  none since 2012  . Drug Use: No  . Sexual Activity: Not on file   Other Topics Concern  . Not on file   Social History Narrative    Family History  Problem Relation Age of Onset  . Diabetes Mother   . Diabetes Sister   . Lung cancer      maternal uncle & aunt  . Colon cancer Neg Hx   . Stomach cancer Neg Hx   . Arthritis Neg Hx   . Heart disease Neg Hx   . Stroke Neg Hx     Review of Systems  Constitutional: Negative for fever and chills.  HENT: Negative for hearing loss.   Eyes: Negative for visual disturbance.  Respiratory: Negative for cough, shortness of breath and wheezing.   Cardiovascular: Negative for chest pain, palpitations and leg swelling.  Gastrointestinal: Negative for nausea, abdominal pain, diarrhea, constipation and blood in stool.       No GERD  Genitourinary: Negative for dysuria, hematuria and difficulty urinating.  Musculoskeletal: Positive for back pain (arthritis) and neck pain (arthritis).  Skin: Negative for rash.    Neurological: Negative for dizziness, weakness, light-headedness, numbness and headaches.  Psychiatric/Behavioral: Negative for sleep disturbance and dysphoric mood. The patient is not nervous/anxious.        Objective:   Filed Vitals:   03/30/15 1039  BP: 110/76  Pulse: 64  Temp:  97.8 F (36.6 C)  Resp: 16   Filed Weights   03/30/15 1039  Weight: 189 lb (85.73 kg)   Body mass index is 25.63 kg/(m^2).   Physical Exam Constitutional: He appears well-developed and well-nourished. No distress.  HENT:  Head: Normocephalic and atraumatic.  Right Ear: External ear normal.  Left Ear: External ear normal.  Mouth/Throat: Oropharynx is clear and moist.  Normal ear canals and TM b/l  Eyes: Conjunctivae and EOM are normal.  Neck: Neck supple. No tracheal deviation present. No thyromegaly present.  No carotid bruit  Cardiovascular: Normal rate, regular rhythm, normal heart sounds and intact distal pulses.   No murmur heard. Pulmonary/Chest: Effort normal and breath sounds normal. No respiratory distress. He has no wheezes. He has no rales.  Abdominal: Soft. Bowel sounds are normal. He exhibits no distension. There is no tenderness.  Genitourinary:  deferred  to urology  Musculoskeletal: He exhibits no edema.  Lymphadenopathy:    He has no cervical adenopathy.  Skin: Skin is warm and dry. He is not diaphoretic.  Psychiatric: He has a normal mood and affect. His behavior is normal.      Assessment & Plan:   Wellness Exam Immunizations administered today Colonoscopy up-to-date  Eye exam - follows with his ophthalmologist regularly  Hearing loss-declines hearing loss  Memory concerns/difficulties, no memory difficulties or concerns  Independent of ADL's-completely independent Exercising regularly at least 5 days a week Healthy overall balanced diet Has a history of basal cell carcinoma of the skin and follows with dermatology once a year Has a history of prostate cancer and  follows with urology once a year Advanced directives in place No depression or anxiety concerns  We reviewed his recent blood work. Everything was normal except for an increase in creatinine/decreasing kidney function. We will repeat this today. He takes Aleve approximately twice a week-not in excess. He doesn't drink many fluids at night, but does drink fluids during the day.  See Problem List for Assessment and Plan of chronic medical problems.  Follow-up annually

## 2015-03-30 NOTE — Assessment & Plan Note (Signed)
No episodes of gout since his last visit Uric acid well-controlled Continue current dose of allopurinol

## 2015-03-30 NOTE — Assessment & Plan Note (Signed)
Lipid panel well controlled Continue regular exercise and healthy diet Continue current dose of fenofibrate and other supplements

## 2015-03-30 NOTE — Progress Notes (Signed)
Pre visit review using our clinic review tool, if applicable. No additional management support is needed unless otherwise documented below in the visit note. 

## 2015-03-30 NOTE — Patient Instructions (Signed)
  [name] , Thank you for taking time to come for your Medicare Wellness Visit. I appreciate your ongoing commitment to your health goals. Please review the following plan we discussed and let me know if I can assist you in the future.   These are the goals we discussed: Goals    None      This is a list of the screening recommended for you and due dates:  Health Maintenance  Topic Date Due  . Flu Shot  09/01/2015  . Colon Cancer Screening  02/05/2017  . Tetanus Vaccine  03/12/2018  . Shingles Vaccine  Completed  .  Hepatitis C: One time screening is recommended by Center for Disease Control  (CDC) for  adults born from [birth year] through 1965.   Completed  . Pneumonia vaccines  Completed    We have reviewed your prior records including labs and tests today.  Test(s) ordered today. Your results will be released to [place] (or called to you) after review, usually within 72hours after test completion. If any changes need to be made, you will be notified at that same time.  All other Health Maintenance issues reviewed.   All recommended immunizations and age-appropriate screenings are up-to-date.  No immunizations administered today.   Medications reviewed and updated.  No changes recommended at this time.   Please followup annually

## 2015-04-09 DIAGNOSIS — H1131 Conjunctival hemorrhage, right eye: Secondary | ICD-10-CM | POA: Diagnosis not present

## 2015-04-09 DIAGNOSIS — H25813 Combined forms of age-related cataract, bilateral: Secondary | ICD-10-CM | POA: Diagnosis not present

## 2015-04-09 DIAGNOSIS — H11132 Conjunctival pigmentations, left eye: Secondary | ICD-10-CM | POA: Diagnosis not present

## 2015-04-27 DIAGNOSIS — H2511 Age-related nuclear cataract, right eye: Secondary | ICD-10-CM | POA: Diagnosis not present

## 2015-04-27 DIAGNOSIS — H25811 Combined forms of age-related cataract, right eye: Secondary | ICD-10-CM | POA: Diagnosis not present

## 2015-05-08 DIAGNOSIS — H2512 Age-related nuclear cataract, left eye: Secondary | ICD-10-CM | POA: Diagnosis not present

## 2015-05-12 ENCOUNTER — Other Ambulatory Visit: Payer: Self-pay | Admitting: Emergency Medicine

## 2015-05-12 MED ORDER — FENOFIBRATE 145 MG PO TABS: 145.0000 mg | ORAL_TABLET | Freq: Every day | ORAL | Status: DC

## 2015-05-18 DIAGNOSIS — H2512 Age-related nuclear cataract, left eye: Secondary | ICD-10-CM | POA: Diagnosis not present

## 2015-05-18 DIAGNOSIS — H25812 Combined forms of age-related cataract, left eye: Secondary | ICD-10-CM | POA: Diagnosis not present

## 2015-05-25 DIAGNOSIS — C61 Malignant neoplasm of prostate: Secondary | ICD-10-CM | POA: Diagnosis not present

## 2015-06-01 DIAGNOSIS — C61 Malignant neoplasm of prostate: Secondary | ICD-10-CM | POA: Diagnosis not present

## 2015-06-03 ENCOUNTER — Other Ambulatory Visit: Payer: Self-pay | Admitting: Internal Medicine

## 2015-06-18 DIAGNOSIS — H5203 Hypermetropia, bilateral: Secondary | ICD-10-CM | POA: Diagnosis not present

## 2015-06-18 DIAGNOSIS — H524 Presbyopia: Secondary | ICD-10-CM | POA: Diagnosis not present

## 2015-06-26 ENCOUNTER — Other Ambulatory Visit: Payer: Self-pay | Admitting: Internal Medicine

## 2015-06-30 ENCOUNTER — Encounter: Payer: Self-pay | Admitting: Internal Medicine

## 2015-06-30 DIAGNOSIS — M545 Low back pain: Secondary | ICD-10-CM

## 2015-07-01 ENCOUNTER — Other Ambulatory Visit: Payer: Self-pay | Admitting: *Deleted

## 2015-07-01 MED ORDER — ALLOPURINOL 300 MG PO TABS: 300.0000 mg | ORAL_TABLET | Freq: Every day | ORAL | Status: DC

## 2015-07-14 ENCOUNTER — Encounter: Payer: Self-pay | Admitting: Internal Medicine

## 2015-07-28 ENCOUNTER — Encounter: Payer: Self-pay | Admitting: Physical Therapy

## 2015-07-28 ENCOUNTER — Ambulatory Visit: Payer: Medicare Other | Attending: Internal Medicine | Admitting: Physical Therapy

## 2015-07-28 DIAGNOSIS — M545 Low back pain, unspecified: Secondary | ICD-10-CM

## 2015-07-28 DIAGNOSIS — M6281 Muscle weakness (generalized): Secondary | ICD-10-CM | POA: Insufficient documentation

## 2015-07-29 NOTE — Therapy (Addendum)
[place], [place], [address] Phone: [phone]   Fax:  [phone]  Physical Therapy Evaluation  Patient Details  Name: Tom Gaines MRN: [id number] Date of Birth: 03/05/1945 Referring Provider: Dr Billey Gosling  Encounter Date: 07/28/2015      PT End of Session - 07/29/15 0952    Visit Number 1   Number of Visits 12   Date for PT Re-Evaluation 09/09/15   Authorization Type Medicare    PT Start Time [id number]   PT Stop Time 1503   PT Time Calculation (min) 48 min   Activity Tolerance Patient tolerated treatment well   Behavior During Therapy [facility] for tasks assessed/performed      Past Medical History  Diagnosis Date  . Gout   . Prostate cancer ([place]) 1998    Past Surgical History  Procedure Laterality Date  . Prostatectomy  1998  . Lumbar disc surgery  2002    Dr Ellene Route  . Tonsillectomy and adenoidectomy    . Shoulder arthroscopy      right; 2011; left 1990  . Colonoscopy with polypectomy  2014    Dr Fuller Plan    There were no vitals filed for this visit.       Subjective Assessment - 07/29/15 0930    Subjective Patient has a long history of central back pain. he has had PT in the past which has helped. His pain is not as bad as it has been in the past but it is there when he stands for too long. He would like to find out if there is anything more he can do for his  back.    Patient is accompained by: Family member   How long can you sit comfortably? Prolonged sitting in a chair without a back    How long can you stand comfortably? > 1/2 hour    How long can you walk comfortably? N?A    Diagnostic tests Nothing since 2002    Patient Stated Goals Less pain when standing;    Currently in Pain? Yes   Pain Score 4    Pain Location Back   Pain Orientation Mid   Pain Descriptors / Indicators Aching   Pain Type Chronic pain   Pain Onset More than a month ago   Pain Frequency Intermittent   Aggravating Factors  standing    Pain Relieving Factors rest    Multiple Pain Sites No            [facility] PT Assessment - 07/29/15 0001    Assessment   Medical Diagnosis Low back pain    Referring Provider Dr Billey Gosling   Onset Date/Surgical Date --  > 5 years   Hand Dominance Right   Next MD Visit Nothing schedueled   Prior Therapy Yes improvement noted after last episode. Still performing stretches and exercises.    Precautions   Precautions None   Restrictions   Weight Bearing Restrictions No   Balance Screen   Has the patient fallen in the past 6 months No   Home Environment   Additional Comments Lives at home . Nothing significant in his house.    Prior Function   Level of Independence Independent   Cognition   Overall Cognitive Status Within Functional Limits for tasks assessed   Observation/Other Assessments   Focus on Therapeutic Outcomes (FOTO)  Not given     Sensation   Light Touch Appears Intact   Coordination  Gross Motor Movements are Fluid and Coordinated Yes   Fine Motor Movements are Fluid and Coordinated Yes   Posture/Postural Control   Posture Comments Flat lordosis, left shoulder elvation    ROM / Strength   AROM / PROM / Strength AROM;PROM;Strength   AROM   Overall AROM Comments Spinal flexion limited 25 % all other movements WNL    Strength   Overall Strength Comments all strength measurements 5/5 good; fair core contraction noted with abdominal isometric;    Palpation   Spinal mobility L4 L5 limited PA movement    SI assessment  No pain with sacral rocking; limited sacral movement    Palpation comment No signifcant spasming noted    Special Tests    Special Tests --  SLR (-) Leg length (=)    Ambulation/Gait   Gait Comments No deviations noted                    [facility] Adult PT Treatment/Exercise - 07/29/15 0001    Lumbar Exercises: Stretches   Passive Hamstring Stretch Limitations 2x20sec w/ strap    Single Knee to Chest  Stretch Limitations 2x20sec    Lower Trunk Rotation Limitations x10    Piriformis Stretch Limitations 2x20sec    Lumbar Exercises: Supine   Bridge Limitations x10    Straight Leg Raises Limitations x10    Other Supine Lumbar Exercises Thomas stretch 2x20sec    Lumbar Exercises: Quadruped   Other Quadruped Lumbar Exercises Prayer stretch2x20sec, lateral prayer stretch 2x20sec   Other Quadruped Lumbar Exercises Quadruped alt ue/le                 PT Education - 07/29/15 [phone]    Education provided Yes   Education Details Education provised on the importance of stretching and core stregthening.    Person(s) Educated Patient   Methods Explanation;Demonstration   Comprehension Verbalized understanding;Returned demonstration          PT Short Term Goals - 07/29/15 1253    PT SHORT TERM GOAL #1   Title independent with HEP, inital   Time 3   Period Weeks   Status New   PT SHORT TERM GOAL #2   Title Patient will report 2/10 pain at worst with standing    Time 3   Period Weeks   Status New   PT SHORT TERM GOAL #3   Title Patient will demsotrate full active trunk flexion    Time 3   Period Weeks   Status New           PT Long Term Goals - 07/29/15 1255    PT LONG TERM GOAL #1   Title Patient will demsotrate good core strength in order to stand without pain    Time 6   Period Weeks   Status New   PT LONG TERM GOAL #2   Title Patient will report 75% improvement in standing pain    Time 6   Period Weeks   Status New   PT LONG TERM GOAL #3   Title Patient will perfrom core exercises on the gym equipment without cuing for setup    Time 6   Period Weeks   Status New               Plan - 07/29/15 0959    Clinical Impression Statement Patient is a 70 year old male with lower back pain. He presents with hamstring tightness and minor core weakness. He has pain when  he is standing. The patient has exercises that are helpful. He will be seen 1-2x a week to  update his HEP . He was seen today for a low complexity evaluation.    Rehab Potential Good   PT Frequency 2x / week   PT Duration 8 weeks   PT Treatment/Interventions ADLs/Self Care Home Management;Electrical Stimulation;Cryotherapy;Iontophoresis 4mg /ml Dexamethasone;Moist Heat;Therapeutic activities;Functional mobility training;Therapeutic exercise;Gait training;Stair training;Patient/family education;Neuromuscular re-education;Dry needling;Vasopneumatic Device   PT Next Visit Plan give patinet high level gym stregthening and furter pilates exercises.    PT Home Exercise Plan piriformis stretch LTR, single knee to chest, hamstring stretch, prayer stretch, sinke stretch, lateral prayer stretch,    Consulted and Agree with Plan of Care Patient      Patient will benefit from skilled therapeutic intervention in order to improve the following deficits and impairments:  Pain, Decreased strength  Visit Diagnosis: Bilateral low back pain without sciatica - Plan: PT plan of care cert/re-cert  Muscle weakness (generalized) - Plan: PT plan of care cert/re-cert     Problem List Patient Active Problem List   Diagnosis Date Noted  . Low back pain 03/13/2014  . Basal cell carcinoma 02/13/2013  . Hyperlipidemia 03/12/2008  . History of gout 03/12/2008  . PROSTATE CANCER, HX OF 03/12/2008  . History of colonic polyps 03/12/2008    Carney Living PT DPT  07/29/2015, 1:01 PM  [facility] [address] [place], [place], [address] Phone: [phone]   Fax:  [phone]  Name: JOURNEE LICO MRN: [id number] Date of Birth: 1945/04/21   Patient did not return after initial visit. D/C to HEP.

## 2015-08-13 ENCOUNTER — Encounter: Payer: Medicare Other | Admitting: Physical Therapy

## 2015-08-18 ENCOUNTER — Encounter: Payer: Medicare Other | Admitting: Physical Therapy

## 2015-11-20 ENCOUNTER — Ambulatory Visit (INDEPENDENT_AMBULATORY_CARE_PROVIDER_SITE_OTHER)
Admission: RE | Admit: 2015-11-20 | Discharge: 2015-11-20 | Disposition: A | Discharge status: Home / Self Care | Payer: Medicare Other | Source: Ambulatory Visit | Attending: Internal Medicine | Admitting: Internal Medicine

## 2015-11-20 ENCOUNTER — Encounter: Payer: Self-pay | Admitting: Internal Medicine

## 2015-11-20 ENCOUNTER — Ambulatory Visit (INDEPENDENT_AMBULATORY_CARE_PROVIDER_SITE_OTHER): Payer: Medicare Other | Admitting: Internal Medicine

## 2015-11-20 VITALS — BP 120/78 | HR 81 | Temp 98.0°F | Resp 16 | Ht 72.0 in | Wt 184.0 lb

## 2015-11-20 DIAGNOSIS — G8929 Other chronic pain: Secondary | ICD-10-CM

## 2015-11-20 DIAGNOSIS — M545 Low back pain, unspecified: Secondary | ICD-10-CM

## 2015-11-20 DIAGNOSIS — R0789 Other chest pain: Secondary | ICD-10-CM | POA: Insufficient documentation

## 2015-11-20 DIAGNOSIS — Z23 Encounter for immunization: Secondary | ICD-10-CM | POA: Diagnosis not present

## 2015-11-20 NOTE — Assessment & Plan Note (Signed)
Intermittent, chronic without associated concerning symptoms Has had a stress test in the past and it was normal - not cardiac in nature Likely musculoskeletal in nature reassured him

## 2015-11-20 NOTE — Patient Instructions (Signed)
An Xray was ordered.   Test(s) ordered today. Your results will be released to [place] (or called to you) after review, usually within 72hours after test completion. If any changes need to be made, you will be notified at that same time.   Flu vaccine administered today.   Medications reviewed and updated.  No changes recommended at this time.   A referral was ordered for sports medicine for your back pain.

## 2015-11-20 NOTE — Assessment & Plan Note (Addendum)
Chronic Likely arthritis, no evidence of radiculopathy.  He does have a history of prostate cancer X-ray today Will refer to Dr Tamala Julian for evaluation He is very active and will continue his current activities

## 2015-11-20 NOTE — Progress Notes (Signed)
Pre visit review using our clinic review tool, if applicable. No additional management support is needed unless otherwise documented below in the visit note. 

## 2015-11-20 NOTE — Progress Notes (Signed)
Subjective:    Patient ID: Tom Gaines, male    DOB: 1945-06-10, 70 y.o.   MRN: [id number]  HPI He is here for an acute visit.   Back pain:  He has had back surgery in 2002 for a herniated disc.  After that he was pain free.  Over the past couple of years he has had chronic lower back pain.  The pain is across his lower back.  It is worse in the morning and he is very stiff in the morning.The pain is worse at night as well. It also tends to be worse when he is standing for shortness of better when he sits. He did do physical therapy once at [facility]  - he went for one visit and was given exercises to do that would take him almost 2 hours to complete.  He did not go back.  He is exercising regularly - he does yoga, pilates, stair master.  He wonders if this is arthritis and if this is something he needs to live with or if there is something he can do to improve it.  He does not want to take medication.  He does get occasional discomfort in his right leg. He denies any true pain.  It reminds him of the pain he used to get from the herniated disc.  Sensation in chest: For several years he has had a sensation on the left side of his chest. He denies true pain. It is very transient. He did tell Dr. Linna Darner about this in the past and a stress test, which was normal. He denies any cough, wheeze, palpitations or lightheadedness.  It only occurs on occasion and he is not overly concerned about it.  He never has it on the right side.   Medications and allergies reviewed with patient and updated if appropriate.  Patient Active Problem List   Diagnosis Date Noted  . Low back pain 03/13/2014  . Basal cell carcinoma 02/13/2013  . Hyperlipidemia 03/12/2008  . History of gout 03/12/2008  . PROSTATE CANCER, HX OF 03/12/2008  . History of colonic polyps 03/12/2008    Current Outpatient Prescriptions on File Prior to Visit  Medication Sig Dispense Refill  . acyclovir (ZOVIRAX) 800 MG tablet Take 400 mg by  mouth 2 (two) times daily.     Marland Kitchen allopurinol (ZYLOPRIM) 300 MG tablet Take 1 tablet (300 mg total) by mouth daily. 90 tablet 3  . Ascorbic Acid (VITAMIN C) 100 MG tablet Take 100 mg by mouth daily.    Marland Kitchen aspirin 81 MG tablet Take 81 mg by mouth daily.      . cetirizine (ZYRTEC) 10 MG tablet Take 10 mg by mouth as needed.     Marland Kitchen CINNAMON PO Take 500 mg by mouth daily.    . fenofibrate (TRICOR) 145 MG tablet Take 1 tablet (145 mg total) by mouth daily. 90 tablet 3  . fluticasone (FLONASE) 50 MCG/ACT nasal spray USE 1 SPRAY NASALLY TWICE A DAY AS NEEDED 48 g 1  . IRON PO Take 28 mg by mouth daily. Reported on 07/28/2015    . Multiple Vitamin (MULTIVITAMIN) tablet Take 1 tablet by mouth daily.      . naproxen sodium (ALEVE) 220 MG tablet Take 220 mg by mouth as needed.      . Omega-3 Fatty Acids (FISH OIL) 1000 MG CAPS Take 1,000 mg by mouth. 1 by mouth daily    . Turmeric Curcumin 500 MG CAPS Take 500  mg by mouth 2 (two) times daily.     No current facility-administered medications on file prior to visit.     Past Medical History:  Diagnosis Date  . Gout   . Prostate cancer ([place]) 1998    Past Surgical History:  Procedure Laterality Date  . colonoscopy with polypectomy  2014   Dr Fuller Plan  . LUMBAR DISC SURGERY  2002   Dr Ellene Route  . PROSTATECTOMY  1998  . SHOULDER ARTHROSCOPY     right; 2011; left 1990  . TONSILLECTOMY AND ADENOIDECTOMY      Social History   Social History  . Marital status: Married    Spouse name: N/A  . Number of children: N/A  . Years of education: N/A   Social History Main Topics  . Smoking status: Former Smoker    Quit date: 01/31/1985  . Smokeless tobacco: Never Used     Comment: smoked  [place], up to 2.5 ppd  . Alcohol use No     Comment:  none since 2012  . Drug use: No  . Sexual activity: Not on file   Other Topics Concern  . Not on file   Social History Narrative   Exercises regularly - gym 5 days a week    Family History  Problem  Relation Age of Onset  . Diabetes Mother   . Diabetes Sister   . Lung cancer      maternal uncle & aunt  . Colon cancer Neg Hx   . Stomach cancer Neg Hx   . Arthritis Neg Hx   . Heart disease Neg Hx   . Stroke Neg Hx     Review of Systems  Respiratory: Negative for cough, shortness of breath and wheezing.   Cardiovascular: Negative for chest pain and palpitations.  Musculoskeletal: Positive for back pain. Negative for gait problem.  Neurological: Negative for weakness and numbness.       Objective:   Vitals:   11/20/15 1431  BP: 120/78  Pulse: 81  Resp: 16  Temp: 98 F (36.7 C)   Filed Weights   11/20/15 1431  Weight: 184 lb (83.5 kg)   Body mass index is 24.95 kg/m.   Physical Exam  Constitutional: He appears well-developed and well-nourished. No distress.  Cardiovascular: Normal rate, regular rhythm and normal heart sounds.   Pulmonary/Chest: Effort normal and breath sounds normal. No respiratory distress. He has no wheezes. He has no rales. He exhibits no tenderness.  Musculoskeletal: He exhibits no edema.  No pain with palpation across lower back, no increased pain with flexion or extension of lower back  Neurological:  Normal sensation and strength in LE b/l.  Gait is normal.   Skin: He is not diaphoretic.          Assessment & Plan:   See Problem List for Assessment and Plan of chronic medical problems.

## 2015-11-22 ENCOUNTER — Encounter: Payer: Self-pay | Admitting: Internal Medicine

## 2015-11-27 DIAGNOSIS — C61 Malignant neoplasm of prostate: Secondary | ICD-10-CM | POA: Diagnosis not present

## 2015-11-30 NOTE — Progress Notes (Signed)
[name] [place], [place] [address] Phone: [phone] Subjective:    I'm seeing this patient by the request  of:  Binnie Rail, MD   CC: back pain   [id number]  Tom Gaines is a 70 y.o. male coming in with complaint of low back pain. Past medical history significant for back surgery in 2002 herniated disc. Had been doing well until last usually is increased frequency of chronic back pain. Patient states it seems to be worse in the morning. Can be very painful and in the day as well. Patient states seems worse as standing and better with sitting. Has done formal physical therapy and given home exercises that to lungs were discontinued. Doesn't work on a regular basis. Occasional radiation down the right leg but denies any weakness or any true numbness. Past medical history significant for prostate cancer   patient did have x-rays 11/20/2015. These were independently visualized by me showing mild narrowing between L4-L5. History of a microdiscectomy L3-L4.  Past Medical History:  Diagnosis Date  . Gout   . Prostate cancer ([place]) 1998   Past Surgical History:  Procedure Laterality Date  . colonoscopy with polypectomy  2014   Dr Fuller Plan  . LUMBAR DISC SURGERY  2002   Dr Ellene Route  . PROSTATECTOMY  1998  . SHOULDER ARTHROSCOPY     right; 2011; left 1990  . TONSILLECTOMY AND ADENOIDECTOMY     Social History   Social History  . Marital status: Married    Spouse name: N/A  . Number of children: N/A  . Years of education: N/A   Social History Main Topics  . Smoking status: Former Smoker    Quit date: 01/31/1985  . Smokeless tobacco: Never Used     Comment: smoked  [place], up to 2.5 ppd  . Alcohol use No     Comment:  none since 2012  . Drug use: No  . Sexual activity: Not Asked   Other Topics Concern  . None   Social History Narrative   Exercises regularly - gym 5 days a week   Allergies  Allergen Reactions  .  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 Diabetes Mother   . Diabetes Sister   . Lung cancer      maternal uncle & aunt  . Colon cancer Neg Hx   . Stomach cancer Neg Hx   . Arthritis Neg Hx   . Heart disease Neg Hx   . Stroke Neg Hx     Past medical history, social, surgical and family history all reviewed in electronic medical record.  No pertanent information unless stated regarding to the chief complaint.   Review of Systems: No headache, visual changes, nausea, vomiting, diarrhea, constipation, dizziness, abdominal pain, skin rash, fevers, chills, night sweats, weight loss, swollen lymph nodes, body aches, joint swelling, muscle aches, chest pain, shortness of breath, mood changes.   Objective  Blood pressure 120/76, pulse 75, height 6' (1.829 m), weight 184 lb (83.5 kg), SpO2 95 %.  General: No apparent distress alert and oriented x3 mood and affect normal, dressed appropriately.  HEENT: Pupils equal, extraocular movements intact  Respiratory: Patient's speak in full sentences and does not appear short of breath  Cardiovascular: No lower extremity edema, non tender, no erythema  Skin: Warm  dry intact with no signs of infection or rash on extremities or on axial skeleton.  Abdomen: Soft nontender  Neuro: Cranial nerves II through XII are intact, neurovascularly intact in all extremities with 2+ DTRs and 2+ pulses.  Lymph: No lymphadenopathy of posterior or anterior cervical chain or axillae bilaterally.  Gait normal with good balance and coordination.  MSK:  Non tender with full range of motion and good stability and symmetric strength and tone of shoulders, elbows, wrist, hip, knee and ankles bilaterally. Mild arthritic changes of multiple joints Back Exam:  Inspection: Unremarkable except for some very mild increasing kyphosis  Motion: Flexion 45 deg, Extension 25 deg, Side  Bending to 45 deg bilaterally,  Rotation to 45 deg bilaterally  SLR laying: Negative  XSLR laying: Negative  Palpable tenderness: Mild tenderness to palpation in the paraspinal musculature of the lumbar spine on the right side. FABER: negative. Sensory change: Gross sensation intact to all lumbar and sacral dermatomes.  Reflexes: 2+ at both patellar tendons, 2+ at achilles tendons, Babinski's downgoing.  Strength at foot  Plantar-flexion: 5/5 Dorsi-flexion: 5/5 Eversion: 5/5 Inversion: 5/5  Leg strength  Quad: 5/5 Hamstring: 5/5 Hip flexor: 5/5 Hip abductors: 5/5  Gait unremarkable.  Osteopathic findings T3 extended rotated and side bent right  T8 extended rotated and side bent left  L2 flexed rotated and side bent right Sacrum left on left  Procedure note 97110; 15 minutes spent for Therapeutic exercises as stated in above notes.  This included exercises focusing on stretching, strengthening, with significant focus on eccentric aspects. Low back exercises that included:  Pelvic tilt/bracing instruction to focus on control of the pelvic girdle and lower abdominal muscles  Glute strengthening exercises, focusing on proper firing of the glutes without engaging the low back muscles Proper stretching techniques for maximum relief for the hamstrings, hip flexors, low back and some rotation where tolerated    Proper technique shown and discussed handout in great detail with ATC.  All questions were discussed and answered.      Impression and Recommendations:     This case required medical decision making of moderate complexity.      Note: This dictation was prepared with Dragon dictation along with smaller phrase technology. Any transcriptional errors that result from this process are unintentional.

## 2015-12-01 ENCOUNTER — Encounter: Payer: Self-pay | Admitting: Family Medicine

## 2015-12-01 ENCOUNTER — Ambulatory Visit (INDEPENDENT_AMBULATORY_CARE_PROVIDER_SITE_OTHER): Payer: Medicare Other | Admitting: Family Medicine

## 2015-12-01 DIAGNOSIS — G8929 Other chronic pain: Secondary | ICD-10-CM | POA: Diagnosis not present

## 2015-12-01 DIAGNOSIS — M999 Biomechanical lesion, unspecified: Secondary | ICD-10-CM | POA: Diagnosis not present

## 2015-12-01 DIAGNOSIS — M545 Low back pain: Secondary | ICD-10-CM

## 2015-12-01 HISTORY — DX: Biomechanical lesion, unspecified: M99.9

## 2015-12-01 NOTE — Assessment & Plan Note (Signed)
Low back pain is positive for more of mild osteophytic changes and does have a history of microdiscectomy. Patient has done very well continuing core strengthening exercises. Patient work with Product/process development scientist to learn them in greater detail. Responded very well to osteopathic manipulation. Discussed over-the-counter medications a could be beneficial. Patient come back and see me again in 4-6 weeks for further evaluation and treatment. No signs of radiculopathy.

## 2015-12-01 NOTE — Assessment & Plan Note (Signed)
Decision today to treat with OMT was based on Physical Exam  After verbal consent patient was treated with HVLA, ME, techniques in thoracic, lumbar and sacral areas  Patient tolerated the procedure well with improvement in symptoms  Patient given exercises, stretches and lifestyle modifications  See medications in patient instructions if given  Patient will follow up in 4-6 weeks

## 2015-12-01 NOTE — Patient Instructions (Signed)
Good to see you.  Ice 20 minutes 2 times daily. Usually after activity and before bed. Exercises 3 times a week.  Tylenol 325 mg 3 times a day can be helpful Vitamin D 2000 Iu daily  Turmeric 500mg  twice daily continue Tart cherry extract any dose (puritan pure or NOW are good brands) Mattress ([place], foster ad son or beauty rest seem to be best and firmer the better.) See me again in 4-6 weeks

## 2016-01-05 NOTE — Progress Notes (Signed)
[name] [address] [place], [place] [address] Phone: [phone] Subjective:    I'm seeing this patient by the request  of:  Binnie Rail, MD   CC: back pain f/u   [id number]  Tom Gaines is a 70 y.o. male coming in with complaint of low back pain. Past medical history significant for back surgery in 2002 herniated disc. Patient was found mild arthritis in his back. Patient history of gout as well as prostate cancer. Duration, exercises and over-the-counter medications. Patient states doing well overall./  Staying active. Patient is been doing the exercises fairly regularly. No numbness. Continues to be active otherwise. No acute findings   patient did have x-rays 11/20/2015. These were independently visualized by me showing mild narrowing between L4-L5. History of a microdiscectomy L3-L4.  Past Medical History:  Diagnosis Date  . Gout   . Prostate cancer ([facility]) 1998   Past Surgical History:  Procedure Laterality Date  . colonoscopy with polypectomy  2014   Dr Fuller Plan  . LUMBAR DISC SURGERY  2002   Dr Ellene Route  . PROSTATECTOMY  1998  . SHOULDER ARTHROSCOPY     right; 2011; left 1990  . TONSILLECTOMY AND ADENOIDECTOMY     Social History   Social History  . Marital status: Married    Spouse name: N/A  . Number of children: N/A  . Years of education: N/A   Social History Main Topics  . Smoking status: Former Smoker    Quit date: 01/31/1985  . Smokeless tobacco: Never Used     Comment: smoked  [place], up to 2.5 ppd  . Alcohol use No     Comment:  none since 2012  . Drug use: No  . Sexual activity: Not Asked   Other Topics Concern  . None   Social History Narrative   Exercises regularly - gym 5 days a week   Allergies  Allergen Reactions  . 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 Diabetes Mother   . Diabetes  Sister   . Lung cancer      maternal uncle & aunt  . Colon cancer Neg Hx   . Stomach cancer Neg Hx   . Arthritis Neg Hx   . Heart disease Neg Hx   . Stroke Neg Hx     Past medical history, social, surgical and family history all reviewed in electronic medical record.  No pertanent information unless stated regarding to the chief complaint.   Review of Systems: No headache, visual changes, nausea, vomiting, diarrhea, constipation, dizziness, abdominal pain, skin rash, fevers, chills, night sweats, weight loss, swollen lymph nodes, body aches, joint swelling, muscle aches, chest pain, shortness of breath, mood changes.   Objective  Blood pressure 122/62, pulse 68, height 6' (1.829 m), weight 185 lb (83.9 kg), SpO2 95 %.  Systems examined below as of 01/06/16 General: NAD A&O x3 mood, affect normal  HEENT: Pupils equal, extraocular movements intact no nystagmus Respiratory: not short of breath at rest or with speaking Cardiovascular: No lower extremity edema, non tender Skin: Warm dry intact with no signs of infection or rash on extremities or on axial skeleton. Abdomen: Soft nontender, no masses Neuro: Cranial nerves  intact, neurovascularly intact in all extremities with 2+ DTRs and 2+ pulses. Lymph: No lymphadenopathy appreciated today  Gait  normal with good balance and coordination.  MSK:  Non tender with full range of motion and good stability and symmetric strength and tone of shoulders, elbows, wrist, hip, knee and ankles bilaterally. Mild arthritic changes of multiple joints Back Exam:  Inspection: Unremarkable except for some very mild increasing kyphosis  Motion: Flexion 45 deg, Extension 25 deg, Side Bending to 45 deg bilaterally,  Rotation to 45 deg bilaterally  SLR laying: Negative  XSLR laying: Negative  Palpable tenderness: Mild tenderness to palpation in the paraspinal musculature of the lumbar spine on the right side. FABER: negative. Sensory change: Gross sensation  intact to all lumbar and sacral dermatomes.  Reflexes: 2+ at both patellar tendons, 2+ at achilles tendons, Babinski's downgoing.  Strength at foot  Plantar-flexion: 5/5 Dorsi-flexion: 5/5 Eversion: 5/5 Inversion: 5/5  Leg strength  Quad: 5/5 Hamstring: 5/5 Hip flexor: 5/5 Hip abductors: 4/5  Gait unremarkable.  Osteopathic findings T3 extended rotated and side bent right  T6 extended rotated and side bent left  L3 flexed rotated and side bent right Sacrum left on left      Impression and Recommendations:     This case required medical decision making of moderate complexity.      Note: This dictation was prepared with Dragon dictation along with smaller phrase technology. Any transcriptional errors that result from this process are unintentional.

## 2016-01-06 ENCOUNTER — Ambulatory Visit (INDEPENDENT_AMBULATORY_CARE_PROVIDER_SITE_OTHER): Payer: Medicare Other | Admitting: Family Medicine

## 2016-01-06 ENCOUNTER — Encounter: Payer: Self-pay | Admitting: Family Medicine

## 2016-01-06 VITALS — BP 122/62 | HR 68 | Ht 72.0 in | Wt 185.0 lb

## 2016-01-06 DIAGNOSIS — G8929 Other chronic pain: Secondary | ICD-10-CM

## 2016-01-06 DIAGNOSIS — M999 Biomechanical lesion, unspecified: Secondary | ICD-10-CM | POA: Diagnosis not present

## 2016-01-06 DIAGNOSIS — M545 Low back pain, unspecified: Secondary | ICD-10-CM

## 2016-01-06 NOTE — Assessment & Plan Note (Signed)
Seems stable at this time. Mild arthritic changes noted on x-ray previously. Patient does respond fairly well to conservative therapy. His lungs patient as well as he can follow-up again in 8 weeks. Continue same medications. Still responds well to osteopathic manipulation.

## 2016-01-06 NOTE — Patient Instructions (Addendum)
Good to see you  Stay active  Continue to work on core, posture and hip flexors.  Keep hands within peripheral vision See me again if the shoulder does not resolve.  You did great for 6 weeks and lets space you out to 8 weeks.  Happy holidays!

## 2016-01-06 NOTE — Assessment & Plan Note (Signed)
Decision today to treat with OMT was based on Physical Exam  After verbal consent patient was treated with HVLA, ME, techniques in thoracic, lumbar and sacral areas  Patient tolerated the procedure well with improvement in symptoms  Patient given exercises, stretches and lifestyle modifications  See medications in patient instructions if given  Patient will follow up in 8 weeks

## 2016-01-11 DIAGNOSIS — C61 Malignant neoplasm of prostate: Secondary | ICD-10-CM | POA: Diagnosis not present

## 2016-02-10 ENCOUNTER — Encounter: Payer: Self-pay | Admitting: Family Medicine

## 2016-02-17 ENCOUNTER — Ambulatory Visit: Payer: Medicare Other | Admitting: Family Medicine

## 2016-03-06 ENCOUNTER — Encounter: Payer: Self-pay | Admitting: Internal Medicine

## 2016-03-08 DIAGNOSIS — L821 Other seborrheic keratosis: Secondary | ICD-10-CM | POA: Diagnosis not present

## 2016-03-08 DIAGNOSIS — L570 Actinic keratosis: Secondary | ICD-10-CM | POA: Diagnosis not present

## 2016-03-08 DIAGNOSIS — D2361 Other benign neoplasm of skin of right upper limb, including shoulder: Secondary | ICD-10-CM | POA: Diagnosis not present

## 2016-03-26 ENCOUNTER — Encounter: Payer: Self-pay | Admitting: Family Medicine

## 2016-03-29 ENCOUNTER — Encounter: Payer: Self-pay | Admitting: Internal Medicine

## 2016-03-29 DIAGNOSIS — Z8739 Personal history of other diseases of the musculoskeletal system and connective tissue: Secondary | ICD-10-CM

## 2016-03-29 DIAGNOSIS — R7303 Prediabetes: Secondary | ICD-10-CM

## 2016-03-29 DIAGNOSIS — E7800 Pure hypercholesterolemia, unspecified: Secondary | ICD-10-CM

## 2016-03-29 HISTORY — DX: Prediabetes: R73.03

## 2016-03-30 ENCOUNTER — Encounter: Payer: Self-pay | Admitting: Internal Medicine

## 2016-03-30 ENCOUNTER — Other Ambulatory Visit (INDEPENDENT_AMBULATORY_CARE_PROVIDER_SITE_OTHER): Payer: Medicare Other

## 2016-03-30 ENCOUNTER — Ambulatory Visit (INDEPENDENT_AMBULATORY_CARE_PROVIDER_SITE_OTHER): Payer: Medicare Other | Admitting: Internal Medicine

## 2016-03-30 VITALS — BP 114/84 | HR 67 | Temp 97.7°F | Resp 16 | Wt 190.0 lb

## 2016-03-30 DIAGNOSIS — Z0000 Encounter for general adult medical examination without abnormal findings: Secondary | ICD-10-CM | POA: Diagnosis not present

## 2016-03-30 DIAGNOSIS — E7800 Pure hypercholesterolemia, unspecified: Secondary | ICD-10-CM

## 2016-03-30 DIAGNOSIS — R7303 Prediabetes: Secondary | ICD-10-CM

## 2016-03-30 DIAGNOSIS — C4491 Basal cell carcinoma of skin, unspecified: Secondary | ICD-10-CM

## 2016-03-30 DIAGNOSIS — Z8546 Personal history of malignant neoplasm of prostate: Secondary | ICD-10-CM

## 2016-03-30 DIAGNOSIS — Z8739 Personal history of other diseases of the musculoskeletal system and connective tissue: Secondary | ICD-10-CM | POA: Diagnosis not present

## 2016-03-30 LAB — COMPREHENSIVE METABOLIC PANEL
ALK PHOS: 41 U/L (ref 39–117)
ALT: 16 U/L (ref 0–53)
AST: 24 U/L (ref 0–37)
Albumin: 4.2 g/dL (ref 3.5–5.2)
BUN: 23 mg/dL (ref 6–23)
CO2: 29 mEq/L (ref 19–32)
Calcium: 9.6 mg/dL (ref 8.4–10.5)
Chloride: 108 mEq/L (ref 96–112)
Creatinine, Ser: 1.46 mg/dL (ref 0.40–1.50)
GFR: 50.6 mL/min — AB (ref >60.00)
GLUCOSE: 93 mg/dL (ref 70–99)
POTASSIUM: 4.3 meq/L (ref 3.5–5.1)
Sodium: 143 mEq/L (ref 135–145)
TOTAL PROTEIN: 6.7 g/dL (ref 6.0–8.3)
Total Bilirubin: 0.4 mg/dL (ref 0.2–1.2)

## 2016-03-30 LAB — CBC WITH DIFFERENTIAL/PLATELET
Basophils Absolute: 0.1 10*3/uL (ref 0.0–0.1)
Basophils Relative: 1 % (ref 0.0–3.0)
EOS PCT: 2.6 % (ref 0.0–5.0)
Eosinophils Absolute: 0.2 10*3/uL (ref 0.0–0.7)
HEMATOCRIT: 42.2 % (ref 39.0–52.0)
HEMOGLOBIN: 14.1 g/dL (ref 13.0–17.0)
LYMPHS ABS: 1.7 10*3/uL (ref 0.7–4.0)
LYMPHS PCT: 29.3 % (ref 12.0–46.0)
MCHC: 33.3 g/dL (ref 30.0–36.0)
MCV: 93.4 fl (ref 78.0–100.0)
MONOS PCT: 12.2 % — AB (ref 3.0–12.0)
Monocytes Absolute: 0.7 10*3/uL (ref 0.1–1.0)
Neutro Abs: 3.2 10*3/uL (ref 1.4–7.7)
Neutrophils Relative %: 54.9 % (ref 43.0–77.0)
Platelets: 217 10*3/uL (ref 150.0–400.0)
RBC: 4.52 Mil/uL (ref 4.22–5.81)
RDW: 15.4 % (ref 11.5–15.5)
WBC: 5.8 10*3/uL (ref 4.0–10.5)

## 2016-03-30 LAB — HEMOGLOBIN A1C: HEMOGLOBIN A1C: 5.7 % (ref 4.6–6.5)

## 2016-03-30 LAB — URIC ACID: Uric Acid, Serum: 4.4 mg/dL (ref 4.0–7.8)

## 2016-03-30 LAB — LIPID PANEL
Cholesterol: 154 mg/dL (ref 0–200)
HDL: 56.5 mg/dL (ref >39.00)
LDL CALC: 84 mg/dL (ref 0–99)
NonHDL: 97.39
Total CHOL/HDL Ratio: 3
Triglycerides: 69 mg/dL (ref 0.0–149.0)
VLDL: 13.8 mg/dL (ref 0.0–40.0)

## 2016-03-30 LAB — TSH: TSH: 1.67 u[IU]/mL (ref 0.35–4.50)

## 2016-03-30 NOTE — Assessment & Plan Note (Addendum)
No gout since he was here last Check uric acid level Continue daily allopurinol

## 2016-03-30 NOTE — Progress Notes (Signed)
Subjective:    Patient ID: Tom Gaines, male    DOB: Aug 11, 1945, 71 y.o.   MRN: [id number]  HPI Here for medicare wellness exam and follow up of his chronic medical problems.   I have personally reviewed and have noted 1.The patient's medical and social history 2.Their use of alcohol, tobacco or illicit drugs 3.Their current medications and supplements 4.The patient's functional ability including ADL's, fall risks, home safety risks and hearing or visual impairment. 5.Diet and physical activities 6.Evidence for depression or mood disorders 7.Care team reviewed  -  Sports med - Dr Tamala Julian, Urology - Dr Rosana Hoes, Derm - Dr Ronnald Ramp  She gets sinus infections at least once a year.  It lasts 10 days.  He uses a netti pot any time he has congestion.   He wonders if there is a way to prevent them or if he should see ENT.  Prediabetes:  He is compliant with a low sugar/carbohydrate diet.  He is exercising regularly.  Hyperlipidemia: He is taking his medication daily. He is compliant with a low fat/cholesterol diet. He is exercising regularly. He denies myalgias.   H/o prostate cancer:  He is s/p prostatectomy years ago.  He is following with urology - Dr Rosana Hoes.   Gout:  He is taking allopurinol daily.  He denies any occurrences of gout since he was here last.   [facility] under [facility]70 [facility]  Are there smokers in your home (other than you)? No  Risk Factors Exercise: regular, 5 days a week Dietary issues discussed:  Eats healthy - oatmeal/fruits, avoids sweets, well balanced  Cardiac risk factors: advanced age,, hyperlipidemia  Depression Screen  Have you felt down, depressed or hopeless? No  Have you felt little interest or pleasure in doing things?  No  Activities of Daily Living In your present state of health, do you have any difficulty performing the following activities?:  Driving?  No Managing money?  No Feeding yourself? No Getting from bed to chair? No Climbing a flight of stairs? No Preparing food and eating?: No Bathing or showering? No Getting dressed: No Getting to/using the toilet? No Moving around from place to place: No In the past year have you fallen or had a near fall?: No   Are you sexually active?  No  Do you have more than one partner?  No   Hearing Difficulties:  Do you often ask people to speak up or repeat themselves? No Do you experience ringing or noises in your ears? Yes  Do you have difficulty understanding soft or whispered voices? yes Vision:              Any change in vision:  no             Up to date with eye exam:   Up to date  Memory:  Do you feel that you have a problem with memory? No  Do you often misplace items? No  Do you feel safe at home?  Yes  Cognitive Testing  Alert, Orientated? Yes  Normal Appearance? Yes  Recall of three objects?  Yes  Can perform simple calculations? Yes  Displays appropriate judgment? Yes  Can read the correct time from a watch face? Yes   Advanced Directives have been discussed with the patient? Yes - no questions   Medications and allergies reviewed with patient and updated if appropriate.  Patient Active Problem List   Diagnosis Date Noted  .  Prediabetes 03/29/2016  . Nonallopathic lesion of lumbosacral region 12/01/2015  . Nonallopathic lesion of sacral region 12/01/2015  . Nonallopathic lesion of thoracic region 12/01/2015  . Chest wall discomfort 11/20/2015  . Low back pain 03/13/2014  . Basal cell carcinoma 02/13/2013  . Hyperlipidemia 03/12/2008  . History of gout 03/12/2008  . PROSTATE CANCER, HX OF 03/12/2008  . History of colonic polyps 03/12/2008    Current Outpatient Prescriptions on File Prior to Visit  Medication Sig Dispense Refill  . acyclovir (ZOVIRAX) 800 MG tablet Take 400 mg by mouth 2 (two) times daily.     Marland Kitchen allopurinol (ZYLOPRIM) 300 MG tablet Take 1  tablet (300 mg total) by mouth daily. 90 tablet 3  . Ascorbic Acid (VITAMIN C) 100 MG tablet Take 100 mg by mouth daily.    Marland Kitchen aspirin 81 MG tablet Take 81 mg by mouth daily.      . cetirizine (ZYRTEC) 10 MG tablet Take 10 mg by mouth as needed.     . Cholecalciferol (VITAMIN D) 2000 units CAPS Take 1 capsule by mouth daily.    Marland Kitchen CINNAMON PO Take 500 mg by mouth daily.    . fenofibrate (TRICOR) 145 MG tablet Take 1 tablet (145 mg total) by mouth daily. 90 tablet 3  . fluticasone (FLONASE) 50 MCG/ACT nasal spray USE 1 SPRAY NASALLY TWICE A DAY AS NEEDED 48 g 1  . Misc Natural Products (TART CHERRY ADVANCED PO) Take 1 capsule by mouth daily.    . Multiple Vitamin (MULTIVITAMIN) tablet Take 1 tablet by mouth daily.      . naproxen sodium (ALEVE) 220 MG tablet Take 220 mg by mouth as needed.      . Omega-3 Fatty Acids (FISH OIL) 1000 MG CAPS Take 1,000 mg by mouth. 1 by mouth daily    . Turmeric Curcumin 500 MG CAPS Take 500 mg by mouth 2 (two) times daily.     No current facility-administered medications on file prior to visit.     Past Medical History:  Diagnosis Date  . Gout   . Prostate cancer ([facility]) 1998    Past Surgical History:  Procedure Laterality Date  . colonoscopy with polypectomy  2014   Dr Fuller Plan  . LUMBAR DISC SURGERY  2002   Dr Ellene Route  . PROSTATECTOMY  1998  . SHOULDER ARTHROSCOPY     right; 2011; left 1990  . TONSILLECTOMY AND ADENOIDECTOMY      Social History   Social History  . Marital status: Married    Spouse name: N/A  . Number of children: N/A  . Years of education: N/A   Social History Main Topics  . Smoking status: Former Smoker    Quit date: 01/31/1985  . Smokeless tobacco: Never Used     Comment: smoked  [place], up to 2.5 ppd  . Alcohol use No     Comment:  none since 2012  . Drug use: No  . Sexual activity: Not Asked   Other Topics Concern  . None   Social History Narrative   Exercises regularly - gym 5 days a week    Family History   Problem Relation Age of Onset  . Diabetes Mother   . Diabetes Sister   . Lung cancer      maternal uncle & aunt  . Colon cancer Neg Hx   . Stomach cancer Neg Hx   . Arthritis Neg Hx   . Heart disease Neg Hx   .  Stroke Neg Hx     Review of Systems  Constitutional: Negative for appetite change, chills, fatigue, fever and unexpected weight change.  HENT: Positive for hearing loss (mild) and tinnitus (mild).   Eyes: Negative for visual disturbance.  Respiratory: Positive for wheezing (at night on occ). Negative for cough and shortness of breath.   Cardiovascular: Negative for chest pain, palpitations and leg swelling.  Gastrointestinal: Negative for abdominal pain, blood in stool, constipation, diarrhea and nausea.       No gerd  Genitourinary: Negative for dysuria and hematuria.  Musculoskeletal: Positive for arthralgias (shoulder pain R > L) and back pain.  Skin: Negative for color change and rash.  Neurological: Negative for dizziness, light-headedness and headaches.  Psychiatric/Behavioral: Negative for dysphoric mood. The patient is not nervous/anxious.        Objective:   Vitals:   03/30/16 1540  BP: 114/84  Pulse: 67  Resp: 16  Temp: 97.7 F (36.5 C)   Filed Weights   03/30/16 1540  Weight: 190 lb (86.2 kg)   Body mass index is 25.77 kg/m.  Wt Readings from Last 3 Encounters:  03/30/16 190 lb (86.2 kg)  01/06/16 185 lb (83.9 kg)  12/01/15 184 lb (83.5 kg)     Physical Exam Constitutional: He appears well-developed and well-nourished. No distress.  HENT:  Head: Normocephalic and atraumatic.  Right Ear: External ear normal.  Left Ear: External ear normal.  Mouth/Throat: Oropharynx is clear and moist.  Normal ear canals and TM b/l  Eyes: Conjunctivae and EOM are normal.  Neck: Neck supple. No tracheal deviation present. No thyromegaly present.  No carotid bruit  Cardiovascular: Normal rate, regular rhythm, normal heart sounds and intact distal pulses.    No murmur heard. Pulmonary/Chest: Effort normal and breath sounds normal. No respiratory distress. He has no wheezes. He has no rales.  Abdominal: Soft. Bowel sounds are normal. He exhibits no distension. There is no tenderness.  Musculoskeletal: He exhibits no edema.  Lymphadenopathy:   He has no cervical adenopathy.  Skin: Skin is warm and dry. He is not diaphoretic.  Psychiatric: He has a normal mood and affect. His behavior is normal.         Assessment & Plan:   Wellness Exam: Immunizations   Up to date  Colonoscopy  Up to date  Eye exam   Up to date  Hearing loss - mild, positive tinnitus - does not bother him Memory concerns/difficulties   - none Independent of ADLs  fully Stressed the importance of regular exercise, which he does   Patient received copy of preventative screening tests/immunizations recommended for the next 5-10 years.  See Problem List for Assessment and Plan of chronic medical problems.  FU annually

## 2016-03-30 NOTE — Progress Notes (Signed)
Pre visit review using our clinic review tool, if applicable. No additional management support is needed unless otherwise documented below in the visit note. 

## 2016-03-30 NOTE — Assessment & Plan Note (Signed)
Following with urology

## 2016-03-30 NOTE — Patient Instructions (Addendum)
  Tom Gaines , Thank you for taking time to come for your Medicare Wellness Visit. I appreciate your ongoing commitment to your health goals. Please review the following plan we discussed and let me know if I can assist you in the future.   These are the goals we discussed: Goals    None      This is a list of the screening recommended for you and due dates:  Health Maintenance  Topic Date Due  . Colon Cancer Screening  02/05/2017  . Tetanus Vaccine  03/12/2018  . Flu Shot  Addressed  .  Hepatitis C: One time screening is recommended by Center for Disease Control  (CDC) for  adults born from [birth year] through 1965.   Completed  . Pneumonia vaccines  Completed     We reviewed your blood work.   All other Health Maintenance issues reviewed.   All recommended immunizations and age-appropriate screenings are up-to-date or discussed.  No immunizations administered today.   Medications reviewed and updated.  Changes include  /  No changes recommended at this time.    Please followup in one year for a wellness exam

## 2016-03-30 NOTE — Assessment & Plan Note (Signed)
Check lipid panel  Continue daily fibrate Regular exercise and healthy diet encouraged  

## 2016-03-30 NOTE — Assessment & Plan Note (Signed)
Sees Dr Ronnald Ramp annually

## 2016-03-30 NOTE — Assessment & Plan Note (Signed)
Check a1c Low sugar / carb diet Stressed regular exercise, keeping weight down/weight loss 

## 2016-04-17 ENCOUNTER — Other Ambulatory Visit: Payer: Self-pay | Admitting: Internal Medicine

## 2016-05-13 ENCOUNTER — Encounter: Payer: Self-pay | Admitting: Internal Medicine

## 2016-05-16 NOTE — Progress Notes (Signed)
[name] [address], [place] [address] Phone: [phone] Subjective:    I'm seeing this patient by the request  of:  Binnie Rail, MD   CC: Shoulder issues.  WER:[id number]  Tom Gaines is a 71 y.o. male coming in with complaint of shoulder pain issues. This is a new problem for patient. Patient is and is having right greater than left shoulder pain. Patient states as a dull, throbbing aching sensation that can wake him up at night. States that it seems worse when he puts pressure on the shoulder itself. Certain lifting and certain movements causes a sharp pain that lasts seconds. Patient rates the severity of pain is 4 out of 10. Patient has a past medical history significant for rotator cuff repair bilaterally. Patient was to avoid getting worse. Denies any weakness, any radiation of pain.     Past Medical History:  Diagnosis Date  . Gout   . Prostate cancer ([place]) 1998   Past Surgical History:  Procedure Laterality Date  . CATARACT EXTRACTION, BILATERAL  2017  . colonoscopy with polypectomy  2014   Dr Fuller Plan  . LUMBAR DISC SURGERY  2002   Dr Ellene Route  . PROSTATECTOMY  1998  . SHOULDER ARTHROSCOPY     right; 2011; left 1990  . TONSILLECTOMY AND ADENOIDECTOMY     Social History   Social History  . Marital status: Married    Spouse name: N/A  . Number of children: N/A  . Years of education: N/A   Social History Main Topics  . Smoking status: Former Smoker    Quit date: 01/31/1985  . Smokeless tobacco: Never Used     Comment: smoked  Hand, up to 2.5 ppd  . Alcohol use No     Comment:  none since 2012  . Drug use: No  . Sexual activity: Not on file   Other Topics Concern  . Not on file   Social History Narrative   Exercises regularly - gym 5 days a week   Allergies  Allergen Reactions  . 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 Diabetes Mother   . Dementia Mother   . Diabetes Sister   . Lung cancer      maternal uncle & aunt  . Colon cancer Neg Hx   . Stomach cancer Neg Hx   . Arthritis Neg Hx   . Heart disease Neg Hx   . Stroke Neg Hx     Past medical history, social, surgical and family history all reviewed in electronic medical record.  No pertanent information unless stated regarding to the chief complaint.   Review of Systems:Review of systems updated and as accurate as of 05/16/16  No headache, visual changes, nausea, vomiting, diarrhea, constipation, dizziness, abdominal pain, skin rash, fevers, chills, night sweats, weight loss, swollen lymph nodes, body aches, joint swelling,  chest pain, shortness of breath, mood changes. Positive muscle aches  Objective  There were no vitals taken for this visit. Systems examined below as of 05/16/16   General: No apparent distress alert and oriented x3 mood and affect normal, dressed appropriately.  HEENT: Pupils equal, extraocular movements intact  Respiratory: Patient's speak in full sentences and does not appear short of breath  Cardiovascular: No lower extremity edema, non tender, no erythema  Skin:  Warm dry intact with no signs of infection or rash on extremities or on axial skeleton.  Abdomen: Soft nontender  Neuro: Cranial nerves II through XII are intact, neurovascularly intact in all extremities with 2+ DTRs and 2+ pulses.  Lymph: No lymphadenopathy of posterior or anterior cervical chain or axillae bilaterally.  Gait normal with good balance and coordination.  MSK:  Non tender with full range of motion and good stability and symmetric strength and tone of  elbows, wrist, hip, knee and ankles bilaterally.  Shoulder: Right Inspection reveals no abnormalities, atrophy or asymmetry. Palpation is normal with no tenderness over AC joint or bicipital groove. ROM is full in all planes passively. Rotator cuff strength normal  throughout. signs of impingement with positive Neer and Hawkin's tests, but negative empty can sign. Speeds and Yergason's tests normal. No labral pathology noted with negative Obrien's, negative clunk and good stability. Normal scapular function observed. No painful arc and no drop arm sign. No apprehension sign  MSK US performed of: Right This study was ordered, performed, and interpreted by Charlann Boxer D.O.  Shoulder:   Supraspinatus:  Appears normal on long and transverse views, Bursal bulge seen with shoulder abduction on impingement view. Infraspinatus:  Appears normal on long and transverse views. Significant increase in Doppler flow Subscapularis:  Appears normal on long and transverse views. Positive bursa Teres Minor:  Appears normal on long and transverse views. AC joint:  Capsule undistended, no geyser sign. Glenohumeral Joint:  Appears normal without effusion. Glenoid Labrum:  Intact without visualized tears. Biceps Tendon:  Appears normal on long and transverse views, no fraying of tendon, tendon located in intertubercular groove, no subluxation with shoulder internal or external rotation.  Impression: Subacromial bursitis  Procedure note 09381; 15 minutes spent for Therapeutic exercises as stated in above notes.  This included exercises focusing on stretching, strengthening, with significant focus on eccentric aspects.  Shoulder Exercises that included:  Basic scapular stabilization to include adduction and depression of scapula Scaption, focusing on proper movement and good control Internal and External rotation utilizing a theraband, with elbow tucked at side entire time Rows with theraband   Proper technique shown and discussed handout in great detail with ATC.  All questions were discussed and answered.     Impression and Recommendations:     This case required medical decision making of moderate complexity.      Note: This dictation was prepared with Dragon  dictation along with smaller phrase technology. Any transcriptional errors that result from this process are unintentional.

## 2016-05-17 ENCOUNTER — Ambulatory Visit (INDEPENDENT_AMBULATORY_CARE_PROVIDER_SITE_OTHER): Payer: Medicare Other | Admitting: Family Medicine

## 2016-05-17 ENCOUNTER — Ambulatory Visit: Payer: Self-pay

## 2016-05-17 ENCOUNTER — Encounter: Payer: Self-pay | Admitting: Family Medicine

## 2016-05-17 VITALS — BP 128/84 | HR 62 | Resp 16 | Wt 190.0 lb

## 2016-05-17 DIAGNOSIS — M7551 Bursitis of right shoulder: Secondary | ICD-10-CM | POA: Diagnosis not present

## 2016-05-17 DIAGNOSIS — M25511 Pain in right shoulder: Secondary | ICD-10-CM

## 2016-05-17 NOTE — Assessment & Plan Note (Signed)
Patient has what appears to be an acute shoulder bursitis. Given home exercises today. Patient will try topical anti-inflammatories. We discussed icing regimen. Which activities to potentially avoid. Patient will back in 3-4 weeks and if worsening symptoms consider injection as well as formal physical therapy.

## 2016-05-17 NOTE — Progress Notes (Signed)
Pre-visit discussion using our clinic review tool. No additional management support is needed unless otherwise documented below in the visit note.  

## 2016-05-17 NOTE — Patient Instructions (Addendum)
Good to see you  Tom Gaines is your friend. Especially at night  Keep hands within peripheral vision.  Pennsaid topically at night, about a fingertip amount Decrease weights by 50% and increase 10% a week.  See em again in 4 weeks if not better or to manipulate your back.

## 2016-06-13 ENCOUNTER — Encounter: Payer: Self-pay | Admitting: Family Medicine

## 2016-06-14 ENCOUNTER — Ambulatory Visit: Payer: Medicare Other | Admitting: Family Medicine

## 2016-06-18 ENCOUNTER — Encounter: Payer: Self-pay | Admitting: Family Medicine

## 2016-06-18 ENCOUNTER — Ambulatory Visit (INDEPENDENT_AMBULATORY_CARE_PROVIDER_SITE_OTHER): Payer: Medicare Other | Admitting: Family Medicine

## 2016-06-18 VITALS — BP 108/72 | HR 63 | Temp 98.2°F | Wt 185.0 lb

## 2016-06-18 DIAGNOSIS — R066 Hiccough: Secondary | ICD-10-CM

## 2016-06-18 DIAGNOSIS — J029 Acute pharyngitis, unspecified: Secondary | ICD-10-CM

## 2016-06-18 LAB — POCT RAPID STREP A (OFFICE): Rapid Strep A Screen: NEGATIVE

## 2016-06-18 NOTE — Progress Notes (Signed)
[place] [facility] [address] [place], [place] [address] [phone]  Date:  06/18/2016   Name:  Tom Gaines   DOB:  [birth year]   MRN:  [id number]  PCP:  Binnie Rail, MD    Chief Complaint: Sore Throat (Started Monday. )   History of Present Illness:  Tom Gaines is a 71 y.o. very pleasant male patient who presents with the following:  History of gout, prostate cancer, pre-diabetes  Today is Sat- he notes that on Monday evening he developed a sore throat. Then on Thursday he started to notice hiccups. These persisted on Thursday, and woke him up a couple of times that night.  The hiccups became intermittent yesterday and were rare last night.  There are gone now- none so far today He has had prolonged hiccups a couple of times in the past, but they went away with antacids. Never had to seek MD treatment for same  No cough, no fever, no GI symptoms Ears are not painful, but they have popped a bit.   No chills or body aches  His wife was ill this week with some sort off illness but seems to be unrelated  Patient Active Problem List   Diagnosis Date Noted  . Acute shoulder bursitis, right 05/17/2016  . Prediabetes 03/29/2016  . Nonallopathic lesion of lumbosacral region 12/01/2015  . Nonallopathic lesion of sacral region 12/01/2015  . Nonallopathic lesion of thoracic region 12/01/2015  . Chest wall discomfort 11/20/2015  . Low back pain 03/13/2014  . Basal cell carcinoma 02/13/2013  . Hyperlipidemia 03/12/2008  . History of gout 03/12/2008  . PROSTATE CANCER, HX OF 03/12/2008  . History of colonic polyps 03/12/2008    Past Medical History:  Diagnosis Date  . Gout   . Prostate cancer ([place]) 1998    Past Surgical History:  Procedure Laterality Date  . CATARACT EXTRACTION, BILATERAL  2017  . colonoscopy with polypectomy  2014   Dr Fuller Plan  . LUMBAR DISC SURGERY  2002   Dr Ellene Route  . PROSTATECTOMY  1998   . SHOULDER ARTHROSCOPY     right; 2011; left 1990  . TONSILLECTOMY AND ADENOIDECTOMY      Social History  Substance Use Topics  . Smoking status: Former Smoker    Quit date: 01/31/1985  . Smokeless tobacco: Never Used     Comment: smoked  [place], up to 2.5 ppd  . Alcohol use No     Comment:  none since 2012    Family History  Problem Relation Age of Onset  . Diabetes Mother   . Dementia Mother   . Diabetes Sister   . Lung cancer Unknown        maternal uncle & aunt  . Colon cancer Neg Hx   . Stomach cancer Neg Hx   . Arthritis Neg Hx   . Heart disease Neg Hx   . Stroke Neg Hx     Allergies  Allergen Reactions  . 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  Current Outpatient Prescriptions on File Prior to Visit  Medication Sig Dispense Refill  . acetaminophen (TYLENOL) 500 MG tablet Take 500 mg by mouth every 6 (six) hours as needed.    Marland Kitchen acyclovir (ZOVIRAX) 800 MG tablet Take 400 mg by mouth  2 (two) times daily.     Marland Kitchen allopurinol (ZYLOPRIM) 300 MG tablet Take 1 tablet (300 mg total) by mouth daily. 90 tablet 3  . Ascorbic Acid (VITAMIN C) 100 MG tablet Take 100 mg by mouth daily.    Marland Kitchen aspirin 81 MG tablet Take 81 mg by mouth daily.      . cetirizine (ZYRTEC) 10 MG tablet Take 10 mg by mouth as needed.     . Cholecalciferol (VITAMIN D) 2000 units CAPS Take 1 capsule by mouth daily.    Marland Kitchen CINNAMON PO Take 500 mg by mouth daily.    . fenofibrate (TRICOR) 145 MG tablet TAKE 1 TABLET BY MOUTH  DAILY 90 tablet 3  . fluticasone (FLONASE) 50 MCG/ACT nasal spray USE 1 SPRAY NASALLY TWICE A DAY AS NEEDED 48 g 1  . Misc Natural Products (TART CHERRY ADVANCED PO) Take 1 capsule by mouth daily.    . Multiple Vitamin (MULTIVITAMIN) tablet Take 1 tablet by mouth daily.      . naproxen sodium (ALEVE) 220 MG tablet Take 220 mg by mouth as needed.      . Omega-3 Fatty Acids  (FISH OIL) 1000 MG CAPS Take 1,000 mg by mouth. 1 by mouth daily    . Turmeric Curcumin 500 MG CAPS Take 500 mg by mouth 2 (two) times daily.     No current facility-administered medications on file prior to visit.     Review of Systems:  As per HPI- otherwise negative. No fever, chills, CP, SOB.  He had noted that when his throat was sore it felt a bit hard to swallow, but no difficulty breathing.     Physical Examination: Vitals:   06/18/16 0914  BP: 108/72  Pulse: 63  Temp: 98.2 F (36.8 C)   Vitals:   06/18/16 0914  Weight: 185 lb (83.9 kg)   Body mass index is 25.09 kg/m. Ideal Body Weight:    GEN: WDWN, NAD, Non-toxic, A & O x 3, looks well HEENT: Atraumatic, Normocephalic. Neck supple. No masses, No LAD.  Bilateral TM wnl, oropharynx normal.  PEERL,EOMI.   Ears and Nose: No external deformity. CV: RRR, No M/G/R. No JVD. No thrill. No extra heart sounds. PULM: CTA B, no wheezes, crackles, rhonchi. No retractions. No resp. distress. No accessory muscle use. ABD: S, NT, ND EXTR: No c/c/e NEURO Normal gait.  PSYCH: Normally interactive. Conversant. Not depressed or anxious appearing.  Calm demeanor.   Results for orders placed or performed in visit on 06/18/16  POCT rapid strep A  Result Value Ref Range   Rapid Strep A Screen Negative Negative    Assessment and Plan: Pharyngitis, unspecified etiology - Plan: POCT rapid strep A  Hiccups  Here today for eval of hiccups and ST.  Both of these issues are now resolving  For the time being he will continue to observe and will seek care if either problem returns   Signed Lamar Blinks, MD

## 2016-06-18 NOTE — Patient Instructions (Signed)
Your strep is negative. Your hiccups were likely caused by some spasm and irration of the diaphragm muscle.   Since you are currently improving I do not think anything further is needed today.  However, if the hiccups do come back please contact Dr. Quay Burow as you may need medication

## 2016-06-21 DIAGNOSIS — H11132 Conjunctival pigmentations, left eye: Secondary | ICD-10-CM | POA: Diagnosis not present

## 2016-06-21 DIAGNOSIS — Z961 Presence of intraocular lens: Secondary | ICD-10-CM | POA: Diagnosis not present

## 2016-06-21 DIAGNOSIS — D3131 Benign neoplasm of right choroid: Secondary | ICD-10-CM | POA: Diagnosis not present

## 2016-06-21 DIAGNOSIS — H31091 Other chorioretinal scars, right eye: Secondary | ICD-10-CM | POA: Diagnosis not present

## 2016-07-06 DIAGNOSIS — C61 Malignant neoplasm of prostate: Secondary | ICD-10-CM | POA: Diagnosis not present

## 2016-07-07 NOTE — Progress Notes (Signed)
Subjective:    Patient ID: Tom Gaines, male    DOB: 16-Feb-1945, 71 y.o.   MRN: [id number]  HPI He is here for an acute visit.   Back pain:  He is still having the pain  - the pain is across his lower back and sometimes goes up the spine.  That is not new.  The pain has started to wrap around to his hips, which is new.  Years ago he had surgery by Dr Ellene Route.  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  He does not want surgery, but wants to know what other options he has.  He wants to know if this is something he needs to live with.   He denies numbness/tingling, pain and weakness in the legs.  He does back exercises daily and does yoga twice a week.  He has taken tylenol and aleve and they help a little.    Medications and allergies reviewed with patient and updated if appropriate.  Patient Active Problem List   Diagnosis Date Noted  . Acute shoulder bursitis, right 05/17/2016  . Prediabetes 03/29/2016  . Nonallopathic lesion of lumbosacral region 12/01/2015  . Nonallopathic lesion of sacral region 12/01/2015  . Nonallopathic lesion of thoracic region 12/01/2015  . Chest wall discomfort 11/20/2015  . Low back pain 03/13/2014  . Basal cell carcinoma 02/13/2013  . Hyperlipidemia 03/12/2008  . History of gout 03/12/2008  . PROSTATE CANCER, HX OF 03/12/2008  . History of colonic polyps 03/12/2008    Current Outpatient Prescriptions on File Prior to Visit  Medication Sig Dispense Refill  . acetaminophen (TYLENOL) 500 MG tablet Take 500 mg by mouth every 6 (six) hours as needed.    Marland Kitchen acyclovir (ZOVIRAX) 800 MG tablet Take 400 mg by mouth 2 (two) times daily.     Marland Kitchen allopurinol (ZYLOPRIM) 300 MG tablet Take 1 tablet (300 mg total) by mouth daily. 90 tablet 3  . Ascorbic Acid (VITAMIN C) 100 MG tablet Take 100 mg by mouth daily.    Marland Kitchen aspirin 81 MG tablet Take 81 mg by mouth daily.      . cetirizine (ZYRTEC) 10 MG tablet Take 10 mg by mouth as  needed.     . Cholecalciferol (VITAMIN D) 2000 units CAPS Take 1 capsule by mouth daily.    Marland Kitchen CINNAMON PO Take 500 mg by mouth daily.    . fenofibrate (TRICOR) 145 MG tablet TAKE 1 TABLET BY MOUTH  DAILY 90 tablet 3  . fluticasone (FLONASE) 50 MCG/ACT nasal spray USE 1 SPRAY NASALLY TWICE A DAY AS NEEDED 48 g 1  . Misc Natural Products (TART CHERRY ADVANCED PO) Take 1 capsule by mouth daily.    . Multiple Vitamin (MULTIVITAMIN) tablet Take 1 tablet by mouth daily.      . naproxen sodium (ALEVE) 220 MG tablet Take 220 mg by mouth as needed.      . Omega-3 Fatty Acids (FISH OIL) 1000 MG CAPS Take 1,000 mg by mouth. 1 by mouth daily    . Turmeric Curcumin 500 MG CAPS Take 500 mg by mouth 2 (two) times daily.     No current facility-administered medications on file prior to visit.     Past Medical History:  Diagnosis Date  . Gout   . Prostate cancer ([place]) 1998    Past Surgical History:  Procedure Laterality Date  . CATARACT EXTRACTION, BILATERAL  2017  . colonoscopy with polypectomy  2014   Dr Fuller Plan  . LUMBAR DISC SURGERY  2002   Dr Ellene Route  . PROSTATECTOMY  1998  . SHOULDER ARTHROSCOPY     right; 2011; left 1990  . TONSILLECTOMY AND ADENOIDECTOMY      Social History   Social History  . Marital status: Married    Spouse name: N/A  . Number of children: N/A  . Years of education: N/A   Social History Main Topics  . Smoking status: Former Smoker    Quit date: 01/31/1985  . Smokeless tobacco: Never Used     Comment: smoked  [place], up to 2.5 ppd  . Alcohol use No     Comment:  none since 2012  . Drug use: No  . Sexual activity: Not on file   Other Topics Concern  . Not on file   Social History Narrative   Exercises regularly - gym 5 days a week    Family History  Problem Relation Age of Onset  . Diabetes Mother   . Dementia Mother   . Diabetes Sister   . Lung cancer Unknown        maternal uncle & aunt  . Colon cancer Neg Hx   . Stomach cancer Neg Hx   .  Arthritis Neg Hx   . Heart disease Neg Hx   . Stroke Neg Hx     Review of Systems  Musculoskeletal: Positive for back pain, neck pain and neck stiffness.  Neurological: Negative for weakness and numbness.       Objective:   Vitals:   07/08/16 1130  BP: 114/68  Pulse: 77  Resp: 16  Temp: 98.1 F (36.7 C)   Filed Weights   07/08/16 1130  Weight: 186 lb (84.4 kg)   Body mass index is 25.23 kg/m.  Wt Readings from Last 3 Encounters:  07/08/16 186 lb (84.4 kg)  06/18/16 185 lb (83.9 kg)  05/17/16 190 lb (86.2 kg)     Physical Exam  Constitutional: He appears well-developed and well-nourished. No distress.  Musculoskeletal: He exhibits no edema.  No lumbar deformity  Neurological:  Normal sensation and strength in b/l LE, normal gait  Skin: Skin is warm and dry. He is not diaphoretic.          Assessment & Plan:   See Problem List for Assessment and Plan of chronic medical problems.

## 2016-07-08 ENCOUNTER — Encounter: Payer: Self-pay | Admitting: Internal Medicine

## 2016-07-08 ENCOUNTER — Ambulatory Visit (INDEPENDENT_AMBULATORY_CARE_PROVIDER_SITE_OTHER): Payer: Medicare Other | Admitting: Internal Medicine

## 2016-07-08 VITALS — BP 114/68 | HR 77 | Temp 98.1°F | Resp 16 | Wt 186.0 lb

## 2016-07-08 DIAGNOSIS — G8929 Other chronic pain: Secondary | ICD-10-CM | POA: Diagnosis not present

## 2016-07-08 DIAGNOSIS — M545 Low back pain: Secondary | ICD-10-CM | POA: Diagnosis not present

## 2016-07-08 NOTE — Patient Instructions (Signed)
A referral was ordered for orthopedics - Dr Lynann Bologna.

## 2016-07-08 NOTE — Assessment & Plan Note (Signed)
Continue yoga, back exercises Tylenol, aleve Would like to see ortho for further evaluation - does not want surgery and probably not injection Will refer to Dr Lynann Bologna

## 2016-07-11 DIAGNOSIS — R972 Elevated prostate specific antigen [PSA]: Secondary | ICD-10-CM | POA: Insufficient documentation

## 2016-07-11 DIAGNOSIS — R9721 Rising PSA following treatment for malignant neoplasm of prostate: Secondary | ICD-10-CM | POA: Diagnosis not present

## 2016-07-12 ENCOUNTER — Ambulatory Visit (INDEPENDENT_AMBULATORY_CARE_PROVIDER_SITE_OTHER): Payer: Medicare Other | Admitting: Family Medicine

## 2016-07-12 ENCOUNTER — Encounter: Payer: Self-pay | Admitting: Family Medicine

## 2016-07-12 DIAGNOSIS — M7551 Bursitis of right shoulder: Secondary | ICD-10-CM | POA: Diagnosis not present

## 2016-07-12 DIAGNOSIS — M545 Low back pain: Secondary | ICD-10-CM

## 2016-07-12 DIAGNOSIS — G8929 Other chronic pain: Secondary | ICD-10-CM

## 2016-07-12 NOTE — Progress Notes (Signed)
[facility] [name], [place] [address] Phone: [phone] Subjective:    I'm seeing this patient by the request  of:  Burns, Tom Lick, MD   CC: Shoulder issues.  [id number]  Tom Gaines is a 71 y.o. male coming in with complaint of shoulder pain issues. Patient is having bilateral shoulder pain and was found them more of a shoulder bursitis. Given home exercises, icing protocol and topical anti-inflammatories.. Feels like he was doing 70-80% better. Still some times with the right shoulder has some discomfort with certain movements. Very mild overall. No numbness, no radiation the pain. Thinks he is making improvement.   patient was also having back pain previously. Continues to have back pain. Feels like it is not making significant improvement but continues to not be debilitating. Patient denies any use radiation down the leg still. Patient does not want any surgical intervention and does not want any injections but does also not want any physical therapy.  Past Medical History:  Diagnosis Date  . Gout   . Prostate cancer ([place]) 1998   Past Surgical History:  Procedure Laterality Date  . CATARACT EXTRACTION, BILATERAL  2017  . colonoscopy with polypectomy  2014   Dr Fuller Plan  . LUMBAR DISC SURGERY  2002   Dr Ellene Route  . PROSTATECTOMY  1998  . SHOULDER ARTHROSCOPY     right; 2011; left 1990  . TONSILLECTOMY AND ADENOIDECTOMY     Social History   Social History  . Marital status: Married    Spouse name: Tom Gaines  . Number of children: Tom Gaines  . Years of education: Tom Gaines   Social History Main Topics  . Smoking status: Former Smoker    Quit date: 01/31/1985  . Smokeless tobacco: Never Used     Comment: smoked  [place], up to 2.5 ppd  . Alcohol use No     Comment:  none since 2012  . Drug use: No  . Sexual activity: Not Asked   Other Topics Concern  . None   Social History Narrative   Exercises regularly - gym 5 days a week    Allergies  Allergen Reactions  . 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 Diabetes Mother   . Dementia Mother   . Diabetes Sister   . Lung cancer Unknown        maternal uncle & aunt  . Colon cancer Neg Hx   . Stomach cancer Neg Hx   . Arthritis Neg Hx   . Heart disease Neg Hx   . Stroke Neg Hx     Past medical history, social, surgical and family history all reviewed in electronic medical record.  No pertanent information unless stated regarding to the chief complaint.   Review of Systems: No headache, visual changes, nausea, vomiting, diarrhea, constipation, dizziness, abdominal pain, skin rash, fevers, chills, night sweats, weight loss, swollen lymph nodes, body aches, joint swelling, muscle aches, chest pain, shortness of breath, mood changes.    Objective  Blood pressure 112/72, pulse 63, height 6' (1.829 m), SpO2 95 %. Systems examined below as of 07/12/16   Systems examined below as of 07/12/16 General: NAD A&O x3 mood, affect normal  HEENT: Pupils equal, extraocular movements intact no nystagmus Respiratory: not short of breath at rest or with speaking Cardiovascular: No lower  extremity edema, non tender Skin: Warm dry intact with no signs of infection or rash on extremities or on axial skeleton. Abdomen: Soft nontender, no masses Neuro: Cranial nerves  intact, neurovascularly intact in all extremities with 2+ DTRs and 2+ pulses. Lymph: No lymphadenopathy appreciated today  Gait normal with good balance and coordination.  MSK: Non tender with full range of motion and good stability and symmetric strength and tone of elbows, wrist,  knee hips and ankles bilaterally.   Shoulder: Bilateral Inspection reveals no abnormalities, atrophy or asymmetry. Palpation is normal with no tenderness over AC joint or bicipital groove. ROM is full in all planes. Rotator cuff  strength normal throughout. Mild impingement still remaining record and left Speeds and Yergason's tests normal. No labral pathology noted with negative Obrien's, negative clunk and good stability. Normal scapular function observed. No painful arc and no drop arm sign. No apprehension sign   Back Exam:  Inspection: Unremarkable  Motion: Flexion 40 deg, Extension 25 deg, Side Bending to 40 deg bilaterally,  Rotation to 40 deg bilaterally  SLR laying: Negative  XSLR laying: Negative  Palpable tenderness: Tender to palpation in the paraspinal musculature more left-sided than the right side.Marland Kitchen FABER: Moderate tenderness of the left side.. Sensory change: Gross sensation intact to all lumbar and sacral dermatomes.  Reflexes: 2+ at both patellar tendons, 2+ at achilles tendons, Babinski's downgoing.  Strength at foot  Plantar-flexion: 5/5 Dorsi-flexion: 5/5 Eversion: 5/5 Inversion: 5/5  Leg strength  Quad: 5/5 Hamstring: 5/5 Hip flexor: 5/5 Hip abductors: 5/5  Gait unremarkable.  Impression and Recommendations:     This case required medical decision making of moderate complexity.      Note: This dictation was prepared with Dragon dictation along with smaller phrase technology. Any transcriptional errors that result from this process are unintentional.

## 2016-07-12 NOTE — Patient Instructions (Signed)
Good to see you  The shoulder looks great  Continue the exercises 3 times a week if you can or at least 2 times a week for another 6 weeks.  Ice after activity  For the back  I will talk to Dr. Quay Burow and see what changes we can make.  I am consider physical therapy and gabapentin off the top of my head.  Can consider MRI as well or maybe labs See me again in 6 weeks if shoulder not resolved.

## 2016-07-12 NOTE — Assessment & Plan Note (Signed)
Patient continues to have back pain. Patient then x-rays have been independently visualized by me showing the patient is very mild osteophytic changes. Patient does have past medical history significant for prostate cancer as well as a basal cell carcinoma. We discussed with patient at great length about the potential of an MRI that this could lead to potential injections a could be beneficial for his back. Patient states that he does not know if he is willing to do that at this time. We also discussed the possibility of formal physical therapy which patient declined. Patient would like me to discuss this with primary care provider. He is considering a second opinion. Discuss that would be fine at this point and likely there is no surgical intervention necessary at this time. I do feel though with patient's history of cancers that advance imaging could be warranted.  Spent  25 minutes with patient face-to-face and had greater than 50% of counseling including as described above in assessment and plan which included wide twice a day of getting advance imaging in the history of cancer, possible interventions including epidural injections as well as formal physical therapy and even potentially surgery if necessary.Marland Kitchen

## 2016-07-12 NOTE — Assessment & Plan Note (Signed)
Improvement overall. Continue to monitor. We discussed icing regimen and home exercises. Encourage patient to continue this. Patient will come back again in 6 weeks for further evaluation.

## 2016-07-20 DIAGNOSIS — M5136 Other intervertebral disc degeneration, lumbar region: Secondary | ICD-10-CM | POA: Diagnosis not present

## 2016-07-31 ENCOUNTER — Other Ambulatory Visit: Payer: Self-pay | Admitting: Internal Medicine

## 2016-08-01 ENCOUNTER — Other Ambulatory Visit: Payer: Self-pay | Admitting: Internal Medicine

## 2016-08-10 ENCOUNTER — Encounter: Payer: Self-pay | Admitting: Internal Medicine

## 2016-08-23 ENCOUNTER — Encounter: Payer: Self-pay | Admitting: Family Medicine

## 2016-08-23 ENCOUNTER — Ambulatory Visit (INDEPENDENT_AMBULATORY_CARE_PROVIDER_SITE_OTHER): Payer: Medicare Other | Admitting: Family Medicine

## 2016-08-23 DIAGNOSIS — M7551 Bursitis of right shoulder: Secondary | ICD-10-CM

## 2016-08-23 MED ORDER — GABAPENTIN 100 MG PO CAPS: 200.0000 mg | ORAL_CAPSULE | Freq: Every day | ORAL | 3 refills | Status: DC

## 2016-08-23 NOTE — Assessment & Plan Note (Signed)
Discussed with patient again at great length. Discussed with patient again about the possibility of injection or formal physical therapy which patient declined. Started on gabapentin for more nighttime relief. Hopefully this will be beneficial. Patient did not make any improvement since previous exam. Discussed with patient about the possibility of further imaging if necessary. Patient will come back again in 6 weeks.

## 2016-08-23 NOTE — Progress Notes (Signed)
[name] [address] [place], [place] [address] Phone: [phone] Subjective:    I'm seeing this patient by the request  of:  Binnie Rail, MD   CC: Shoulder pain follow-up  [id number]  Tom Gaines is a 71 y.o. male coming in with complaint of shoulder pain issues. Done have more of a shoulder bursitis.Patient continues with conservative therapy. Does not want to do the physical therapy. Patient has been doing the home exercises but feels like he has plateaued. Most the pain only seems to be at night. Denies any significant neck pain. Patient states that if he can relieve some of the pressure at night he seems to do all right. Able to do daily activities without any significant pain.   patient was also having back pain previously seems stable at this time.  Past Medical History:  Diagnosis Date  . Gout   . Prostate cancer ([facility]) 1998   Past Surgical History:  Procedure Laterality Date  . CATARACT EXTRACTION, BILATERAL  2017  . colonoscopy with polypectomy  2014   Dr Fuller Plan  . LUMBAR DISC SURGERY  2002   Dr Ellene Route  . PROSTATECTOMY  1998  . SHOULDER ARTHROSCOPY     right; 2011; left 1990  . TONSILLECTOMY AND ADENOIDECTOMY     Social History   Social History  . Marital status: Married    Spouse name: N/A  . Number of children: N/A  . Years of education: N/A   Social History Main Topics  . Smoking status: Former Smoker    Quit date: 01/31/1985  . Smokeless tobacco: Never Used     Comment: smoked  [place], up to 2.5 ppd  . Alcohol use No     Comment:  none since 2012  . Drug use: No  . Sexual activity: Not Asked   Other Topics Concern  . None   Social History Narrative   Exercises regularly - gym 5 days a week   Allergies  Allergen Reactions  . 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 Diabetes Mother   .  Dementia Mother   . Diabetes Sister   . Lung cancer Unknown        maternal uncle & aunt  . Colon cancer Neg Hx   . Stomach cancer Neg Hx   . Arthritis Neg Hx   . Heart disease Neg Hx   . Stroke Neg Hx     Past medical history, social, surgical and family history all reviewed in electronic medical record.  No pertanent information unless stated regarding to the chief complaint.   Review of Systems: No headache, visual changes, nausea, vomiting, diarrhea, constipation, dizziness, abdominal pain, skin rash, fevers, chills, night sweats, weight loss, swollen lymph nodes, body aches, joint swelling, muscle aches, chest pain, shortness of breath, mood changes.   Positive muscle aches   Objective  Blood pressure 118/72, pulse 65, height 6' (1.829 m), weight 189 lb (85.7 kg), SpO2 96 %.   Systems examined below as of 08/23/16 General: NAD A&O x3 mood, affect normal  HEENT: Pupils equal, extraocular movements intact no nystagmus Respiratory: not short of breath at rest or with speaking Cardiovascular: No lower extremity edema, non tender Skin: Warm dry intact with no signs of infection or rash on extremities or on axial skeleton. Abdomen: Soft  nontender, no masses Neuro: Cranial nerves  intact, neurovascularly intact in all extremities with 2+ DTRs and 2+ pulses. Lymph: No lymphadenopathy appreciated today  Gait normal with good balance and coordination.  MSK: Non tender with full range of motion and good stability and symmetric strength and tone of  elbows, wrist,  knee hips and ankles bilaterally.   Shoulder:Bilateral Inspection reveals no abnormalities, atrophy or asymmetry. Palpation is normal with no tenderness over AC joint or bicipital groove. ROM is full in all planes. Rotator cuff strength normal throughout. Minimal impingement signs noted Speeds and Yergason's tests normal. No labral pathology noted with negative Obrien's, negative clunk and good stability. Normal scapular  function observed. No painful arc and no drop arm sign. No apprehension sign    Impression and Recommendations:     This case required medical decision making of moderate complexity.      Note: This dictation was prepared with Dragon dictation along with smaller phrase technology. Any transcriptional errors that result from this process are unintentional.

## 2016-08-23 NOTE — Patient Instructions (Signed)
Great to see you  The reason you are doing so well is all the work you have put into it.  One little change.  Gabapentin 100-200mg  at night to take the edge off.  May want to try 2 at night first  Keep trying the home exercises 3-5 times a week.  See me again in 6 weeks for [place] to check in

## 2016-10-04 ENCOUNTER — Ambulatory Visit: Payer: Medicare Other | Admitting: Family Medicine

## 2016-11-14 DIAGNOSIS — Z23 Encounter for immunization: Secondary | ICD-10-CM | POA: Diagnosis not present

## 2016-11-20 ENCOUNTER — Ambulatory Visit (HOSPITAL_COMMUNITY)
Admission: EM | Admit: 2016-11-20 | Discharge: 2016-11-20 | Disposition: A | Discharge status: Home / Self Care | Payer: Medicare Other | Attending: Internal Medicine | Admitting: Internal Medicine

## 2016-11-20 ENCOUNTER — Encounter (HOSPITAL_COMMUNITY): Payer: Self-pay | Admitting: Emergency Medicine

## 2016-11-20 DIAGNOSIS — R109 Unspecified abdominal pain: Secondary | ICD-10-CM | POA: Diagnosis not present

## 2016-11-20 DIAGNOSIS — R1032 Left lower quadrant pain: Secondary | ICD-10-CM

## 2016-11-20 LAB — POCT URINALYSIS DIP (DEVICE)
Bilirubin Urine: NEGATIVE
GLUCOSE, UA: NEGATIVE mg/dL
Hgb urine dipstick: NEGATIVE
Ketones, ur: NEGATIVE mg/dL
LEUKOCYTES UA: NEGATIVE
NITRITE: NEGATIVE
Protein, ur: NEGATIVE mg/dL
Specific Gravity, Urine: 1.02 (ref 1.005–1.030)
UROBILINOGEN UA: 0.2 mg/dL (ref 0.0–1.0)
pH: 5.5 (ref 5.0–8.0)

## 2016-11-20 NOTE — ED Triage Notes (Addendum)
Woke around 1:45 am Saturday morning.  Patient did have a BM.  Pain center abdomen to left side of abdomen.  Patient had 5 non-diarrhea stools.  No BM today.    Patient is getting ready to leave for [place] for his mothers funeral.  Patient wants to make sure this is nothing serious prior to leaving town  Feels better today, but wants to make sure this is just a virus and nothing more

## 2016-11-20 NOTE — Discharge Instructions (Signed)
Recommend continue to monitor symptoms. Does not appear to be a UTI or kidney stone at this time. May be gas. May take Mobic as needed for pain. Follow-up with your PCP in 4 to 5 days if symptoms do not improve or go to ER if symptoms worsen.

## 2016-11-21 NOTE — ED Provider Notes (Signed)
[facility]    CSN: [id number] Arrival date & time: 11/20/16  1300     History   Chief Complaint Chief Complaint  Patient presents with  . Abdominal Pain    HPI Tom Gaines is a 71 y.o. male.   71 year old male presents with abdominal pain and increased bowel movement frequency in the past 2 days. Woke up early yesterday morning with left sided abdominal pain and slight left flank pain. Went to the bathroom and strained to have a BM. Went back to sleep but woke up about an hour later with continued pain and experienced 5 regular bowel movements within 12 hours. No distinct diarrhea but last bowel movement was loose. No bowel movements today. Feels better today with less pain and still some soreness over left mid to lower abdomen. Took Mobic with some relief in pain yesterday. Denies any fever, nausea, vomiting, dysuria, hematuria or blood in his stool. No other family members ill. Planning to leave to go to [facility] for his Mom's funeral in 2 days. Wife thinks his symptoms may be due to stress. He does feel better now but wants to make sure he does not have a kidney stone or urinary tract infection. Does have history of prostate cancer and gout- on multiple medications, mainly supplements.    The history is provided by the patient.    Past Medical History:  Diagnosis Date  . Gout   . Prostate cancer [facility]) 1998    Patient Active Problem List   Diagnosis Date Noted  . Acute shoulder bursitis, right 05/17/2016  . Prediabetes 03/29/2016  . Nonallopathic lesion of lumbosacral region 12/01/2015  . Nonallopathic lesion of sacral region 12/01/2015  . Nonallopathic lesion of thoracic region 12/01/2015  . Chest wall discomfort 11/20/2015  . Low back pain 03/13/2014  . Basal cell carcinoma 02/13/2013  . Hyperlipidemia 03/12/2008  . History of gout 03/12/2008  . PROSTATE CANCER, HX OF 03/12/2008  . History of colonic polyps 03/12/2008    Past Surgical History:  Procedure  Laterality Date  . CATARACT EXTRACTION, BILATERAL  2017  . colonoscopy with polypectomy  2014   Dr Fuller Plan  . LUMBAR DISC SURGERY  2002   Dr Ellene Route  . PROSTATECTOMY  1998  . SHOULDER ARTHROSCOPY     right; 2011; left 1990  . TONSILLECTOMY AND ADENOIDECTOMY         Home Medications    Prior to Admission medications   Medication Sig Start Date End Date Taking? Authorizing Provider  acetaminophen (TYLENOL) 500 MG tablet Take 500 mg by mouth every 6 (six) hours as needed.    [provider]  acyclovir (ZOVIRAX) 800 MG tablet Take 400 mg by mouth 2 (two) times daily.  11/09/11   [provider]  allopurinol (ZYLOPRIM) 300 MG tablet TAKE 1 TABLET BY MOUTH  DAILY 08/01/16   Binnie Rail, MD  Ascorbic Acid (VITAMIN C) 100 MG tablet Take 100 mg by mouth daily.    [provider]  aspirin 81 MG tablet Take 81 mg by mouth daily.      [provider]  cetirizine (ZYRTEC) 10 MG tablet Take 10 mg by mouth as needed.     [provider]  Cholecalciferol (VITAMIN D) 2000 units CAPS Take 1 capsule by mouth daily.    [provider]  CINNAMON PO Take 500 mg by mouth daily.    [provider]  fenofibrate (TRICOR) 145 MG tablet TAKE 1 TABLET  BY MOUTH  DAILY 04/18/16   Binnie Rail, MD  fluticasone (FLONASE) 50 MCG/ACT nasal spray USE 1 SPRAY NASALLY TWICE A DAY AS NEEDED 08/01/16   Binnie Rail, MD  gabapentin (NEURONTIN) 100 MG capsule Take 2 capsules (200 mg total) by mouth at bedtime. 08/23/16   Lyndal Pulley, DO  meloxicam (MOBIC) 15 MG tablet Take 15 mg by mouth daily.    [provider]  Misc Natural Products (TART CHERRY ADVANCED PO) Take 1 capsule by mouth daily.    [provider]  Multiple Vitamin (MULTIVITAMIN) tablet Take 1 tablet by mouth daily.      [provider]  naproxen sodium (ALEVE) 220 MG tablet Take 220 mg by mouth as needed.      [provider]  Omega-3 Fatty Acids (FISH OIL)  1000 MG CAPS Take 1,000 mg by mouth. 1 by mouth daily    [provider]  Turmeric Curcumin 500 MG CAPS Take 500 mg by mouth 2 (two) times daily.    [provider]    Family History Family History  Problem Relation Age of Onset  . Diabetes Mother   . Dementia Mother   . Diabetes Sister   . Lung cancer Unknown        maternal uncle & aunt  . Colon cancer Neg Hx   . Stomach cancer Neg Hx   . Arthritis Neg Hx   . Heart disease Neg Hx   . Stroke Neg Hx     Social History Social History  Substance Use Topics  . Smoking status: Former Smoker    Quit date: 01/31/1985  . Smokeless tobacco: Never Used     Comment: smoked  [place], up to 2.5 ppd  . Alcohol use No     Comment:  none since 2012     Allergies   Penicillins   Review of Systems Review of Systems  Constitutional: Negative for activity change, appetite change, chills, fatigue and fever.  HENT: Negative for mouth sores, nosebleeds, sore throat and trouble swallowing.   Respiratory: Negative for chest tightness, shortness of breath and wheezing.   Cardiovascular: Negative for chest pain and palpitations.  Gastrointestinal: Positive for abdominal pain. Negative for anal bleeding, blood in stool, constipation, nausea and vomiting. Diarrhea: loose stools.  Genitourinary: Negative for decreased urine volume, difficulty urinating, discharge, dysuria, flank pain, frequency, hematuria, penile pain and urgency.  Musculoskeletal: Negative for arthralgias, back pain and myalgias.  Skin: Negative for color change, rash and wound.  Neurological: Negative for dizziness, tremors, syncope, weakness, light-headedness, numbness and headaches.  Hematological: Negative for adenopathy. Does not bruise/bleed easily.  Psychiatric/Behavioral: Negative.      Physical Exam Triage Vital Signs ED Triage Vitals  Enc Vitals Group     BP 11/20/16 1409 127/73     Pulse Rate 11/20/16 1409 77     Resp 11/20/16 1409 18      Temp 11/20/16 1409 97.7 F (36.5 C)     Temp Source 11/20/16 1409 Oral     SpO2 11/20/16 1409 98 %     Weight --      Height --      Head Circumference --      Peak Flow --      Pain Score 11/20/16 1407 2     Pain Loc --      Pain Edu? --      Excl. in [place]? --    No data found.  Updated Vital Signs BP 127/73 (BP Location: Right Arm)   Pulse 77   Temp 97.7 F (36.5 C) (Oral)   Resp 18   SpO2 98%   Visual Acuity Right Eye Distance:   Left Eye Distance:   Bilateral Distance:    Right Eye Near:   Left Eye Near:    Bilateral Near:     Physical Exam  Constitutional: He is oriented to person, place, and time. He appears well-developed and well-nourished. No distress.  HENT:  Head: Normocephalic and atraumatic.  Right Ear: External ear normal.  Left Ear: External ear normal.  Mouth/Throat: Oropharynx is clear and moist.  Eyes: Conjunctivae and EOM are normal.  Neck: Normal range of motion.  Cardiovascular: Normal rate, regular rhythm and normal heart sounds.   No murmur heard. Pulmonary/Chest: Effort normal and breath sounds normal. No respiratory distress. He has no wheezes.  Abdominal: Soft. Normal appearance and bowel sounds are normal. There is no hepatosplenomegaly. There is tenderness in the left upper quadrant and left lower quadrant. There is no rigidity, no rebound, no guarding and no CVA tenderness.    Musculoskeletal: Normal range of motion.  Neurological: He is alert and oriented to person, place, and time.  Skin: Skin is warm and dry.  Psychiatric: He has a normal mood and affect. His behavior is normal. Judgment and thought content normal.     UC Treatments / Results  Labs (all labs ordered are listed, but only abnormal results are displayed) Labs Reviewed  POCT URINALYSIS DIP (DEVICE)    EKG  EKG Interpretation None       Radiology No results found.  Procedures Procedures (including critical care time)  Medications Ordered in  UC Medications - No data to display   Initial Impression / Assessment and Plan / UC Course  I have reviewed the triage vital signs and the nursing notes.  Pertinent labs & imaging results that were available during my care of the patient were reviewed by me and considered in my medical decision making (see chart for details).    Reviewed negative urinalysis results with patient- no distinct findings for a renal calculi or UTI. Discussed that it may be a viral illness, gas  or stress causing his symptoms. Recommend continue to monitor. May take Mobic as needed for pain. Recommend follow-up with his PCP in 4 to 5 days if symptoms do not improve or go to ER if symptoms worsen.    Final Clinical Impressions(s) / UC Diagnoses   Final diagnoses:  Abdominal pain, left lower quadrant  Acute left flank pain    New Prescriptions Discharge Medication List as of 11/20/2016  2:54 PM       Controlled Substance Prescriptions [place] Controlled Substance Registry consulted? Not Applicable   Katy Apo, NP 11/21/16 1249

## 2016-11-28 ENCOUNTER — Other Ambulatory Visit: Payer: Self-pay | Admitting: *Deleted

## 2016-11-28 MED ORDER — GABAPENTIN 100 MG PO CAPS: 200.0000 mg | ORAL_CAPSULE | Freq: Every day | ORAL | 0 refills | Status: DC

## 2016-11-28 NOTE — Telephone Encounter (Signed)
Refill request for gabapentin 100mg  sent to pharmacy.

## 2017-01-03 DIAGNOSIS — M5136 Other intervertebral disc degeneration, lumbar region: Secondary | ICD-10-CM | POA: Diagnosis not present

## 2017-01-04 DIAGNOSIS — L821 Other seborrheic keratosis: Secondary | ICD-10-CM | POA: Diagnosis not present

## 2017-01-04 DIAGNOSIS — D2362 Other benign neoplasm of skin of left upper limb, including shoulder: Secondary | ICD-10-CM | POA: Diagnosis not present

## 2017-01-04 DIAGNOSIS — L570 Actinic keratosis: Secondary | ICD-10-CM | POA: Diagnosis not present

## 2017-01-04 DIAGNOSIS — L218 Other seborrheic dermatitis: Secondary | ICD-10-CM | POA: Diagnosis not present

## 2017-01-04 DIAGNOSIS — D1801 Hemangioma of skin and subcutaneous tissue: Secondary | ICD-10-CM | POA: Diagnosis not present

## 2017-01-09 ENCOUNTER — Ambulatory Visit: Payer: Medicare Other | Admitting: Family Medicine

## 2017-01-10 DIAGNOSIS — M5136 Other intervertebral disc degeneration, lumbar region: Secondary | ICD-10-CM | POA: Diagnosis not present

## 2017-01-10 DIAGNOSIS — M545 Low back pain: Secondary | ICD-10-CM | POA: Diagnosis not present

## 2017-01-11 DIAGNOSIS — C61 Malignant neoplasm of prostate: Secondary | ICD-10-CM | POA: Diagnosis not present

## 2017-01-11 DIAGNOSIS — R972 Elevated prostate specific antigen [PSA]: Secondary | ICD-10-CM | POA: Diagnosis not present

## 2017-01-13 ENCOUNTER — Ambulatory Visit: Payer: Medicare Other | Admitting: Family Medicine

## 2017-01-16 DIAGNOSIS — N528 Other male erectile dysfunction: Secondary | ICD-10-CM | POA: Diagnosis not present

## 2017-01-16 DIAGNOSIS — R9721 Rising PSA following treatment for malignant neoplasm of prostate: Secondary | ICD-10-CM | POA: Diagnosis not present

## 2017-01-31 NOTE — Progress Notes (Signed)
[name] [address] [place], [place] [address] Phone: [phone] Subjective:      CC: Back pain follow-up, shoulder pain follow-up  [id number]  Tom Gaines is a 72 y.o. male coming in with complaint of low back pain.  Patient elected to try conservative therapy including allopurinol, gabapentin, meloxicam and osteopathic manipulation.  Patient was given home exercises.  Patient states states that he is here for his shoulders today.  Found to have bilateral shoulder pain but right greater than left.  Found to have a shoulder bursitis.  Patient was also to do home exercises and icing regimen.  Patient states that he has not made any progress. He did do the exercises and tried some other exercises which have not helped. He still has pain with reaching overhead. The left is worse today than the right but states that some days his right may be worse than the left.       Past Medical History:  Diagnosis Date  . Gout   . Prostate cancer ([place]) 1998   Past Surgical History:  Procedure Laterality Date  . CATARACT EXTRACTION, BILATERAL  2017  . colonoscopy with polypectomy  2014   Dr Fuller Plan  . LUMBAR DISC SURGERY  2002   Dr Ellene Route  . PROSTATECTOMY  1998  . SHOULDER ARTHROSCOPY     right; 2011; left 1990  . TONSILLECTOMY AND ADENOIDECTOMY     Social History   Socioeconomic History  . Marital status: Married    Spouse name: None  . Number of children: None  . Years of education: None  . Highest education level: None  Social Needs  . Financial resource strain: None  . Food insecurity - worry: None  . Food insecurity - inability: None  . Transportation needs - medical: None  . Transportation needs - non-medical: None  Occupational History  . None  Tobacco Use  . Smoking status: Former Smoker    Last attempt to quit: 01/31/1985    Years since quitting: 32.0  . Smokeless tobacco: Never Used  . Tobacco comment: smoked  [place], up to  2.5 ppd  Substance and Sexual Activity  . Alcohol use: No    Comment:  none since 2012  . Drug use: No  . Sexual activity: None  Other Topics Concern  . None  Social History Narrative   Exercises regularly - gym 5 days a week   Allergies  Allergen Reactions  . 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 Diabetes Mother   . Dementia Mother   . Diabetes Sister   . Lung cancer Unknown        maternal uncle & aunt  . Colon cancer Neg Hx   . Stomach cancer Neg Hx   . Arthritis Neg Hx   . Heart disease Neg Hx   . Stroke Neg Hx      Past medical history, social, surgical and family history all reviewed in electronic medical record.  No pertanent information unless stated regarding to the chief complaint.   Review of Systems:Review of systems updated and as accurate as of 02/01/17  No headache, visual changes, nausea, vomiting, diarrhea, constipation, dizziness, abdominal pain, skin rash, fevers, chills, night sweats, weight loss, swollen lymph nodes, body aches, joint swelling, muscle aches, chest pain, shortness of breath, mood changes.  Objective  Blood pressure 120/74, pulse 66, height 6' (1.829 m), weight 188 lb (85.3 kg), SpO2 96 %. Systems examined below as of 02/01/17   General: No apparent distress alert and oriented x3 mood and affect normal, dressed appropriately.  HEENT: Pupils equal, extraocular movements intact  Respiratory: Patient's speak in full sentences and does not appear short of breath  Cardiovascular: No lower extremity edema, non tender, no erythema  Skin: Warm dry intact with no signs of infection or rash on extremities or on axial skeleton.  Abdomen: Soft nontender  Neuro: Cranial nerves II through XII are intact, neurovascularly intact in all extremities with 2+ DTRs and 2+ pulses.  Lymph: No lymphadenopathy of posterior or anterior cervical chain or  axillae bilaterally.  Gait normal with good balance and coordination.  MSK:  Non tender with full range of motion and good stability and symmetric strength and tone of elbows, wrist, hip, knee and ankles bilaterally.  Shoulder: Right Inspection reveals no abnormalities, atrophy or asymmetry. Palpation is normal with no tenderness over AC joint or bicipital groove. ROM is full in all planes passively. Rotator cuff strength normal throughout. signs of impingement with positive Neer and Hawkin's tests, but negative empty can sign. Speeds and Yergason's tests normal. No labral pathology noted with negative Obrien's, negative clunk and good stability. Normal scapular function observed. No painful arc and no drop arm sign. No apprehension sign  Procedure: Real-time Ultrasound Guided Injection of left glenohumeral joint Device: GE Logiq E  Ultrasound guided injection is preferred based studies that show increased duration, increased effect, greater accuracy, decreased procedural pain, increased response rate with ultrasound guided versus blind injection.  Verbal informed consent obtained.  Time-out conducted.  Noted no overlying erythema, induration, or other signs of local infection.  Skin prepped in a sterile fashion.  Local anesthesia: Topical Ethyl chloride.  With sterile technique and under real time ultrasound guidance:  Joint visualized.  21g 2 inch needle inserted posterior approach. Pictures taken for needle placement. Patient did have injection of 2 cc of 0.5% Marcaine, and 1cc of Kenalog 40 mg/dL. Completed without difficulty  Pain immediately resolved suggesting accurate placement of the medication.  Advised to call if fevers/chills, erythema, induration, drainage, or persistent bleeding.  Images permanently stored and available for review in the ultrasound unit.  Impression: Technically successful ultrasound guided injection.  Procedure: Real-time Ultrasound Guided Injection of  right glenohumeral joint Device: GE Logiq E  Ultrasound guided injection is preferred based studies that show increased duration, increased effect, greater accuracy, decreased procedural pain, increased response rate with ultrasound guided versus blind injection.  Verbal informed consent obtained.  Time-out conducted.  Noted no overlying erythema, induration, or other signs of local infection.  Skin prepped in a sterile fashion.  Local anesthesia: Topical Ethyl chloride.  With sterile technique and under real time ultrasound guidance:  Joint visualized.  23g 1  inch needle inserted posterior approach. Pictures taken for needle placement. Patient did have injection of 2 cc of 1% lidocaine, 2 cc of 0.5% Marcaine, and 1.0 cc of Kenalog 40 mg/dL. Completed without difficulty  Pain immediately resolved suggesting accurate placement of the medication.  Advised to call if fevers/chills, erythema, induration, drainage, or persistent bleeding.  Images permanently stored and available for review in the ultrasound unit.  Impression: Technically successful ultrasound guided injection.    Impression and Recommendations:     This case required medical decision making of moderate complexity.      Note: This dictation was  prepared with Dragon dictation along with smaller phrase technology. Any transcriptional errors that result from this process are unintentional.

## 2017-02-01 ENCOUNTER — Ambulatory Visit (INDEPENDENT_AMBULATORY_CARE_PROVIDER_SITE_OTHER): Payer: Medicare Other | Admitting: Family Medicine

## 2017-02-01 ENCOUNTER — Encounter: Payer: Self-pay | Admitting: Family Medicine

## 2017-02-01 ENCOUNTER — Ambulatory Visit: Payer: Self-pay

## 2017-02-01 VITALS — BP 120/74 | HR 66 | Ht 72.0 in | Wt 188.0 lb

## 2017-02-01 DIAGNOSIS — M7551 Bursitis of right shoulder: Secondary | ICD-10-CM

## 2017-02-01 DIAGNOSIS — M25511 Pain in right shoulder: Secondary | ICD-10-CM

## 2017-02-01 DIAGNOSIS — M7552 Bursitis of left shoulder: Secondary | ICD-10-CM | POA: Diagnosis not present

## 2017-02-01 DIAGNOSIS — M25512 Pain in left shoulder: Secondary | ICD-10-CM | POA: Diagnosis not present

## 2017-02-01 DIAGNOSIS — G8929 Other chronic pain: Secondary | ICD-10-CM

## 2017-02-01 DIAGNOSIS — M79675 Pain in left toe(s): Secondary | ICD-10-CM | POA: Diagnosis not present

## 2017-02-01 DIAGNOSIS — L03032 Cellulitis of left toe: Secondary | ICD-10-CM | POA: Diagnosis not present

## 2017-02-01 DIAGNOSIS — L02612 Cutaneous abscess of left foot: Secondary | ICD-10-CM | POA: Diagnosis not present

## 2017-02-01 HISTORY — DX: Bursitis of right shoulder: M75.51

## 2017-02-01 NOTE — Patient Instructions (Signed)
Good to see you  OK to start the exercises in 2 days agine If not a lot better within days we will need to look at your neck  Ice 20 minutes 2 times daily. Usually after activity and before bed. See me again in 3 weeks to make sure doing well  Happy New Year!

## 2017-02-01 NOTE — Assessment & Plan Note (Signed)
Bilateral injections given today.  Tolerated the procedure well.  We discussed icing regimen and home exercises.  We discussed which activities of doing which wants to avoid.  Patient will increase activity slowly over the course the next several days.  Follow-up with me again 4 weeks

## 2017-02-03 ENCOUNTER — Other Ambulatory Visit: Payer: Self-pay | Admitting: Family Medicine

## 2017-02-15 DIAGNOSIS — L03032 Cellulitis of left toe: Secondary | ICD-10-CM | POA: Diagnosis not present

## 2017-02-22 ENCOUNTER — Ambulatory Visit (INDEPENDENT_AMBULATORY_CARE_PROVIDER_SITE_OTHER): Payer: Medicare Other | Admitting: Family Medicine

## 2017-02-22 ENCOUNTER — Encounter: Payer: Self-pay | Admitting: Family Medicine

## 2017-02-22 DIAGNOSIS — M7552 Bursitis of left shoulder: Secondary | ICD-10-CM

## 2017-02-22 DIAGNOSIS — M7551 Bursitis of right shoulder: Secondary | ICD-10-CM | POA: Diagnosis not present

## 2017-02-22 NOTE — Patient Instructions (Signed)
Good to see you  Keep hands within peripheral vision at all times.  Ice after activity  Gabapentin go down to 100mg  nightly for 1 week and then if no difference then stop it.  If worsening pain then increase again DHEA 50 mg daily for 4 weeks to help with the fatigue.  See em again in 2 months

## 2017-02-22 NOTE — Progress Notes (Signed)
[facility] [place] [address] [place], [place] [address] Phone: [phone] Subjective:    I'm seeing this patient by the request  of:    CC: Low back pain follow-up  [id number]  Tom Gaines is a 72 y.o. male coming in with complaint of low back pain.  Has responded fairly well to conservative therapy.  Recently the patient is also had bilateral shoulder pain.  Given injections February 01, 2017 for subacromial bursitis.  Patient was to do home exercises and icing regimen.  Patient states that he is doing much better. He wants to see if he can do anything to get rid of the pain completely as he does have twinges with his arms overhead.       Past Medical History:  Diagnosis Date  . Gout   . Prostate cancer ([place]) 1998   Past Surgical History:  Procedure Laterality Date  . CATARACT EXTRACTION, BILATERAL  2017  . colonoscopy with polypectomy  2014   Dr Fuller Plan  . LUMBAR DISC SURGERY  2002   Dr Ellene Route  . PROSTATECTOMY  1998  . SHOULDER ARTHROSCOPY     right; 2011; left 1990  . TONSILLECTOMY AND ADENOIDECTOMY     Social History   Socioeconomic History  . Marital status: Married    Spouse name: None  . Number of children: None  . Years of education: None  . Highest education level: None  Social Needs  . Financial resource strain: None  . Food insecurity - worry: None  . Food insecurity - inability: None  . Transportation needs - medical: None  . Transportation needs - non-medical: None  Occupational History  . None  Tobacco Use  . Smoking status: Former Smoker    Last attempt to quit: 01/31/1985    Years since quitting: 32.0  . Smokeless tobacco: Never Used  . Tobacco comment: smoked  [place], up to 2.5 ppd  Substance and Sexual Activity  . Alcohol use: No    Comment:  none since 2012  . Drug use: No  . Sexual activity: None  Other Topics Concern  . None  Social History Narrative   Exercises regularly - gym 5 days a week    Allergies  Allergen Reactions  . 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 Diabetes Mother   . Dementia Mother   . Diabetes Sister   . Lung cancer Unknown        maternal uncle & aunt  . Colon cancer Neg Hx   . Stomach cancer Neg Hx   . Arthritis Neg Hx   . Heart disease Neg Hx   . Stroke Neg Hx      Past medical history, social, surgical and family history all reviewed in electronic medical record.  No pertanent information unless stated regarding to the chief complaint.   Review of Systems:Review of systems updated and as accurate as of 02/22/17  No headache, visual changes, nausea, vomiting, diarrhea, constipation, dizziness, abdominal pain, skin rash, fevers, chills, night sweats, weight loss, swollen lymph nodes, body aches, joint swelling, , chest pain, shortness of breath, mood changes.  Positive muscle aches  Objective  Blood pressure 94/62, pulse 94, height 6' (1.829 m), weight 187 lb (84.8 kg), SpO2 95 %. Systems examined below as of 02/22/17   General: No apparent distress  alert and oriented x3 mood and affect normal, dressed appropriately.  HEENT: Pupils equal, extraocular movements intact  Respiratory: Patient's speak in full sentences and does not appear short of breath  Cardiovascular: No lower extremity edema, non tender, no erythema  Skin: Warm dry intact with no signs of infection or rash on extremities or on axial skeleton.  Abdomen: Soft nontender  Neuro: Cranial nerves II through XII are intact, neurovascularly intact in all extremities with 2+ DTRs and 2+ pulses.  Lymph: No lymphadenopathy of posterior or anterior cervical chain or axillae bilaterally.  Gait normal with good balance and coordination.  MSK:  Non tender with full range of motion and good stability and symmetric strength and tone of  elbows, wrist, hip, knee and ankles bilaterally.   Shoulder: Bilateral Inspection reveals no abnormalities, atrophy or asymmetry. Palpation is normal with no tenderness over AC joint or bicipital groove. ROM is full in all planes. Rotator cuff strength normal throughout. Mild impingement noted Speeds and Yergason's tests normal. Mild positive O'Brien's right greater than left Normal scapular function observed. No painful arc and no drop arm sign. No apprehension sign        Impression and Recommendations:     This case required medical decision making of moderate complexity.      Note: This dictation was prepared with Dragon dictation along with smaller phrase technology. Any transcriptional errors that result from this process are unintentional.

## 2017-02-22 NOTE — Assessment & Plan Note (Signed)
Patient feels that the injections helped out significantly.  Patient wants to get off the gabapentin we discussed the proper interpretation.  We discussed keeping hands within peripheral vision..  Follow-up again 2 months

## 2017-02-24 ENCOUNTER — Encounter: Payer: Self-pay | Admitting: Family Medicine

## 2017-02-24 ENCOUNTER — Other Ambulatory Visit: Payer: Self-pay | Admitting: Internal Medicine

## 2017-03-21 ENCOUNTER — Encounter: Payer: Self-pay | Admitting: Internal Medicine

## 2017-03-21 DIAGNOSIS — E7849 Other hyperlipidemia: Secondary | ICD-10-CM

## 2017-03-21 DIAGNOSIS — R7303 Prediabetes: Secondary | ICD-10-CM

## 2017-03-21 DIAGNOSIS — Z8739 Personal history of other diseases of the musculoskeletal system and connective tissue: Secondary | ICD-10-CM

## 2017-03-27 ENCOUNTER — Other Ambulatory Visit (INDEPENDENT_AMBULATORY_CARE_PROVIDER_SITE_OTHER): Payer: Medicare Other

## 2017-03-27 DIAGNOSIS — R7303 Prediabetes: Secondary | ICD-10-CM | POA: Diagnosis not present

## 2017-03-27 DIAGNOSIS — E7849 Other hyperlipidemia: Secondary | ICD-10-CM | POA: Diagnosis not present

## 2017-03-27 DIAGNOSIS — Z8739 Personal history of other diseases of the musculoskeletal system and connective tissue: Secondary | ICD-10-CM

## 2017-03-27 LAB — LIPID PANEL
CHOL/HDL RATIO: 3
Cholesterol: 182 mg/dL (ref 0–200)
HDL: 62.6 mg/dL (ref >39.00)
LDL CALC: 108 mg/dL — AB (ref 0–99)
NONHDL: 119.58
Triglycerides: 60 mg/dL (ref 0.0–149.0)
VLDL: 12 mg/dL (ref 0.0–40.0)

## 2017-03-27 LAB — CBC WITH DIFFERENTIAL/PLATELET
BASOS PCT: 0.6 % (ref 0.0–3.0)
Basophils Absolute: 0 10*3/uL (ref 0.0–0.1)
EOS ABS: 0.1 10*3/uL (ref 0.0–0.7)
EOS PCT: 2.5 % (ref 0.0–5.0)
HCT: 44.7 % (ref 39.0–52.0)
Hemoglobin: 15.2 g/dL (ref 13.0–17.0)
LYMPHS ABS: 1.5 10*3/uL (ref 0.7–4.0)
Lymphocytes Relative: 24.4 % (ref 12.0–46.0)
MCHC: 34.1 g/dL (ref 30.0–36.0)
MCV: 94.4 fl (ref 78.0–100.0)
MONO ABS: 0.7 10*3/uL (ref 0.1–1.0)
Monocytes Relative: 11 % (ref 3.0–12.0)
NEUTROS PCT: 61.5 % (ref 43.0–77.0)
Neutro Abs: 3.7 10*3/uL (ref 1.4–7.7)
Platelets: 204 10*3/uL (ref 150.0–400.0)
RBC: 4.73 Mil/uL (ref 4.22–5.81)
RDW: 16.2 % — AB (ref 11.5–15.5)
WBC: 6 10*3/uL (ref 4.0–10.5)

## 2017-03-27 LAB — COMPREHENSIVE METABOLIC PANEL
ALT: 23 U/L (ref 0–53)
AST: 32 U/L (ref 0–37)
Albumin: 4.4 g/dL (ref 3.5–5.2)
Alkaline Phosphatase: 38 U/L — ABNORMAL LOW (ref 39–117)
BUN: 29 mg/dL — AB (ref 6–23)
CHLORIDE: 105 meq/L (ref 96–112)
CO2: 27 meq/L (ref 19–32)
Calcium: 10.2 mg/dL (ref 8.4–10.5)
Creatinine, Ser: 1.73 mg/dL — ABNORMAL HIGH (ref 0.40–1.50)
GFR: 41.48 mL/min — ABNORMAL LOW (ref >60.00)
GLUCOSE: 85 mg/dL (ref 70–99)
POTASSIUM: 4.7 meq/L (ref 3.5–5.1)
SODIUM: 143 meq/L (ref 135–145)
Total Bilirubin: 0.5 mg/dL (ref 0.2–1.2)
Total Protein: 7.2 g/dL (ref 6.0–8.3)

## 2017-03-27 LAB — HEMOGLOBIN A1C: Hgb A1c MFr Bld: 5.9 % (ref 4.6–6.5)

## 2017-03-27 LAB — TSH: TSH: 1.2 u[IU]/mL (ref 0.35–4.50)

## 2017-03-27 LAB — URIC ACID: Uric Acid, Serum: 5 mg/dL (ref 4.0–7.8)

## 2017-03-30 NOTE — Progress Notes (Signed)
Subjective:    Patient ID: Tom Gaines, male    DOB: [birth year], 72 y.o.   MRN: [id number]  HPI The patient is here for follow up.  Prediabetes:  He is fairly compliant with a low sugar/carbohydrate diet.  He does not eat many sugars, but may be able to improve his carbohydrate intake he is exercising regularly - 5/week.  Hyperlipidemia: He is taking his medication daily. He is compliant with a low fat/cholesterol diet. He is exercising regularly. He denies myalgias.   H/o gout:  He is taking allopurinol 300 mg daily.  He has not had any gout symptoms since he was here last.  CKD: He has been taking more anti-inflammatories over the past year because of back pain.  He is currently taking 2 Aleve twice daily.  He does consume a good amount of fluids throughout the day-mostly herbal tea.  He drinks 1 cup of coffee.  Occasional difficulty swallowing:  He thinks it is not related to anything he has eaten in particular.  It has not gotten worse.  It feels like it is in the throat.  Not in the lower chest.  He is tired.  He sleeps 8-9 hours of sleep and feel refreshed.  He feels good until 3 or 4 in the afternoon.  He struggles through it.  He does not nap.  The rest of the evening and night he feels well.  Medications and allergies reviewed with patient and updated if appropriate.  Patient Active Problem List   Diagnosis Date Noted  . Bilateral shoulder bursitis 02/01/2017  . Acute shoulder bursitis, right 05/17/2016  . Prediabetes 03/29/2016  . Nonallopathic lesion of lumbosacral region 12/01/2015  . Nonallopathic lesion of sacral region 12/01/2015  . Nonallopathic lesion of thoracic region 12/01/2015  . Chest wall discomfort 11/20/2015  . Low back pain 03/13/2014  . Basal cell carcinoma 02/13/2013  . Hyperlipidemia 03/12/2008  . History of gout 03/12/2008  . PROSTATE CANCER, HX OF 03/12/2008  . History of colonic polyps 03/12/2008    Current Outpatient Medications on File  Prior to Visit  Medication Sig Dispense Refill  . acetaminophen (TYLENOL) 500 MG tablet Take 500 mg by mouth every 6 (six) hours as needed.    Marland Kitchen acyclovir (ZOVIRAX) 800 MG tablet Take 400 mg by mouth 2 (two) times daily.     Marland Kitchen allopurinol (ZYLOPRIM) 300 MG tablet TAKE 1 TABLET BY MOUTH  DAILY 90 tablet 0  . Ascorbic Acid (VITAMIN C) 100 MG tablet Take 100 mg by mouth daily.    Marland Kitchen aspirin 81 MG tablet Take 81 mg by mouth 2 (two) times daily.     . cetirizine (ZYRTEC) 10 MG tablet Take 10 mg by mouth as needed.     . Cholecalciferol (VITAMIN D) 2000 units CAPS Take 1 capsule by mouth daily.    . fenofibrate (TRICOR) 145 MG tablet TAKE 1 TABLET BY MOUTH  DAILY 90 tablet 0  . fluticasone (FLONASE) 50 MCG/ACT nasal spray USE 1 SPRAY NASALLY TWICE A DAY AS NEEDED 48 g 1  . Folic Acid-Vit V7-CHY I50 (FA-VITAMIN B-6-VITAMIN B-12 PO) Take by mouth daily.    . Misc Natural Products (TART CHERRY ADVANCED PO) Take 1 capsule by mouth daily.    . Multiple Vitamin (MULTIVITAMIN) tablet Take 1 tablet by mouth daily.      . naproxen sodium (ALEVE) 220 MG tablet Take 220 mg by mouth as needed.      . Omega-3 Fatty Acids (  FISH OIL) 1000 MG CAPS Take 1,000 mg by mouth. 1 by mouth daily    . Turmeric Curcumin 500 MG CAPS Take 500 mg by mouth 3 (three) times daily.      No current facility-administered medications on file prior to visit.     Past Medical History:  Diagnosis Date  . Gout   . Prostate cancer ([place]) 1998    Past Surgical History:  Procedure Laterality Date  . CATARACT EXTRACTION, BILATERAL  2017  . colonoscopy with polypectomy  2014   Dr Fuller Plan  . LUMBAR DISC SURGERY  2002   Dr Ellene Route  . PROSTATECTOMY  1998  . SHOULDER ARTHROSCOPY     right; 2011; left 1990  . TONSILLECTOMY AND ADENOIDECTOMY      Social History   Socioeconomic History  . Marital status: Married    Spouse name: None  . Number of children: None  . Years of education: None  . Highest education level: None  Social  Needs  . Financial resource strain: None  . Food insecurity - worry: None  . Food insecurity - inability: None  . Transportation needs - medical: None  . Transportation needs - non-medical: None  Occupational History  . None  Tobacco Use  . Smoking status: Former Smoker    Last attempt to quit: 01/31/1985    Years since quitting: 32.1  . Smokeless tobacco: Never Used  . Tobacco comment: smoked  [place], up to 2.5 ppd  Substance and Sexual Activity  . Alcohol use: No    Comment:  none since 2012  . Drug use: No  . Sexual activity: None  Other Topics Concern  . None  Social History Narrative   Exercises regularly - gym 5 days a week    Family History  Problem Relation Age of Onset  . Diabetes Mother   . Dementia Mother   . Diabetes Sister   . Lung cancer Unknown        maternal uncle & aunt  . Colon cancer Neg Hx   . Stomach cancer Neg Hx   . Arthritis Neg Hx   . Heart disease Neg Hx   . Stroke Neg Hx     Review of Systems  Constitutional: Negative for chills and fever.  Respiratory: Negative for cough, shortness of breath and wheezing.   Cardiovascular: Negative for chest pain, palpitations and leg swelling.  Gastrointestinal: Negative for abdominal pain and nausea.       No gerd  Neurological: Negative for light-headedness and headaches.       Objective:   Vitals:   03/31/17 1515  BP: 124/78  Pulse: 84  Resp: 16  Temp: (!) 97.5 F (36.4 C)  SpO2: 95%   Wt Readings from Last 3 Encounters:  03/31/17 187 lb (84.8 kg)  02/22/17 187 lb (84.8 kg)  02/01/17 188 lb (85.3 kg)   Body mass index is 25.36 kg/m.   Physical Exam    Constitutional: Appears well-developed and well-nourished. No distress.  HENT:  Head: Normocephalic and atraumatic.  Neck: Neck supple. No tracheal deviation present. No thyromegaly present.  No cervical lymphadenopathy Cardiovascular: Normal rate, regular rhythm and normal heart sounds.   No murmur heard. No carotid bruit .   No edema Pulmonary/Chest: Effort normal and breath sounds normal. No respiratory distress. No has no wheezes. No rales.  Skin: Skin is warm and dry. Not diaphoretic.  Psychiatric: Normal mood and affect. Behavior is normal.  Assessment & Plan:    See Problem List for Assessment and Plan of chronic medical problems.

## 2017-03-30 NOTE — Patient Instructions (Addendum)
  All other Health Maintenance issues reviewed.   All recommended immunizations and age-appropriate screenings are up-to-date or discussed.  No immunizations administered today.   Medications reviewed and updated.  No changes recommended at this time. Avoid all nsaids (anti-inflammatories).    Please followup in 6 months

## 2017-03-31 ENCOUNTER — Ambulatory Visit (INDEPENDENT_AMBULATORY_CARE_PROVIDER_SITE_OTHER): Payer: Medicare Other | Admitting: *Deleted

## 2017-03-31 ENCOUNTER — Ambulatory Visit (INDEPENDENT_AMBULATORY_CARE_PROVIDER_SITE_OTHER): Payer: Medicare Other | Admitting: Internal Medicine

## 2017-03-31 ENCOUNTER — Encounter: Payer: Self-pay | Admitting: Internal Medicine

## 2017-03-31 VITALS — BP 124/78 | HR 84 | Temp 97.5°F | Resp 16 | Wt 187.0 lb

## 2017-03-31 DIAGNOSIS — Z8739 Personal history of other diseases of the musculoskeletal system and connective tissue: Secondary | ICD-10-CM | POA: Diagnosis not present

## 2017-03-31 DIAGNOSIS — N189 Chronic kidney disease, unspecified: Secondary | ICD-10-CM | POA: Insufficient documentation

## 2017-03-31 DIAGNOSIS — N183 Chronic kidney disease, stage 3 unspecified: Secondary | ICD-10-CM

## 2017-03-31 DIAGNOSIS — M545 Low back pain: Secondary | ICD-10-CM

## 2017-03-31 DIAGNOSIS — G8929 Other chronic pain: Secondary | ICD-10-CM | POA: Diagnosis not present

## 2017-03-31 DIAGNOSIS — Z0000 Encounter for general adult medical examination without abnormal findings: Secondary | ICD-10-CM

## 2017-03-31 DIAGNOSIS — R7303 Prediabetes: Secondary | ICD-10-CM

## 2017-03-31 DIAGNOSIS — N1831 Chronic kidney disease, stage 3a: Secondary | ICD-10-CM | POA: Insufficient documentation

## 2017-03-31 DIAGNOSIS — E7849 Other hyperlipidemia: Secondary | ICD-10-CM

## 2017-03-31 HISTORY — DX: Chronic kidney disease, unspecified: N18.9

## 2017-03-31 NOTE — Progress Notes (Addendum)
Subjective:   Tom Gaines is a 72 y.o. male who presents for Medicare Annual/Subsequent preventive examination.  Review of Systems:  No ROS.  Medicare Wellness Visit. Additional risk factors are reflected in the social history.  Cardiac Risk Factors include: advanced age (>82men, >60 women);dyslipidemia;male gender Sleep patterns: feels rested on waking, gets up 2-3 times nightly to void and sleeps 8-9 hours nightly.    Home Safety/Smoke Alarms: Feels safe in home. Smoke alarms in place.  Living environment; residence and Firearm Safety: 2-story house, no firearms.Lives with wife, no needs for DME, good support system Seat Belt Safety/Bike Helmet: Wears seat belt.   PSA-  Lab Results  Component Value Date   PSA 0.60 03/01/2010   PSA 0.68 09/17/2009   PSA 0.68 03/16/2009       Objective:    Vitals: There were no vitals taken for this visit.  There is no height or weight on file to calculate BMI.  Advanced Directives 03/31/2017 07/28/2015 03/21/2014  Does Patient Have a Medical Advance Directive? Yes Yes No;Yes  Type of Paramedic of [place];Living will - [place];Living will;Out of facility DNR (pink MOST or yellow form)  Copy of [place] in Chart? No - copy requested No - copy requested No - copy requested    Tobacco Social History   Tobacco Use  Smoking Status Former Smoker  . Last attempt to quit: 01/31/1985  . Years since quitting: 32.1  Smokeless Tobacco Never Used  Tobacco Comment   smoked  [place], up to 2.5 ppd     Counseling given: Not Answered Comment: smoked  [place], up to 2.5 ppd  Past Medical History:  Diagnosis Date  . Gout   . Prostate cancer ([place]) 1998   Past Surgical History:  Procedure Laterality Date  . CATARACT EXTRACTION, BILATERAL  2017  . colonoscopy with polypectomy  2014   Dr Fuller Plan  . LUMBAR DISC SURGERY  2002   Dr Ellene Route  . PROSTATECTOMY  1998  . SHOULDER  ARTHROSCOPY     right; 2011; left 1990  . TONSILLECTOMY AND ADENOIDECTOMY     Family History  Problem Relation Age of Onset  . Diabetes Mother   . Dementia Mother   . Diabetes Sister   . Lung cancer Unknown        maternal uncle & aunt  . Colon cancer Neg Hx   . Stomach cancer Neg Hx   . Arthritis Neg Hx   . Heart disease Neg Hx   . Stroke Neg Hx    Social History   Socioeconomic History  . Marital status: Married    Spouse name: Not on file  . Number of children: Not on file  . Years of education: Not on file  . Highest education level: Not on file  Social Needs  . Financial resource strain: Not on file  . Food insecurity - worry: Not on file  . Food insecurity - inability: Not on file  . Transportation needs - medical: Not on file  . Transportation needs - non-medical: Not on file  Occupational History  . Not on file  Tobacco Use  . Smoking status: Former Smoker    Last attempt to quit: 01/31/1985    Years since quitting: 32.1  . Smokeless tobacco: Never Used  . Tobacco comment: smoked  [place], up to 2.5 ppd  Substance and Sexual Activity  . Alcohol  use: No    Comment:  none since 2012  . Drug use: No  . Sexual activity: Not on file  Other Topics Concern  . Not on file  Social History Narrative   Exercises regularly - gym 5 days a week    Outpatient Encounter Medications as of 03/31/2017  Medication Sig  . acetaminophen (TYLENOL) 500 MG tablet Take 500 mg by mouth every 6 (six) hours as needed.  Marland Kitchen acyclovir (ZOVIRAX) 800 MG tablet Take 400 mg by mouth 2 (two) times daily.   Marland Kitchen allopurinol (ZYLOPRIM) 300 MG tablet TAKE 1 TABLET BY MOUTH  DAILY  . Ascorbic Acid (VITAMIN C) 100 MG tablet Take 100 mg by mouth daily.  Marland Kitchen aspirin 81 MG tablet Take 81 mg by mouth 2 (two) times daily.   . cetirizine (ZYRTEC) 10 MG tablet Take 10 mg by mouth as needed.   . Cholecalciferol (VITAMIN D) 2000 units CAPS Take 1 capsule by mouth daily.  . fenofibrate (TRICOR) 145 MG  tablet TAKE 1 TABLET BY MOUTH  DAILY  . fluticasone (FLONASE) 50 MCG/ACT nasal spray USE 1 SPRAY NASALLY TWICE A DAY AS NEEDED  . Misc Natural Products (TART CHERRY ADVANCED PO) Take 1 capsule by mouth daily.  . Multiple Vitamin (MULTIVITAMIN) tablet Take 1 tablet by mouth daily.    . Omega-3 Fatty Acids (FISH OIL) 1000 MG CAPS Take 1,000 mg by mouth. 1 by mouth daily  . traMADol (ULTRAM) 50 MG tablet TK 1 TO 2 TS PO Q 6 H PRN P  . Turmeric Curcumin 500 MG CAPS Take 500 mg by mouth 3 (three) times daily.   . [DISCONTINUED] gabapentin (NEURONTIN) 100 MG capsule TAKE 2 CAPSULES BY MOUTH AT BEDTIME  . [DISCONTINUED] meloxicam (MOBIC) 15 MG tablet Take 15 mg by mouth daily as needed.   . [DISCONTINUED] naproxen sodium (ALEVE) 220 MG tablet Take 220 mg by mouth as needed.     No facility-administered encounter medications on file as of 03/31/2017.     Activities of Daily Living In your present state of health, do you have any difficulty performing the following activities: 03/31/2017  Hearing? N  Vision? N  Difficulty concentrating or making decisions? N  Walking or climbing stairs? N  Dressing or bathing? N  Doing errands, shopping? N  Preparing Food and eating ? N  Using the Toilet? N  In the past six months, have you accidently leaked urine? N  Do you have problems with loss of bowel control? N  Managing your Medications? N  Managing your Finances? N  Housekeeping or managing your Housekeeping? N  Some recent data might be hidden    Patient Care Team: Binnie Rail, MD as PCP - General (Internal Medicine)   Assessment:   This is a routine wellness examination for Tom Gaines. Physical assessment deferred to PCP.   Exercise Activities and Dietary recommendations Current Exercise Habits: Structured exercise class, Type of exercise: walking;strength training/weights;calisthenics;stretching, Time (Minutes): 50, Frequency (Times/Week): 5, Weekly Exercise (Minutes/Week): 250, Exercise limited  by: None identified  Diet (meal preparation, eat out, water intake, caffeinated beverages, dairy products, fruits and vegetables): in general, a "healthy" diet  , well balanced   Reviewed heart healthy and diabetic diet, encouraged patient to increase daily water intake. Relevant patient education assigned to patient using Emmi. Diet education was attached to patient's AVS.  Goals    . Patient Stated     Continue to enjoy life and family  Fall Risk Fall Risk  03/31/2017 07/08/2016 03/30/2015 02/17/2014 02/13/2013  Falls in the past year? No No No No No   Depression Screen PHQ 2/9 Scores 03/31/2017 07/08/2016 03/30/2015 02/17/2014  PHQ - 2 Score 0 0 0 0  PHQ- 9 Score 0 - - -    Cognitive Function       Ad8 score reviewed for issues:  Issues making decisions: no  Less interest in hobbies / activities: no  Repeats questions, stories (family complaining): no  Trouble using ordinary gadgets (microwave, computer, phone):no  Forgets the month or year: no  Mismanaging finances: no  Remembering appts: no  Daily problems with thinking and/or memory: no Ad8 score is= 0  Immunization History  Administered Date(s) Administered  . Influenza Whole 08-12-1945, 10/08/2008, 11/08/2011  . Influenza, High Dose Seasonal PF 10/22/2012, 11/20/2015  . Influenza,inj,Quad PF,6+ Mos 10/10/2014  . Influenza-Unspecified 11/14/2016  . Pneumococcal Conjugate-13 10/10/2014  . Pneumococcal Polysaccharide-23 02/13/2013  . Td 03/12/2008  . Zoster 05/30/2006  . Zoster Recombinat (Shingrix) 05/27/2016   Screening Tests Health Maintenance  Topic Date Due  . COLONOSCOPY  02/05/2017  . TETANUS/TDAP  03/12/2018  . INFLUENZA VACCINE  Completed  . Hepatitis C Screening  Completed  . PNA vac Low Risk Adult  Completed      Plan:    Continue doing brain stimulating activities (puzzles, reading, adult coloring books, staying active) to keep memory sharp.   Continue to eat heart healthy diet (full of  fruits, vegetables, whole grains, lean protein, water--limit salt, fat, and sugar intake) and increase physical activity as tolerated.  I have personally reviewed and noted the following in the patient's chart:   . Medical and social history . Use of alcohol, tobacco or illicit drugs  . Current medications and supplements . Functional ability and status . Nutritional status . Physical activity . Advanced directives . List of other physicians . Vitals . Screenings to include cognitive, depression, and falls . Referrals and appointments  In addition, I have reviewed and discussed with patient certain preventive protocols, quality metrics, and best practice recommendations. A written personalized care plan for preventive services as well as general preventive health recommendations were provided to patient.     Michiel Cowboy, RN  03/31/2017    Medical screening examination/treatment/procedure(s) were performed by non-physician practitioner and as supervising physician I was immediately available for consultation/collaboration. I agree with above. Binnie Rail, MD

## 2017-03-31 NOTE — Assessment & Plan Note (Signed)
Kidney disease slightly worse, likely related to taking NSAIDs Discussed that any NSAIDs are not good for his kidneys and advised him not to take any Continue Tylenol and tramadol for back pain Continue increased fluids We will need to monitor more closely and at least twice a year-if kidney function worsens will refer to nephrology

## 2017-03-31 NOTE — Patient Instructions (Addendum)
Continue doing brain stimulating activities (puzzles, reading, adult coloring books, staying active) to keep memory sharp.   Continue to eat heart healthy diet (full of fruits, vegetables, whole grains, lean protein, water--limit salt, fat, and sugar intake) and increase physical activity as tolerated.  Tom Gaines , Thank you for taking time to come for your Medicare Wellness Visit. I appreciate your ongoing commitment to your health goals. Please review the following plan we discussed and let me know if I can assist you in the future.   These are the goals we discussed: Goals    . Patient Stated     Continue to enjoy life and family       This is a list of the screening recommended for you and due dates:  Health Maintenance  Topic Date Due  . Colon Cancer Screening  02/05/2017  . Tetanus Vaccine  03/12/2018  . Flu Shot  Completed  .  Hepatitis C: One time screening is recommended by Center for Disease Control  (CDC) for  adults born from [birth year] through 1965.   Completed  . Pneumonia vaccines  Completed    Carbohydrate Counting for Diabetes Mellitus, Adult Carbohydrate counting is a method for keeping track of how many carbohydrates you eat. Eating carbohydrates naturally increases the amount of sugar (glucose) in the blood. Counting how many carbohydrates you eat helps keep your blood glucose within normal limits, which helps you manage your diabetes (diabetes mellitus). It is important to know how many carbohydrates you can safely have in each meal. This is different for every person. A diet and nutrition specialist (registered dietitian) can help you make a meal plan and calculate how many carbohydrates you should have at each meal and snack. Carbohydrates are found in the following foods:  Grains, such as breads and cereals.  Dried beans and soy products.  Starchy vegetables, such as potatoes, peas, and corn.  Fruit and fruit juices.  Milk and yogurt.  Sweets and snack foods,  such as cake, cookies, candy, chips, and soft drinks.  How do I count carbohydrates? There are two ways to count carbohydrates in food. You can use either of the methods or a combination of both. Reading "Nutrition Facts" on packaged food The "Nutrition Facts" list is included on the labels of almost all packaged foods and beverages in the [place] It includes:  The serving size.  Information about nutrients in each serving, including the grams (g) of carbohydrate per serving.  To use the "Nutrition Facts":  Decide how many servings you will have.  Multiply the number of servings by the number of carbohydrates per serving.  The resulting number is the total amount of carbohydrates that you will be having.  Learning standard serving sizes of other foods When you eat foods containing carbohydrates that are not packaged or do not include "Nutrition Facts" on the label, you need to measure the servings in order to count the amount of carbohydrates:  Measure the foods that you will eat with a food scale or measuring cup, if needed.  Decide how many standard-size servings you will eat.  Multiply the number of servings by 15. Most carbohydrate-rich foods have about 15 g of carbohydrates per serving. ? For example, if you eat 8 oz (170 g) of strawberries, you will have eaten 2 servings and 30 g of carbohydrates (2 servings x 15 g = 30 g).  For foods that have more than one food mixed, such as soups and casseroles, you must count  the carbohydrates in each food that is included.  The following list contains standard serving sizes of common carbohydrate-rich foods. Each of these servings has about 15 g of carbohydrates:   hamburger bun or  English muffin.   oz (15 mL) syrup.   oz (14 g) jelly.  1 slice of bread.  1 six-inch tortilla.  3 oz (85 g) cooked rice or pasta.  4 oz (113 g) cooked dried beans.  4 oz (113 g) starchy vegetable, such as peas, corn, or potatoes.  4 oz (113  g) hot cereal.  4 oz (113 g) mashed potatoes or  of a large baked potato.  4 oz (113 g) canned or frozen fruit.  4 oz (120 mL) fruit juice.  4-6 crackers.  6 chicken nuggets.  6 oz (170 g) unsweetened dry cereal.  6 oz (170 g) plain fat-free yogurt or yogurt sweetened with artificial sweeteners.  8 oz (240 mL) milk.  8 oz (170 g) fresh fruit or one small piece of fruit.  24 oz (680 g) popped popcorn.  Example of carbohydrate counting Sample meal  3 oz (85 g) chicken breast.  6 oz (170 g) brown rice.  4 oz (113 g) corn.  8 oz (240 mL) milk.  8 oz (170 g) strawberries with sugar-free whipped topping. Carbohydrate calculation 1. Identify the foods that contain carbohydrates: ? Rice. ? Corn. ? Milk. ? Strawberries. 2. Calculate how many servings you have of each food: ? 2 servings rice. ? 1 serving corn. ? 1 serving milk. ? 1 serving strawberries. 3. Multiply each number of servings by 15 g: ? 2 servings rice x 15 g = 30 g. ? 1 serving corn x 15 g = 15 g. ? 1 serving milk x 15 g = 15 g. ? 1 serving strawberries x 15 g = 15 g. 4. Add together all of the amounts to find the total grams of carbohydrates eaten: ? 30 g + 15 g + 15 g + 15 g = 75 g of carbohydrates total. This information is not intended to replace advice given to you by your health care provider. Make sure you discuss any questions you have with your health care provider. Document Released: 01/17/2005 Document Revised: 08/07/2015 Document Reviewed: 07/01/2015 Elsevier Interactive Patient Education  2018 [place] Content in Foods Generally, most healthy people need around 50 grams of protein each day. Depending on your overall health, you may need more or less protein in your diet. Talk to your health care provider or dietitian about how much protein you need. See the following list for the protein content of some common foods. High-protein foods High-protein foods contain 4 grams (4  g) or more of protein per serving. They include:  Beef, ground sirloin (cooked) - 3 oz have 24 g of protein.  Cheese (hard) - 1 oz has 7 g of protein.  Chicken breast, boneless and skinless (cooked) - 3 oz have 13.4 g of protein.  Cottage cheese - 1/2 cup has 13.4 g of protein.  Egg - 1 egg has 6 g of protein.  Fish, filet (cooked) - 1 oz has 6-7 g of protein.  Garbanzo beans (canned or cooked) - 1/2 cup has 6-7 g of protein.  Kidney beans (canned or cooked) - 1/2 cup has 6-7 g of protein.  Lamb (cooked) - 3 oz has 24 g of protein.  Milk - 1 cup (8 oz) has 8 g of protein.  Nuts (peanuts, pistachios, almonds) -  1 oz has 6 g of protein.  Peanut butter - 1 oz has 7-8 g of protein.  Pork tenderloin (cooked) - 3 oz has 18.4 g of protein.  Pumpkin seeds - 1 oz has 8.5 g of protein.  Soybeans (roasted) - 1 oz has 8 g of protein.  Soybeans (cooked) - 1/2 cup has 11 g of protein.  Soy milk - 1 cup (8 oz) has 5-10 g of protein.  Soy or vegetable patty - 1 patty has 11 g of protein.  Sunflower seeds - 1 oz has 5.5 g of protein.  Tofu (firm) - 1/2 cup has 20 g of protein.  Tuna (canned in water) - 3 oz has 20 g of protein.  Yogurt - 6 oz has 8 g of protein.  Low-protein foods Low-protein foods contain 3 grams (3 g) or less of protein per serving. They include:  Beets (raw or cooked) - 1/2 cup has 1.5 g of protein.  Bran cereal - 1/2 cup has 2-3 g of protein.  Bread - 1 slice has 2.5 g of protein.  Broccoli (raw or cooked) - 1/2 cup has 2 g of protein.  Collard greens (raw or cooked) - 1/2 cup has 2 g of protein.  Corn (fresh or cooked) - 1/2 cup has 2 g of protein.  Cream cheese - 1 oz has 2 g of protein.  Creamer (half-and-half) - 1 oz has 1 g of protein.  Flour tortilla - 1 tortilla has 2.5 g of protein  Frozen yogurt - 1/2 cup has 3 g of protein.  Fruit or vegetable juice - 1/2 cup has 1 g of protein.  Green beans (raw or cooked) - 1/2 cup has 1 g of  protein.  Green peas (canned) - 1/2 cup has 3.5 g of protein.  Muffins - 1 small muffin (2 oz) has 3 g of protein.  Oatmeal (cooked) - 1/2 cup has 3 g of protein.  Potato (baked with skin) - 1 medium potato has 3 g of protein.  Rice (cooked) - 1/2 cup has 2.5-3.5 g of protein.  Sour cream - 1/2 cup has 2.5 g of protein.  Spinach (cooked) - 1/2 cup has 3 g of protein.  Squash (cooked) - 1/2 cup has 1.5 g of protein.  Actual amounts of protein may be different depending on processing. Talk with your health care provider or dietitian about what foods are recommended for you. This information is not intended to replace advice given to you by your health care provider. Make sure you discuss any questions you have with your health care provider. Document Released: 04/18/2015 Document Revised: 09/28/2015 Document Reviewed: 09/28/2015 Elsevier Interactive Patient Education  [employer].

## 2017-03-31 NOTE — Assessment & Plan Note (Signed)
No gout symptoms Continue allopurinol 

## 2017-03-31 NOTE — Assessment & Plan Note (Addendum)
Doing exercises - helpful Taking tylenol and aleve Taking tramadol only as needed Advised him not to take any other NSAIDs-discontinue Aleve Can continue tramadol and Tylenol has been related to kidney disease I will prescribe his tramadol Follow-up in 6 months

## 2017-03-31 NOTE — Assessment & Plan Note (Signed)
Lipid panel well controlled Continue fenofibrate Continue regular exercise and heart healthy diet

## 2017-03-31 NOTE — Assessment & Plan Note (Signed)
A1c reviewed Discussed prediabetes Decreasing carbohydrates Maintain good weight and continue regular exercise Follow-up in 6 months

## 2017-04-09 ENCOUNTER — Encounter: Payer: Self-pay | Admitting: Internal Medicine

## 2017-04-10 MED ORDER — TRAMADOL HCL 50 MG PO TABS: ORAL_TABLET | ORAL | 0 refills | Status: DC

## 2017-04-10 NOTE — Telephone Encounter (Signed)
[place] Controlled Substance Database checked. Last filled on 01/03/17  Sharee Pimple please contact pt, not sure what the concern is.

## 2017-04-12 ENCOUNTER — Ambulatory Visit (AMBULATORY_SURGERY_CENTER): Payer: Self-pay

## 2017-04-12 ENCOUNTER — Other Ambulatory Visit: Payer: Self-pay

## 2017-04-12 VITALS — Ht 72.0 in | Wt 190.8 lb

## 2017-04-12 DIAGNOSIS — Z86010 Personal history of colonic polyps: Secondary | ICD-10-CM

## 2017-04-12 MED ORDER — NA SULFATE-K SULFATE-MG SULF 17.5-3.13-1.6 GM/177ML PO SOLN: 1.0000 | Freq: Once | ORAL | 0 refills | Status: AC

## 2017-04-12 NOTE — Progress Notes (Signed)
Denies allergies to eggs or soy products. Denies complication of anesthesia or sedation. Denies use of weight loss medication. Denies use of O2.   Emmi instructions declined.  

## 2017-04-14 ENCOUNTER — Encounter: Payer: Self-pay | Admitting: Gastroenterology

## 2017-04-19 ENCOUNTER — Other Ambulatory Visit: Payer: Self-pay | Admitting: Family Medicine

## 2017-04-24 ENCOUNTER — Ambulatory Visit: Payer: Medicare Other | Admitting: Family Medicine

## 2017-04-26 ENCOUNTER — Other Ambulatory Visit: Payer: Self-pay

## 2017-04-26 ENCOUNTER — Ambulatory Visit (AMBULATORY_SURGERY_CENTER): Payer: Medicare Other | Admitting: Gastroenterology

## 2017-04-26 ENCOUNTER — Encounter: Payer: Self-pay | Admitting: Gastroenterology

## 2017-04-26 VITALS — BP 124/78 | HR 65 | Temp 98.9°F | Resp 20 | Ht 72.0 in | Wt 190.0 lb

## 2017-04-26 DIAGNOSIS — Z86010 Personal history of colonic polyps: Secondary | ICD-10-CM | POA: Diagnosis not present

## 2017-04-26 DIAGNOSIS — Z538 Procedure and treatment not carried out for other reasons: Secondary | ICD-10-CM

## 2017-04-26 MED ORDER — SODIUM CHLORIDE 0.9 % IV SOLN: 500.0000 mL | Freq: Once | INTRAVENOUS | Status: DC

## 2017-04-26 NOTE — Patient Instructions (Signed)
Handouts given for high fiber diet, hemorrhoids, and diverticulosis.  YOU HAD AN ENDOSCOPIC PROCEDURE TODAY AT [facility]:   Refer to the procedure report that was given to you for any specific questions about what was found during the examination.  If the procedure report does not answer your questions, please call your gastroenterologist to clarify.  If you requested that your care partner not be given the details of your procedure findings, then the procedure report has been included in a sealed envelope for you to review at your convenience later.  YOU SHOULD EXPECT: Some feelings of bloating in the abdomen. Passage of more gas than usual.  Walking can help get rid of the air that was put into your GI tract during the procedure and reduce the bloating. If you had a lower endoscopy (such as a colonoscopy or flexible sigmoidoscopy) you may notice spotting of blood in your stool or on the toilet paper. If you underwent a bowel prep for your procedure, you may not have a normal bowel movement for a few days.  Please Note:  You might notice some irritation and congestion in your nose or some drainage.  This is from the oxygen used during your procedure.  There is no need for concern and it should clear up in a day or so.  SYMPTOMS TO REPORT IMMEDIATELY:   Following lower endoscopy (colonoscopy or flexible sigmoidoscopy):  Excessive amounts of blood in the stool  Significant tenderness or worsening of abdominal pains  Swelling of the abdomen that is new, acute  Fever of 100F or higher   For urgent or emergent issues, a gastroenterologist can be reached at any hour by calling [phone].   DIET:  We do recommend a small meal at first, but then you may proceed to your regular diet.  Drink plenty of fluids but you should avoid alcoholic beverages for 24 hours.  ACTIVITY:  You should plan to take it easy for the rest of today and you should NOT DRIVE or use heavy machinery  until tomorrow (because of the sedation medicines used during the test).    FOLLOW UP: Our staff will call the number listed on your records the next business day following your procedure to check on you and address any questions or concerns that you may have regarding the information given to you following your procedure. If we do not reach you, we will leave a message.  However, if you are feeling well and you are not experiencing any problems, there is no need to return our call.  We will assume that you have returned to your regular daily activities without incident.  If any biopsies were taken you will be contacted by phone or by letter within the next 1-3 weeks.  Please call us at [phone] if you have not heard about the biopsies in 3 weeks.    SIGNATURES/CONFIDENTIALITY: You and/or your care partner have signed paperwork which will be entered into your electronic medical record.  These signatures attest to the fact that that the information above on your After Visit Summary has been reviewed and is understood.  Full responsibility of the confidentiality of this discharge information lies with you and/or your care-partner.

## 2017-04-26 NOTE — Progress Notes (Signed)
Report to RN, VSS, adequate respirations noted, no c/o pain or discomfort 

## 2017-04-26 NOTE — Op Note (Signed)
[place] Patient Name: Tom Gaines Procedure Date: 04/26/2017 11:50 AM MRN: [id number] Endoscopist: Ladene Artist , MD Age: 72 Referring MD:  Date of Birth: 1945-11-04 Gender: Male Account #: [id number] Procedure:                Colonoscopy Indications:              Surveillance: Personal history of adenomatous                            polyps on last colonoscopy 5 years ago Medicines:                Monitored Anesthesia Care Procedure:                Pre-Anesthesia Assessment:                           - Prior to the procedure, a History and Physical                            was performed, and patient medications and                            allergies were reviewed. The patient's tolerance of                            previous anesthesia was also reviewed. The risks                            and benefits of the procedure and the sedation                            options and risks were discussed with the patient.                            All questions were answered, and informed consent                            was obtained. Prior Anticoagulants: The patient has                            taken no previous anticoagulant or antiplatelet                            agents. ASA Grade Assessment: II - A patient with                            mild systemic disease. After reviewing the risks                            and benefits, the patient was deemed in                            satisfactory condition to undergo the procedure.  After obtaining informed consent, the colonoscope                            was passed under direct vision. Throughout the                            procedure, the patient's blood pressure, pulse, and                            oxygen saturations were monitored continuously. The                            Model PCF-H190L [id number]) scope was introduced                            through the anus and  advanced to the the transverse                            colon. The rectum was photographed. The quality of                            the bowel preparation was good. The patient                            tolerated the procedure well. The colonoscopy was                            technically difficult and complex due to multiple                            diverticula in the colon and a tortuous colon.                            Difficult to advance past a tortuous, spastic                            segement of the descending colon. After passing                            that area with the adult endoscope I was unable to                            advance the adult endoscope beyond the transverse                            colon due to looping and the length of the                            endoscope. Successful completion of the procedure                            was aided by changing the patient's position,  withdrawing and reinserting the scope and                            withdrawing the scope and replacing with the adult                            endoscope. Scope In: Scope Out: Findings:                 The perianal and digital rectal examinations were                            normal.                           Multiple medium-mouthed diverticula were found in                            the sigmoid colon, descending colon, splenic                            flexure and transverse colon. There was narrowing                            of the colon in association with the diverticular                            opening. There was evidence of diverticular spasm.                            The colon was tortuous. There was no evidence of                            diverticular bleeding.                           Internal hemorrhoids were found during                            retroflexion. The hemorrhoids were small and Grade                             I (internal hemorrhoids that do not prolapse).                           The exam was otherwise without abnormality on                            direct and retroflexion views. Complications:            No immediate complications. Estimated blood loss:                            None. Estimated Blood Loss:     Estimated blood loss: none. Impression:               - Incomplete colonoscopy.                           -  Severe diverticulosis in the sigmoid colon, in                            the descending colon, at the splenic flexure and in                            the transverse colon. There was narrowing of the                            colon in association with the diverticular opening.                            There was evidence of diverticular spasm. There was                            no evidence of diverticular bleeding.                           - Internal hemorrhoids.                           - The examination was otherwise normal on direct                            and retroflexion views.                           - No specimens collected. Recommendation:           - Patient has a contact number available for                            emergencies. The signs and symptoms of potential                            delayed complications were discussed with the                            patient. Return to normal activities tomorrow.                            Written discharge instructions were provided to the                            patient.                           - High fiber diet.                           - Continue present medications.                           - Perform an air contrast barium enema if virtual  colonoscopy not covered at appointment to be                            scheduled.                           - Perform a virtual colonoscopy at appointment to                            be scheduled. Ladene Artist,  MD 04/26/2017 12:10:32 PM This report has been signed electronically.

## 2017-04-27 ENCOUNTER — Telehealth: Payer: Self-pay

## 2017-04-27 DIAGNOSIS — K56699 Other intestinal obstruction unspecified as to partial versus complete obstruction: Secondary | ICD-10-CM

## 2017-04-27 NOTE — Telephone Encounter (Signed)
Per procedure report needs Virtual colonoscopy vs BE.  See colonoscopy report from 04/27/17   Patient notified of virtual colonoscopy scheduled for 05/10/17 at [address] Arrive at 7:40 am  He will need to go by [facility] to pick up prep kit prior to scan.

## 2017-05-10 ENCOUNTER — Ambulatory Visit
Admission: RE | Admit: 2017-05-10 | Discharge: 2017-05-10 | Disposition: A | Discharge status: Home / Self Care | Payer: Medicare Other | Source: Ambulatory Visit | Attending: Gastroenterology | Admitting: Gastroenterology

## 2017-05-10 DIAGNOSIS — K5730 Diverticulosis of large intestine without perforation or abscess without bleeding: Secondary | ICD-10-CM | POA: Diagnosis not present

## 2017-05-10 DIAGNOSIS — K56699 Other intestinal obstruction unspecified as to partial versus complete obstruction: Secondary | ICD-10-CM

## 2017-05-24 DIAGNOSIS — M25511 Pain in right shoulder: Secondary | ICD-10-CM | POA: Diagnosis not present

## 2017-05-24 DIAGNOSIS — M25512 Pain in left shoulder: Secondary | ICD-10-CM | POA: Diagnosis not present

## 2017-05-30 DIAGNOSIS — M25511 Pain in right shoulder: Secondary | ICD-10-CM | POA: Diagnosis not present

## 2017-05-30 DIAGNOSIS — M25512 Pain in left shoulder: Secondary | ICD-10-CM | POA: Diagnosis not present

## 2017-06-03 ENCOUNTER — Other Ambulatory Visit: Payer: Self-pay | Admitting: Internal Medicine

## 2017-06-13 DIAGNOSIS — M19012 Primary osteoarthritis, left shoulder: Secondary | ICD-10-CM | POA: Diagnosis not present

## 2017-06-13 DIAGNOSIS — M19011 Primary osteoarthritis, right shoulder: Secondary | ICD-10-CM | POA: Diagnosis not present

## 2017-06-17 ENCOUNTER — Other Ambulatory Visit: Payer: Self-pay | Admitting: Internal Medicine

## 2017-06-21 DIAGNOSIS — H31091 Other chorioretinal scars, right eye: Secondary | ICD-10-CM | POA: Diagnosis not present

## 2017-06-21 DIAGNOSIS — H11132 Conjunctival pigmentations, left eye: Secondary | ICD-10-CM | POA: Diagnosis not present

## 2017-06-21 DIAGNOSIS — Z961 Presence of intraocular lens: Secondary | ICD-10-CM | POA: Diagnosis not present

## 2017-06-21 DIAGNOSIS — M19011 Primary osteoarthritis, right shoulder: Secondary | ICD-10-CM | POA: Diagnosis not present

## 2017-06-21 DIAGNOSIS — M19012 Primary osteoarthritis, left shoulder: Secondary | ICD-10-CM | POA: Diagnosis not present

## 2017-06-21 DIAGNOSIS — D3131 Benign neoplasm of right choroid: Secondary | ICD-10-CM | POA: Diagnosis not present

## 2017-06-28 DIAGNOSIS — M25511 Pain in right shoulder: Secondary | ICD-10-CM | POA: Diagnosis not present

## 2017-06-28 DIAGNOSIS — M25512 Pain in left shoulder: Secondary | ICD-10-CM | POA: Diagnosis not present

## 2017-06-28 DIAGNOSIS — M7541 Impingement syndrome of right shoulder: Secondary | ICD-10-CM | POA: Diagnosis not present

## 2017-06-28 DIAGNOSIS — M6281 Muscle weakness (generalized): Secondary | ICD-10-CM | POA: Diagnosis not present

## 2017-07-10 DIAGNOSIS — R972 Elevated prostate specific antigen [PSA]: Secondary | ICD-10-CM | POA: Diagnosis not present

## 2017-07-10 DIAGNOSIS — C61 Malignant neoplasm of prostate: Secondary | ICD-10-CM | POA: Diagnosis not present

## 2017-07-17 DIAGNOSIS — R972 Elevated prostate specific antigen [PSA]: Secondary | ICD-10-CM | POA: Diagnosis not present

## 2017-07-17 DIAGNOSIS — N529 Male erectile dysfunction, unspecified: Secondary | ICD-10-CM | POA: Diagnosis not present

## 2017-07-17 DIAGNOSIS — C61 Malignant neoplasm of prostate: Secondary | ICD-10-CM | POA: Diagnosis not present

## 2017-07-17 DIAGNOSIS — Z9079 Acquired absence of other genital organ(s): Secondary | ICD-10-CM | POA: Diagnosis not present

## 2017-09-13 ENCOUNTER — Other Ambulatory Visit: Payer: Self-pay | Admitting: Internal Medicine

## 2017-09-13 NOTE — Telephone Encounter (Signed)
Last refill was 04/10/17 per database #[id number].  Last OV-03/31/17 Next OV-10/03/17

## 2017-09-14 MED ORDER — TRAMADOL HCL 50 MG PO TABS: ORAL_TABLET | ORAL | 0 refills | Status: DC

## 2017-09-27 ENCOUNTER — Encounter: Payer: Self-pay | Admitting: Internal Medicine

## 2017-09-27 DIAGNOSIS — E7849 Other hyperlipidemia: Secondary | ICD-10-CM

## 2017-09-27 DIAGNOSIS — R7303 Prediabetes: Secondary | ICD-10-CM

## 2017-09-27 DIAGNOSIS — N183 Chronic kidney disease, stage 3 unspecified: Secondary | ICD-10-CM

## 2017-09-28 ENCOUNTER — Other Ambulatory Visit (INDEPENDENT_AMBULATORY_CARE_PROVIDER_SITE_OTHER): Payer: Medicare Other

## 2017-09-28 DIAGNOSIS — N183 Chronic kidney disease, stage 3 unspecified: Secondary | ICD-10-CM

## 2017-09-28 DIAGNOSIS — R7303 Prediabetes: Secondary | ICD-10-CM | POA: Diagnosis not present

## 2017-09-28 DIAGNOSIS — E7849 Other hyperlipidemia: Secondary | ICD-10-CM

## 2017-09-28 LAB — LIPID PANEL
Cholesterol: 158 mg/dL (ref 0–200)
HDL: 61.9 mg/dL (ref >39.00)
LDL Cholesterol: 84 mg/dL (ref 0–99)
NonHDL: 95.8
TRIGLYCERIDES: 60 mg/dL (ref 0.0–149.0)
Total CHOL/HDL Ratio: 3
VLDL: 12 mg/dL (ref 0.0–40.0)

## 2017-09-28 LAB — COMPREHENSIVE METABOLIC PANEL
ALBUMIN: 4.4 g/dL (ref 3.5–5.2)
ALK PHOS: 41 U/L (ref 39–117)
ALT: 17 U/L (ref 0–53)
AST: 24 U/L (ref 0–37)
BILIRUBIN TOTAL: 0.5 mg/dL (ref 0.2–1.2)
BUN: 30 mg/dL — ABNORMAL HIGH (ref 6–23)
CO2: 28 mEq/L (ref 19–32)
Calcium: 9.6 mg/dL (ref 8.4–10.5)
Chloride: 106 mEq/L (ref 96–112)
Creatinine, Ser: 1.61 mg/dL — ABNORMAL HIGH (ref 0.40–1.50)
GFR: 45.01 mL/min — ABNORMAL LOW (ref >60.00)
Glucose, Bld: 90 mg/dL (ref 70–99)
Potassium: 4.5 mEq/L (ref 3.5–5.1)
Sodium: 140 mEq/L (ref 135–145)
TOTAL PROTEIN: 7 g/dL (ref 6.0–8.3)

## 2017-09-28 LAB — HEMOGLOBIN A1C: Hgb A1c MFr Bld: 5.9 % (ref 4.6–6.5)

## 2017-10-02 NOTE — Progress Notes (Signed)
Subjective:    Patient ID: Tom Titsworth, male    DOB: [birth year], 72 y.o.   MRN: [id number]  HPI The patient is here for follow up.  Prediabetes:  He is even more compliant with a low sugar/carbohydrate diet.  He is exercising regularly - 5/week.  He has lost weight-mostly from decreasing carbohydrates even more and eating more healthy.  He is always exercise regularly.    Hyperlipidemia: He is taking his medication daily. He is compliant with a low fat/cholesterol diet. He is exercising regularly. He denies myalgias.   CKD:  He no longer takes any aleve.  He does drink water throughout the day.  H/o gout:  He is taking allopurinol daily.  He denies any gout symptoms.  Lower back pain, bilateral shoulder pain:    He sees Dr Lynann Bologna.  He takes tylenol for is back pain.  He is having bilatearl shoulder pain and would like to take tramadol as needed for the shoudlers as well.    He feels a slump in the afternoon around 3pm.  He feels his sleep is good.   He eats a healthy lunch low in carbohydrates.  He does not feel like his sugar drops.  He has not checked his blood pressure at this time.  He has not tried taking a nap or eating anything to see if that helps.  This slump lasts maybe an hour and then he feels fine.  Medications and allergies reviewed with patient and updated if appropriate.  Patient Active Problem List   Diagnosis Date Noted  . CKD (chronic kidney disease) 03/31/2017  . Bilateral shoulder bursitis 02/01/2017  . Acute shoulder bursitis, right 05/17/2016  . Prediabetes 03/29/2016  . Nonallopathic lesion of lumbosacral region 12/01/2015  . Nonallopathic lesion of sacral region 12/01/2015  . Nonallopathic lesion of thoracic region 12/01/2015  . Chest wall discomfort 11/20/2015  . Low back pain 03/13/2014  . Basal cell carcinoma 02/13/2013  . Hyperlipidemia 03/12/2008  . History of gout 03/12/2008  . PROSTATE CANCER, HX OF 03/12/2008  . History of colonic polyps  03/12/2008    Current Outpatient Medications on File Prior to Visit  Medication Sig Dispense Refill  . acetaminophen (TYLENOL) 500 MG tablet Take 500 mg by mouth every 6 (six) hours as needed.    Marland Kitchen acyclovir (ZOVIRAX) 800 MG tablet Take 400 mg by mouth 2 (two) times daily.     Marland Kitchen allopurinol (ZYLOPRIM) 300 MG tablet TAKE 1 TABLET BY MOUTH  DAILY 90 tablet 1  . Ascorbic Acid (VITAMIN C) 100 MG tablet Take 100 mg by mouth daily.    . cetirizine (ZYRTEC) 10 MG tablet Take 10 mg by mouth as needed.     . Cholecalciferol (VITAMIN D) 2000 units CAPS Take 1 capsule by mouth daily.    . fenofibrate (TRICOR) 145 MG tablet TAKE 1 TABLET BY MOUTH  DAILY 90 tablet 1  . fluticasone (FLONASE) 50 MCG/ACT nasal spray USE 1 SPRAY NASALLY TWICE A DAY AS NEEDED 48 g 1  . Folic Acid-Vit Z0-CHE N27 (FA-VITAMIN B-6-VITAMIN B-12 PO) Take by mouth daily.    . Misc Natural Products (TART CHERRY ADVANCED PO) Take 1 capsule by mouth daily.    . Multiple Vitamin (MULTIVITAMIN) tablet Take 1 tablet by mouth daily.      . Omega-3 Fatty Acids (FISH OIL) 1000 MG CAPS Take 1,000 mg by mouth. 1 by mouth daily    . traMADol (ULTRAM) 50 MG tablet TK 1 TO 2  TS PO Q 6 H PRN P 30 tablet 0  . Turmeric Curcumin 500 MG CAPS Take 500 mg by mouth 3 (three) times daily.      No current facility-administered medications on file prior to visit.     Past Medical History:  Diagnosis Date  . Allergy   . Arthritis   . Cataract   . Gout   . Prostate cancer ([place]) 1998    Past Surgical History:  Procedure Laterality Date  . CATARACT EXTRACTION, BILATERAL  2017  . colonoscopy with polypectomy  2014   Dr Fuller Plan  . LUMBAR DISC SURGERY  2002   Dr Ellene Route  . PROSTATECTOMY  1998  . SHOULDER ARTHROSCOPY     right; 2011; left 1990  . TONSILLECTOMY AND ADENOIDECTOMY      Social History   Socioeconomic History  . Marital status: Married    Spouse name: Not on file  . Number of children: Not on file  . Years of education: Not on  file  . Highest education level: Not on file  Occupational History  . Not on file  Social Needs  . Financial resource strain: Not on file  . Food insecurity:    Worry: Not on file    Inability: Not on file  . Transportation needs:    Medical: Not on file    Non-medical: Not on file  Tobacco Use  . Smoking status: Former Smoker    Last attempt to quit: 01/31/1985    Years since quitting: 32.6  . Smokeless tobacco: Never Used  . Tobacco comment: smoked  [place], up to 2.5 ppd  Substance and Sexual Activity  . Alcohol use: No    Comment:  none since 2012  . Drug use: No  . Sexual activity: Not on file  Lifestyle  . Physical activity:    Days per week: Not on file    Minutes per session: Not on file  . Stress: Not on file  Relationships  . Social connections:    Talks on phone: Not on file    Gets together: Not on file    Attends religious service: Not on file    Active member of club or organization: Not on file    Attends meetings of clubs or organizations: Not on file    Relationship status: Not on file  Other Topics Concern  . Not on file  Social History Narrative   Exercises regularly - gym 5 days a week    Family History  Problem Relation Age of Onset  . Diabetes Mother   . Dementia Mother   . Diabetes Sister   . Lung cancer Unknown        maternal uncle & aunt  . Colon cancer Neg Hx   . Stomach cancer Neg Hx   . Arthritis Neg Hx   . Heart disease Neg Hx   . Stroke Neg Hx   . Esophageal cancer Neg Hx   . Liver cancer Neg Hx   . Pancreatic cancer Neg Hx   . Rectal cancer Neg Hx     Review of Systems  Constitutional: Negative for chills and fever.  Respiratory: Negative for cough, shortness of breath and wheezing.   Cardiovascular: Negative for chest pain, palpitations and leg swelling.  Musculoskeletal: Positive for arthralgias and back pain.  Neurological: Negative for light-headedness and headaches.       Objective:   Vitals:   10/03/17  1508  BP: 124/84  Pulse:  90  Resp: 16  Temp: 98.4 F (36.9 C)  SpO2: 95%   BP Readings from Last 3 Encounters:  10/03/17 124/84  04/26/17 124/78  03/31/17 124/78   Wt Readings from Last 3 Encounters:  10/03/17 178 lb 6.4 oz (80.9 kg)  04/26/17 190 lb (86.2 kg)  04/12/17 190 lb 12.8 oz (86.5 kg)   Body mass index is 24.2 kg/m.   Physical Exam    Constitutional: Appears well-developed and well-nourished. No distress.  HENT:  Head: Normocephalic and atraumatic.  Neck: Neck supple. No tracheal deviation present. No thyromegaly present.  No cervical lymphadenopathy Cardiovascular: Normal rate, regular rhythm and normal heart sounds.   No murmur heard. No carotid bruit .  No edema Pulmonary/Chest: Effort normal and breath sounds normal. No respiratory distress. No has no wheezes. No rales.  Skin: Skin is warm and dry. Not diaphoretic.  Psychiatric: Normal mood and affect. Behavior is normal.      Assessment & Plan:    See Problem List for Assessment and Plan of chronic medical problems.

## 2017-10-03 ENCOUNTER — Encounter: Payer: Self-pay | Admitting: Internal Medicine

## 2017-10-03 ENCOUNTER — Ambulatory Visit (INDEPENDENT_AMBULATORY_CARE_PROVIDER_SITE_OTHER): Payer: Medicare Other | Admitting: Internal Medicine

## 2017-10-03 VITALS — BP 124/84 | HR 90 | Temp 98.4°F | Resp 16 | Ht 72.0 in | Wt 178.4 lb

## 2017-10-03 DIAGNOSIS — N183 Chronic kidney disease, stage 3 unspecified: Secondary | ICD-10-CM

## 2017-10-03 DIAGNOSIS — Z8739 Personal history of other diseases of the musculoskeletal system and connective tissue: Secondary | ICD-10-CM

## 2017-10-03 DIAGNOSIS — Z23 Encounter for immunization: Secondary | ICD-10-CM | POA: Diagnosis not present

## 2017-10-03 DIAGNOSIS — M25512 Pain in left shoulder: Secondary | ICD-10-CM | POA: Diagnosis not present

## 2017-10-03 DIAGNOSIS — R7303 Prediabetes: Secondary | ICD-10-CM

## 2017-10-03 DIAGNOSIS — E7849 Other hyperlipidemia: Secondary | ICD-10-CM

## 2017-10-03 DIAGNOSIS — M545 Low back pain, unspecified: Secondary | ICD-10-CM

## 2017-10-03 DIAGNOSIS — G8929 Other chronic pain: Secondary | ICD-10-CM | POA: Diagnosis not present

## 2017-10-03 DIAGNOSIS — M25511 Pain in right shoulder: Secondary | ICD-10-CM

## 2017-10-03 MED ORDER — FENOFIBRATE 145 MG PO TABS: 145.0000 mg | ORAL_TABLET | Freq: Every day | ORAL | 1 refills | Status: DC

## 2017-10-03 MED ORDER — ALLOPURINOL 300 MG PO TABS: 300.0000 mg | ORAL_TABLET | Freq: Every day | ORAL | 1 refills | Status: DC

## 2017-10-03 NOTE — Assessment & Plan Note (Signed)
Following with orthopedics Takes Tylenol as needed More severe pain would like to take tramadol as needed Okay to take tramadol as needed-I will continue to prescribe

## 2017-10-03 NOTE — Patient Instructions (Addendum)
   Flu immunization administered today.    Medications reviewed and updated.  No changes recommended at this time.    Please followup in 6 months   

## 2017-10-03 NOTE — Assessment & Plan Note (Signed)
No gout symptoms Continue allopurinol 300 mg daily

## 2017-10-03 NOTE — Assessment & Plan Note (Signed)
A1c 5.9% He is exercising regularly He is also eating a very low carb/sugar diet Continue above Follow-up in 6 months

## 2017-10-03 NOTE — Assessment & Plan Note (Signed)
Lipids well controlled with fenofibrate Continue regular exercise Continue healthy diet

## 2017-10-03 NOTE — Assessment & Plan Note (Signed)
Chronic in nature Has seen Dr. Tamala Julian in Tylenol as needed Takes tramadol for more severe pain Continue tramadol as needed

## 2017-10-03 NOTE — Assessment & Plan Note (Signed)
Had blood work done already-GFR slightly improved Continue to avoid NSAIDs Continue good water intake

## 2017-11-08 ENCOUNTER — Encounter: Payer: Self-pay | Admitting: Internal Medicine

## 2017-11-15 DIAGNOSIS — M5136 Other intervertebral disc degeneration, lumbar region: Secondary | ICD-10-CM | POA: Diagnosis not present

## 2017-11-15 DIAGNOSIS — M5030 Other cervical disc degeneration, unspecified cervical region: Secondary | ICD-10-CM | POA: Diagnosis not present

## 2017-11-15 DIAGNOSIS — M2241 Chondromalacia patellae, right knee: Secondary | ICD-10-CM | POA: Diagnosis not present

## 2017-12-05 ENCOUNTER — Encounter: Payer: Self-pay | Admitting: Internal Medicine

## 2017-12-06 MED ORDER — TRAMADOL HCL 50 MG PO TABS: 50.0000 mg | ORAL_TABLET | Freq: Every day | ORAL | 0 refills | Status: DC

## 2017-12-06 NOTE — Addendum Note (Signed)
Addended by: Binnie Rail on: 12/06/2017 01:10 PM   Modules accepted: Orders

## 2017-12-12 ENCOUNTER — Encounter: Payer: Self-pay | Admitting: Internal Medicine

## 2017-12-13 DIAGNOSIS — M545 Low back pain: Secondary | ICD-10-CM | POA: Diagnosis not present

## 2018-01-03 DIAGNOSIS — L309 Dermatitis, unspecified: Secondary | ICD-10-CM | POA: Diagnosis not present

## 2018-01-03 DIAGNOSIS — D2362 Other benign neoplasm of skin of left upper limb, including shoulder: Secondary | ICD-10-CM | POA: Diagnosis not present

## 2018-01-03 DIAGNOSIS — L570 Actinic keratosis: Secondary | ICD-10-CM | POA: Diagnosis not present

## 2018-01-03 DIAGNOSIS — L814 Other melanin hyperpigmentation: Secondary | ICD-10-CM | POA: Diagnosis not present

## 2018-01-03 DIAGNOSIS — L821 Other seborrheic keratosis: Secondary | ICD-10-CM | POA: Diagnosis not present

## 2018-01-04 DIAGNOSIS — M25511 Pain in right shoulder: Secondary | ICD-10-CM | POA: Diagnosis not present

## 2018-01-04 DIAGNOSIS — M545 Low back pain: Secondary | ICD-10-CM | POA: Diagnosis not present

## 2018-01-04 DIAGNOSIS — M25512 Pain in left shoulder: Secondary | ICD-10-CM | POA: Diagnosis not present

## 2018-01-04 DIAGNOSIS — M25561 Pain in right knee: Secondary | ICD-10-CM | POA: Diagnosis not present

## 2018-01-09 DIAGNOSIS — R972 Elevated prostate specific antigen [PSA]: Secondary | ICD-10-CM | POA: Diagnosis not present

## 2018-01-09 DIAGNOSIS — C61 Malignant neoplasm of prostate: Secondary | ICD-10-CM | POA: Diagnosis not present

## 2018-01-11 DIAGNOSIS — M25561 Pain in right knee: Secondary | ICD-10-CM | POA: Diagnosis not present

## 2018-01-11 DIAGNOSIS — M25511 Pain in right shoulder: Secondary | ICD-10-CM | POA: Diagnosis not present

## 2018-01-11 DIAGNOSIS — M545 Low back pain: Secondary | ICD-10-CM | POA: Diagnosis not present

## 2018-01-11 DIAGNOSIS — M25512 Pain in left shoulder: Secondary | ICD-10-CM | POA: Diagnosis not present

## 2018-02-12 DIAGNOSIS — C61 Malignant neoplasm of prostate: Secondary | ICD-10-CM | POA: Diagnosis not present

## 2018-02-12 DIAGNOSIS — Z9079 Acquired absence of other genital organ(s): Secondary | ICD-10-CM | POA: Diagnosis not present

## 2018-02-12 DIAGNOSIS — R972 Elevated prostate specific antigen [PSA]: Secondary | ICD-10-CM | POA: Diagnosis not present

## 2018-02-12 DIAGNOSIS — N529 Male erectile dysfunction, unspecified: Secondary | ICD-10-CM | POA: Diagnosis not present

## 2018-03-13 DIAGNOSIS — L02612 Cutaneous abscess of left foot: Secondary | ICD-10-CM | POA: Diagnosis not present

## 2018-03-13 DIAGNOSIS — L03032 Cellulitis of left toe: Secondary | ICD-10-CM | POA: Diagnosis not present

## 2018-03-13 DIAGNOSIS — M79675 Pain in left toe(s): Secondary | ICD-10-CM | POA: Diagnosis not present

## 2018-03-24 ENCOUNTER — Encounter: Payer: Self-pay | Admitting: Internal Medicine

## 2018-03-24 DIAGNOSIS — N183 Chronic kidney disease, stage 3 unspecified: Secondary | ICD-10-CM

## 2018-03-24 DIAGNOSIS — E7849 Other hyperlipidemia: Secondary | ICD-10-CM

## 2018-03-24 DIAGNOSIS — R7303 Prediabetes: Secondary | ICD-10-CM

## 2018-03-24 DIAGNOSIS — Z8739 Personal history of other diseases of the musculoskeletal system and connective tissue: Secondary | ICD-10-CM

## 2018-04-04 ENCOUNTER — Other Ambulatory Visit (INDEPENDENT_AMBULATORY_CARE_PROVIDER_SITE_OTHER): Payer: Medicare Other

## 2018-04-04 DIAGNOSIS — N183 Chronic kidney disease, stage 3 unspecified: Secondary | ICD-10-CM

## 2018-04-04 DIAGNOSIS — R7303 Prediabetes: Secondary | ICD-10-CM | POA: Diagnosis not present

## 2018-04-04 DIAGNOSIS — Z8739 Personal history of other diseases of the musculoskeletal system and connective tissue: Secondary | ICD-10-CM

## 2018-04-04 DIAGNOSIS — E7849 Other hyperlipidemia: Secondary | ICD-10-CM

## 2018-04-04 LAB — LIPID PANEL
Cholesterol: 164 mg/dL (ref 0–200)
HDL: 62.7 mg/dL (ref >39.00)
LDL Cholesterol: 87 mg/dL (ref 0–99)
NonHDL: 101.73
Total CHOL/HDL Ratio: 3
Triglycerides: 75 mg/dL (ref 0.0–149.0)
VLDL: 15 mg/dL (ref 0.0–40.0)

## 2018-04-04 LAB — COMPREHENSIVE METABOLIC PANEL
ALBUMIN: 4.5 g/dL (ref 3.5–5.2)
ALT: 23 U/L (ref 0–53)
AST: 37 U/L (ref 0–37)
Alkaline Phosphatase: 52 U/L (ref 39–117)
BUN: 29 mg/dL — AB (ref 6–23)
CO2: 30 mEq/L (ref 19–32)
Calcium: 10.1 mg/dL (ref 8.4–10.5)
Chloride: 103 mEq/L (ref 96–112)
Creatinine, Ser: 1.55 mg/dL — ABNORMAL HIGH (ref 0.40–1.50)
GFR: 44.18 mL/min — ABNORMAL LOW (ref >60.00)
Glucose, Bld: 97 mg/dL (ref 70–99)
Potassium: 4.2 mEq/L (ref 3.5–5.1)
SODIUM: 140 meq/L (ref 135–145)
Total Bilirubin: 0.5 mg/dL (ref 0.2–1.2)
Total Protein: 7.3 g/dL (ref 6.0–8.3)

## 2018-04-04 LAB — CBC WITH DIFFERENTIAL/PLATELET
Basophils Absolute: 0 10*3/uL (ref 0.0–0.1)
Basophils Relative: 0.8 % (ref 0.0–3.0)
Eosinophils Absolute: 0.1 10*3/uL (ref 0.0–0.7)
Eosinophils Relative: 2.5 % (ref 0.0–5.0)
HCT: 43.3 % (ref 39.0–52.0)
HEMOGLOBIN: 14.7 g/dL (ref 13.0–17.0)
Lymphocytes Relative: 13.3 % (ref 12.0–46.0)
Lymphs Abs: 0.7 10*3/uL (ref 0.7–4.0)
MCHC: 34 g/dL (ref 30.0–36.0)
MCV: 93.1 fl (ref 78.0–100.0)
MONO ABS: 0.8 10*3/uL (ref 0.1–1.0)
Monocytes Relative: 14.4 % — ABNORMAL HIGH (ref 3.0–12.0)
Neutro Abs: 3.6 10*3/uL (ref 1.4–7.7)
Neutrophils Relative %: 69 % (ref 43.0–77.0)
Platelets: 213 10*3/uL (ref 150.0–400.0)
RBC: 4.65 Mil/uL (ref 4.22–5.81)
RDW: 15 % (ref 11.5–15.5)
WBC: 5.2 10*3/uL (ref 4.0–10.5)

## 2018-04-04 LAB — URIC ACID: Uric Acid, Serum: 4.6 mg/dL (ref 4.0–7.8)

## 2018-04-04 LAB — HEMOGLOBIN A1C: Hgb A1c MFr Bld: 5.7 % (ref 4.6–6.5)

## 2018-04-04 LAB — TSH: TSH: 0.85 u[IU]/mL (ref 0.35–4.50)

## 2018-04-05 NOTE — Progress Notes (Signed)
Subjective:    Patient ID: Tom Gaines, male    DOB: [birth year], 73 y.o.   MRN: [id number]  HPI The patient is here for follow up.  Recent cold symptoms.  He slept a lot which is unusual for him.  His symptoms have improved.  He denies any significant residual cold symptoms.  Prediabetes:  He is compliant with a low sugar/carbohydrate diet.  He is exercising regularly.  Hyperlipidemia: He is taking his medication daily. He is compliant with a low fat/cholesterol diet. He is exercising regularly. He denies myalgias.   CKD: He does not take any NSAIDs.  He takes Tylenol if needed for pain or tramadol.  Gout:  He takes allopurinol daily.  He denies any gout symptoms since he was here last.  Lower back pain, b/l shoulder pain:  She sees dr Lynann Bologna.  He takes tylenol and tramadol as needed, which is less often.  He has done PT and that has helped.  He is doing exercises daily.      Medications and allergies reviewed with patient and updated if appropriate.  Patient Active Problem List   Diagnosis Date Noted  . Bilateral shoulder pain 10/03/2017  . CKD (chronic kidney disease) 03/31/2017  . Bilateral shoulder bursitis 02/01/2017  . Acute shoulder bursitis, right 05/17/2016  . Prediabetes 03/29/2016  . Nonallopathic lesion of lumbosacral region 12/01/2015  . Nonallopathic lesion of sacral region 12/01/2015  . Nonallopathic lesion of thoracic region 12/01/2015  . Chest wall discomfort 11/20/2015  . Low back pain 03/13/2014  . Basal cell carcinoma 02/13/2013  . Hyperlipidemia 03/12/2008  . History of gout 03/12/2008  . PROSTATE CANCER, HX OF 03/12/2008  . History of colonic polyps 03/12/2008    Current Outpatient Medications on File Prior to Visit  Medication Sig Dispense Refill  . acetaminophen (TYLENOL) 500 MG tablet Take 500 mg by mouth every 6 (six) hours as needed.    Marland Kitchen acyclovir (ZOVIRAX) 800 MG tablet Take 400 mg by mouth 2 (two) times daily.     Marland Kitchen allopurinol  (ZYLOPRIM) 300 MG tablet Take 1 tablet (300 mg total) by mouth daily. 90 tablet 1  . Ascorbic Acid (VITAMIN C) 100 MG tablet Take 100 mg by mouth daily.    . cetirizine (ZYRTEC) 10 MG tablet Take 10 mg by mouth as needed.     . Cholecalciferol (VITAMIN D) 2000 units CAPS Take 1 capsule by mouth daily.    . fenofibrate (TRICOR) 145 MG tablet Take 1 tablet (145 mg total) by mouth daily. 90 tablet 1  . fluticasone (FLONASE) 50 MCG/ACT nasal spray USE 1 SPRAY NASALLY TWICE A DAY AS NEEDED 48 g 1  . Folic Acid-Vit Q0-GQQ P61 (FA-VITAMIN B-6-VITAMIN B-12 PO) Take by mouth daily.    . Misc Natural Products (TART CHERRY ADVANCED PO) Take 1 capsule by mouth daily.    . Multiple Vitamin (MULTIVITAMIN) tablet Take 1 tablet by mouth daily.      . Omega-3 Fatty Acids (FISH OIL) 1000 MG CAPS Take 1,000 mg by mouth. 1 by mouth daily    . traMADol (ULTRAM) 50 MG tablet Take 1 tablet (50 mg total) by mouth daily. For chronic back pain 90 tablet 0  . Turmeric Curcumin 500 MG CAPS Take 500 mg by mouth 3 (three) times daily.      No current facility-administered medications on file prior to visit.     Past Medical History:  Diagnosis Date  . Allergy   . Arthritis   .  Cataract   . Gout   . Prostate cancer ([place]) 1998    Past Surgical History:  Procedure Laterality Date  . CATARACT EXTRACTION, BILATERAL  2017  . colonoscopy with polypectomy  2014   Dr Fuller Plan  . LUMBAR DISC SURGERY  2002   Dr Ellene Route  . PROSTATECTOMY  1998  . SHOULDER ARTHROSCOPY     right; 2011; left 1990  . TONSILLECTOMY AND ADENOIDECTOMY      Social History   Socioeconomic History  . Marital status: Married    Spouse name: Not on file  . Number of children: Not on file  . Years of education: Not on file  . Highest education level: Not on file  Occupational History  . Not on file  Social Needs  . Financial resource strain: Not on file  . Food insecurity:    Worry: Not on file    Inability: Not on file  . Transportation  needs:    Medical: Not on file    Non-medical: Not on file  Tobacco Use  . Smoking status: Former Smoker    Last attempt to quit: 01/31/1985    Years since quitting: 33.2  . Smokeless tobacco: Never Used  . Tobacco comment: smoked  [place], up to 2.5 ppd  Substance and Sexual Activity  . Alcohol use: No    Comment:  none since 2012  . Drug use: No  . Sexual activity: Not on file  Lifestyle  . Physical activity:    Days per week: Not on file    Minutes per session: Not on file  . Stress: Not on file  Relationships  . Social connections:    Talks on phone: Not on file    Gets together: Not on file    Attends religious service: Not on file    Active member of club or organization: Not on file    Attends meetings of clubs or organizations: Not on file    Relationship status: Not on file  Other Topics Concern  . Not on file  Social History Narrative   Exercises regularly - gym 5 days a week    Family History  Problem Relation Age of Onset  . Diabetes Mother   . Dementia Mother   . Diabetes Sister   . Lung cancer Unknown        maternal uncle & aunt  . Colon cancer Neg Hx   . Stomach cancer Neg Hx   . Arthritis Neg Hx   . Heart disease Neg Hx   . Stroke Neg Hx   . Esophageal cancer Neg Hx   . Liver cancer Neg Hx   . Pancreatic cancer Neg Hx   . Rectal cancer Neg Hx     Review of Systems  Constitutional: Negative for chills and fever.  HENT: Positive for congestion (mild).   Respiratory: Negative for cough, shortness of breath and wheezing.   Cardiovascular: Negative for chest pain, palpitations and leg swelling.  Neurological: Negative for light-headedness and headaches.       Objective:   Vitals:   04/06/18 1359  BP: 118/70  Pulse: 81  Resp: 16  Temp: 98.3 F (36.8 C)  SpO2: 94%   BP Readings from Last 3 Encounters:  04/06/18 118/70  10/03/17 124/84  04/26/17 124/78   Wt Readings from Last 3 Encounters:  04/06/18 177 lb 6.4 oz (80.5 kg)    10/03/17 178 lb 6.4 oz (80.9 kg)  04/26/17 190 lb (86.2 kg)  Body mass index is 24.06 kg/m.   Physical Exam    Constitutional: Appears well-developed and well-nourished. No distress.  HENT:  Head: Normocephalic and atraumatic.  Neck: Neck supple. No tracheal deviation present. No thyromegaly present.  No cervical lymphadenopathy Cardiovascular: Normal rate, regular rhythm and normal heart sounds.   No murmur heard. No carotid bruit .  No edema Pulmonary/Chest: Effort normal and breath sounds normal. No respiratory distress. No has no wheezes. No rales.  Skin: Skin is warm and dry. Not diaphoretic.  Psychiatric: Normal mood and affect. Behavior is normal.      Assessment & Plan:    See Problem List for Assessment and Plan of chronic medical problems.

## 2018-04-05 NOTE — Patient Instructions (Addendum)
  We reviewed your blood work.    Medications reviewed and updated.  Changes include :   none    Please followup in 6 months  

## 2018-04-06 ENCOUNTER — Encounter: Payer: Self-pay | Admitting: Internal Medicine

## 2018-04-06 ENCOUNTER — Ambulatory Visit: Payer: Medicare Other

## 2018-04-06 ENCOUNTER — Ambulatory Visit (INDEPENDENT_AMBULATORY_CARE_PROVIDER_SITE_OTHER): Payer: Medicare Other | Admitting: Internal Medicine

## 2018-04-06 VITALS — BP 118/70 | HR 81 | Temp 98.3°F | Resp 16 | Ht 72.0 in | Wt 177.4 lb

## 2018-04-06 DIAGNOSIS — N183 Chronic kidney disease, stage 3 unspecified: Secondary | ICD-10-CM

## 2018-04-06 DIAGNOSIS — M545 Low back pain: Secondary | ICD-10-CM | POA: Diagnosis not present

## 2018-04-06 DIAGNOSIS — E7849 Other hyperlipidemia: Secondary | ICD-10-CM | POA: Diagnosis not present

## 2018-04-06 DIAGNOSIS — R7303 Prediabetes: Secondary | ICD-10-CM

## 2018-04-06 DIAGNOSIS — G8929 Other chronic pain: Secondary | ICD-10-CM

## 2018-04-06 DIAGNOSIS — Z8739 Personal history of other diseases of the musculoskeletal system and connective tissue: Secondary | ICD-10-CM

## 2018-04-06 NOTE — Assessment & Plan Note (Signed)
Sugars very well controlled-A1c 5.7% Continue low sugar/carbohydrate diet and regular exercise

## 2018-04-06 NOTE — Assessment & Plan Note (Signed)
No gout symptoms since he was here last Continue allopurinol 300 mg daily, also taking tart cherry juice tablets

## 2018-04-06 NOTE — Assessment & Plan Note (Signed)
Chronic Following with Dr. Eliezer Lofts Tylenol most of the time Takes tramadol as needed, but not daily Did physical therapy with Andris Flurry and it helped significantly-doing the exercises daily

## 2018-04-06 NOTE — Assessment & Plan Note (Signed)
Lipids well controlled Continue fenofibrate Continue regular exercise

## 2018-04-06 NOTE — Assessment & Plan Note (Signed)
Kidney function stable Continue increase fluids Avoid NSAIDs

## 2018-04-09 ENCOUNTER — Encounter: Payer: Self-pay | Admitting: Internal Medicine

## 2018-04-09 DIAGNOSIS — N183 Chronic kidney disease, stage 3 unspecified: Secondary | ICD-10-CM

## 2018-04-09 DIAGNOSIS — R7303 Prediabetes: Secondary | ICD-10-CM

## 2018-04-09 NOTE — Progress Notes (Addendum)
Subjective:   Tom Gaines is a 73 y.o. male who presents for Medicare Annual/Subsequent preventive examination.  Review of Systems:  No ROS.  Medicare Wellness Visit. Additional risk factors are reflected in the social history.    Sleep patterns: feels rested on waking, gets up 1-3 times nightly to void and sleeps 8 hours nightly.    Home Safety/Smoke Alarms: Feels safe in home. Smoke alarms in place.  Living environment; residence and Firearm Safety: 2-story house. Lives with wife, no needs for DME, good support system Seat Belt Safety/Bike Helmet: Wears seat belt.   PSA-  Lab Results  Component Value Date   PSA 0.60 03/01/2010   PSA 0.68 09/17/2009   PSA 0.68 03/16/2009       Objective:    Vitals: There were no vitals taken for this visit.  There is no height or weight on file to calculate BMI.  Advanced Directives 04/26/2017 03/31/2017 07/28/2015 03/21/2014  Does Patient Have a Medical Advance Directive? No Yes Yes No;Yes  Type of Advance Directive - [place];Living will - [place];Living will;Out of facility DNR (pink MOST or yellow form)  Copy of [place] in Chart? - No - copy requested No - copy requested No - copy requested    Tobacco Social History   Tobacco Use  Smoking Status Former Smoker  . Last attempt to quit: 01/31/1985  . Years since quitting: 33.2  Smokeless Tobacco Never Used  Tobacco Comment   smoked  [place], up to 2.5 ppd     Counseling given: Not Answered Comment: smoked  [place], up to 2.5 ppd  Past Medical History:  Diagnosis Date  . Allergy   . Arthritis   . Cataract   . Gout   . Prostate cancer ([place]) 1998   Past Surgical History:  Procedure Laterality Date  . CATARACT EXTRACTION, BILATERAL  2017  . colonoscopy with polypectomy  2014   Dr Fuller Plan  . LUMBAR DISC SURGERY  2002   Dr Ellene Route  . PROSTATECTOMY  1998  . SHOULDER ARTHROSCOPY     right; 2011; left 1990  .  TONSILLECTOMY AND ADENOIDECTOMY     Family History  Problem Relation Age of Onset  . Diabetes Mother   . Dementia Mother   . Diabetes Sister   . Lung cancer Unknown        maternal uncle & aunt  . Colon cancer Neg Hx   . Stomach cancer Neg Hx   . Arthritis Neg Hx   . Heart disease Neg Hx   . Stroke Neg Hx   . Esophageal cancer Neg Hx   . Liver cancer Neg Hx   . Pancreatic cancer Neg Hx   . Rectal cancer Neg Hx    Social History   Socioeconomic History  . Marital status: Married    Spouse name: Not on file  . Number of children: Not on file  . Years of education: Not on file  . Highest education level: Not on file  Occupational History  . Not on file  Social Needs  . Financial resource strain: Not on file  . Food insecurity:    Worry: Not on file    Inability: Not on file  . Transportation needs:    Medical: Not on file    Non-medical: Not on file  Tobacco Use  . Smoking status: Former Smoker    Last attempt to quit: 01/31/1985  Years since quitting: 33.2  . Smokeless tobacco: Never Used  . Tobacco comment: smoked  [place], up to 2.5 ppd  Substance and Sexual Activity  . Alcohol use: No    Comment:  none since 2012  . Drug use: No  . Sexual activity: Not on file  Lifestyle  . Physical activity:    Days per week: Not on file    Minutes per session: Not on file  . Stress: Not on file  Relationships  . Social connections:    Talks on phone: Not on file    Gets together: Not on file    Attends religious service: Not on file    Active member of club or organization: Not on file    Attends meetings of clubs or organizations: Not on file    Relationship status: Not on file  Other Topics Concern  . Not on file  Social History Narrative   Exercises regularly - gym 5 days a week    Outpatient Encounter Medications as of 04/10/2018  Medication Sig  . acetaminophen (TYLENOL) 500 MG tablet Take 500 mg by mouth every 6 (six) hours as needed.  Marland Kitchen acyclovir  (ZOVIRAX) 800 MG tablet Take 400 mg by mouth 2 (two) times daily.   Marland Kitchen allopurinol (ZYLOPRIM) 300 MG tablet Take 1 tablet (300 mg total) by mouth daily.  . Ascorbic Acid (VITAMIN C) 100 MG tablet Take 100 mg by mouth daily.  . cetirizine (ZYRTEC) 10 MG tablet Take 10 mg by mouth as needed.   . Cholecalciferol (VITAMIN D) 2000 units CAPS Take 1 capsule by mouth daily.  . fenofibrate (TRICOR) 145 MG tablet Take 1 tablet (145 mg total) by mouth daily.  . fluticasone (FLONASE) 50 MCG/ACT nasal spray USE 1 SPRAY NASALLY TWICE A DAY AS NEEDED  . Folic Acid-Vit X3-KGM W10 (FA-VITAMIN B-6-VITAMIN B-12 PO) Take by mouth daily.  . Misc Natural Products (TART CHERRY ADVANCED PO) Take 1 capsule by mouth daily.  . Multiple Vitamin (MULTIVITAMIN) tablet Take 1 tablet by mouth daily.    . Omega-3 Fatty Acids (FISH OIL) 1000 MG CAPS Take 1,000 mg by mouth. 1 by mouth daily  . traMADol (ULTRAM) 50 MG tablet Take 1 tablet (50 mg total) by mouth daily. For chronic back pain  . Turmeric Curcumin 500 MG CAPS Take 500 mg by mouth 3 (three) times daily.    No facility-administered encounter medications on file as of 04/10/2018.     Activities of Daily Living No flowsheet data found.  Patient Care Team: Binnie Rail, MD as PCP - General (Internal Medicine)   Assessment:   This is a routine wellness examination for Tom Gaines. Physical assessment deferred to PCP.  Exercise Activities and Dietary recommendations   Diet (meal preparation, eat out, water intake, caffeinated beverages, dairy products, fruits and vegetables): in general, a "healthy" diet  , well balanced. Diet education was attached to patient's AVS.  Goals    . Patient Stated     Continue to enjoy life and family       Fall Risk Fall Risk  03/31/2017 07/08/2016 03/30/2015 02/17/2014 02/13/2013  Falls in the past year? No No No No No    Depression Screen PHQ 2/9 Scores 03/31/2017 07/08/2016 03/30/2015 02/17/2014  PHQ - 2 Score 0 0 0 0  PHQ- 9 Score 0  - - -    Cognitive Function       Ad8 score reviewed for issues:  Issues making decisions: no  Less interest in hobbies / activities: no  Repeats questions, stories (family complaining): no  Trouble using ordinary gadgets (microwave, computer, phone):no  Forgets the month or year: no  Mismanaging finances: no  Remembering appts: no  Daily problems with thinking and/or memory: no Ad8 score is= 0  Immunization History  Administered Date(s) Administered  . Influenza Whole 11-Nov-1945, 10/08/2008, 11/08/2011  . Influenza, High Dose Seasonal PF 10/22/2012, 11/20/2015, 10/03/2017  . Influenza,inj,Quad PF,6+ Mos 10/10/2014  . Influenza-Unspecified 11/14/2016  . Pneumococcal Conjugate-13 10/10/2014  . Pneumococcal Polysaccharide-23 02/13/2013  . Td 03/12/2008  . Zoster 05/30/2006  . Zoster Recombinat (Shingrix) 05/27/2016   Screening Tests Health Maintenance  Topic Date Due  . TETANUS/TDAP  03/12/2018  . COLONOSCOPY  04/27/2022  . INFLUENZA VACCINE  Completed  . Hepatitis C Screening  Completed  . PNA vac Low Risk Adult  Completed       Plan:     Reviewed health maintenance screenings with patient today and relevant education, vaccines, and/or referrals were provided.   Continue doing brain stimulating activities (puzzles, reading, adult coloring books, staying active) to keep memory sharp.   Continue to eat heart healthy diet (full of fruits, vegetables, whole grains, lean protein, water--limit salt, fat, and sugar intake) and increase physical activity as tolerated.  I have personally reviewed and noted the following in the patient's chart:   . Medical and social history . Use of alcohol, tobacco or illicit drugs  . Current medications and supplements . Functional ability and status . Nutritional status . Physical activity . Advanced directives . List of other physicians . Vitals . Screenings to include cognitive, depression, and falls . Referrals and  appointments  In addition, I have reviewed and discussed with patient certain preventive protocols, quality metrics, and best practice recommendations. A written personalized care plan for preventive services as well as general preventive health recommendations were provided to patient.     Michiel Cowboy, RN  04/09/2018    Medical screening examination/treatment/procedure(s) were performed by non-physician practitioner and as supervising physician I was immediately available for consultation/collaboration. I agree with above. Binnie Rail, MD

## 2018-04-10 ENCOUNTER — Ambulatory Visit (INDEPENDENT_AMBULATORY_CARE_PROVIDER_SITE_OTHER): Payer: Medicare Other | Admitting: *Deleted

## 2018-04-10 VITALS — BP 116/72 | HR 54 | Resp 16 | Ht 72.0 in | Wt 180.0 lb

## 2018-04-10 DIAGNOSIS — Z0000 Encounter for general adult medical examination without abnormal findings: Secondary | ICD-10-CM | POA: Diagnosis not present

## 2018-04-10 NOTE — Patient Instructions (Signed)
Continue doing brain stimulating activities (puzzles, reading, adult coloring books, staying active) to keep memory sharp.   Continue to eat heart healthy diet (full of fruits, vegetables, whole grains, lean protein, water--limit salt, fat, and sugar intake) and increase physical activity as tolerated.   Tom Gaines , Thank you for taking time to come for your Medicare Wellness Visit. I appreciate your ongoing commitment to your health goals. Please review the following plan we discussed and let me know if I can assist you in the future.   These are the goals we discussed: Goals    . Patient Stated     Continue to enjoy life and family    . Patient Stated     Maintain current health status by continuing eat healthy and stay physically and socially active.       This is a list of the screening recommended for you and due dates:  Health Maintenance  Topic Date Due  . Tetanus Vaccine  03/12/2018  . Colon Cancer Screening  04/27/2022  . Flu Shot  Completed  .  Hepatitis C: One time screening is recommended by Center for Disease Control  (CDC) for  adults born from [birth year] through 1965.   Completed  . Pneumonia vaccines  Completed    Preventive Care 54 Years and Older, Male Preventive care refers to lifestyle choices and visits with your health care provider that can promote health and wellness. What does preventive care include?   A yearly physical exam. This is also called an annual well check.  Dental exams once or twice a year.  Routine eye exams. Ask your health care provider how often you should have your eyes checked.  Personal lifestyle choices, including: ? Daily care of your teeth and gums. ? Regular physical activity. ? Eating a healthy diet. ? Avoiding tobacco and drug use. ? Limiting alcohol use. ? Practicing safe sex. ? Taking low doses of aspirin every day. ? Taking vitamin and mineral supplements as recommended by your health care provider. What happens during an  annual well check? The services and screenings done by your health care provider during your annual well check will depend on your age, overall health, lifestyle risk factors, and family history of disease. Counseling Your health care provider may ask you questions about your:  Alcohol use.  Tobacco use.  Drug use.  Emotional well-being.  Home and relationship well-being.  Sexual activity.  Eating habits.  History of falls.  Memory and ability to understand (cognition).  Work and work Statistician. Screening You may have the following tests or measurements:  Height, weight, and BMI.  Blood pressure.  Lipid and cholesterol levels. These may be checked every 5 years, or more frequently if you are over 23 years old.  Skin check.  Lung cancer screening. You may have this screening every year starting at age 109 if you have a 30-pack-year history of smoking and currently smoke or have quit within the past 15 years.  Colorectal cancer screening. All adults should have this screening starting at age 16 and continuing until age 24. You will have tests every 1-10 years, depending on your results and the type of screening test. People at increased risk should start screening at an earlier age. Screening tests may include: ? Guaiac-based fecal occult blood testing. ? Fecal immunochemical test (FIT). ? Stool DNA test. ? Virtual colonoscopy. ? Sigmoidoscopy. During this test, a flexible tube with a tiny camera (sigmoidoscope) is used to examine your rectum and  lower colon. The sigmoidoscope is inserted through your anus into your rectum and lower colon. ? Colonoscopy. During this test, a long, thin, flexible tube with a tiny camera (colonoscope) is used to examine your entire colon and rectum.  Prostate cancer screening. Recommendations will vary depending on your family history and other risks.  Hepatitis C blood test.  Hepatitis B blood test.  Sexually transmitted disease (STD)  testing.  Diabetes screening. This is done by checking your blood sugar (glucose) after you have not eaten for a while (fasting). You may have this done every 1-3 years.  Abdominal aortic aneurysm (AAA) screening. You may need this if you are a current or former smoker.  Osteoporosis. You may be screened starting at age 75 if you are at high risk. Talk with your health care provider about your test results, treatment options, and if necessary, the need for more tests. Vaccines Your health care provider may recommend certain vaccines, such as:  Influenza vaccine. This is recommended every year.  Tetanus, diphtheria, and acellular pertussis (Tdap, Td) vaccine. You may need a Td booster every 10 years.  Varicella vaccine. You may need this if you have not been vaccinated.  Zoster vaccine. You may need this after age 34.  Measles, mumps, and rubella (MMR) vaccine. You may need at least one dose of MMR if you were born in 1957 or later. You may also need a second dose.  Pneumococcal 13-valent conjugate (PCV13) vaccine. One dose is recommended after age 73.  Pneumococcal polysaccharide (PPSV23) vaccine. One dose is recommended after age 47.  Meningococcal vaccine. You may need this if you have certain conditions.  Hepatitis A vaccine. You may need this if you have certain conditions or if you travel or work in places where you may be exposed to hepatitis A.  Hepatitis B vaccine. You may need this if you have certain conditions or if you travel or work in places where you may be exposed to hepatitis B.  Haemophilus influenzae type b (Hib) vaccine. You may need this if you have certain risk factors. Talk to your health care provider about which screenings and vaccines you need and how often you need them. This information is not intended to replace advice given to you by your health care provider. Make sure you discuss any questions you have with your health care provider. Document  Released: 02/13/2015 Document Revised: 03/09/2017 Document Reviewed: 11/18/2014 Elsevier Interactive Patient Education  2019 [employer].

## 2018-04-11 ENCOUNTER — Telehealth: Payer: Self-pay | Admitting: *Deleted

## 2018-04-11 ENCOUNTER — Other Ambulatory Visit: Payer: Self-pay | Admitting: Internal Medicine

## 2018-04-11 NOTE — Telephone Encounter (Signed)
During AWV, patient asked about his stage 3 kidney disease. He questioned what he could do to keep his kidneys as healthy as possible and asked what should he know regarding his stage 3 disease.

## 2018-04-12 NOTE — Telephone Encounter (Signed)
Dicussed with Sharee Pimple and she gave him the necessary information

## 2018-04-16 ENCOUNTER — Telehealth: Payer: Self-pay | Admitting: *Deleted

## 2018-04-16 NOTE — Telephone Encounter (Signed)
Called and spoke to patient regarding his request for education about his Stage 3 kidney disease. Explained that nurse spoke to Dr. Quay Burow and PCP feels that patient has kept his kidney disease stable by his healthy life-style. It was stated that following a renal diet would be beneficial at this time. However, with the control the patient has maintained with his kidney disease, PCP felt that he did not need to be extremely strict with this diet at this time. Nurse explained she will send education regarding kidney disease and kidney disease diet via e-mail and mail.

## 2018-04-25 ENCOUNTER — Other Ambulatory Visit: Payer: Self-pay | Admitting: Internal Medicine

## 2018-06-26 DIAGNOSIS — H11132 Conjunctival pigmentations, left eye: Secondary | ICD-10-CM | POA: Diagnosis not present

## 2018-06-26 DIAGNOSIS — Z961 Presence of intraocular lens: Secondary | ICD-10-CM | POA: Diagnosis not present

## 2018-06-26 DIAGNOSIS — D3131 Benign neoplasm of right choroid: Secondary | ICD-10-CM | POA: Diagnosis not present

## 2018-06-26 DIAGNOSIS — H31091 Other chorioretinal scars, right eye: Secondary | ICD-10-CM | POA: Diagnosis not present

## 2018-08-09 DIAGNOSIS — C61 Malignant neoplasm of prostate: Secondary | ICD-10-CM | POA: Diagnosis not present

## 2018-08-09 DIAGNOSIS — R972 Elevated prostate specific antigen [PSA]: Secondary | ICD-10-CM | POA: Diagnosis not present

## 2018-08-13 DIAGNOSIS — R9721 Rising PSA following treatment for malignant neoplasm of prostate: Secondary | ICD-10-CM | POA: Diagnosis not present

## 2018-08-13 DIAGNOSIS — N528 Other male erectile dysfunction: Secondary | ICD-10-CM | POA: Diagnosis not present

## 2018-08-16 ENCOUNTER — Encounter: Payer: Self-pay | Admitting: Internal Medicine

## 2018-08-26 NOTE — Progress Notes (Signed)
Subjective:    Patient ID: Tom Gaines, male    DOB: 11-25-1945, 73 y.o.   MRN: [id number]  HPI The patient is here for an acute visit.   Nasal soreness:  A couple of years ago he started using a neti pot.  His nose would be irritated after using it with either just water or salt and water.  He stopped using it for a long time and tried using it again recently, but again it caused irritation so he stopped.  He does have allergies and takes Zyrtec daily.  He also uses Flonase as needed.  He has not used Flonase for a couple of months.  He has a lot of mucus in the end of his nose near the tip.  Last week it was very sore on the left side, which is typically is more sore than the right side.  It felt like he had an ulcer in the tip.  It does feel better now.  On occasion he will have minimal bleeding that he sees on the Kleenex.  He was concerned about the soreness, especially given his history of smoking and wanted to make sure there was not anything more serious.  Finger cut: He cut his left middle finger 10 days ago.  He cut it with a knife and it did bleed initially.  It stop bleeding and he put a Band-Aid on it.  It does hurt, but it is not severe.  It was healing, but last week he did see some pus in the wound that was able to be extracted.  He has some mild redness around the cut and he just wanted to make sure it was healing okay.  It did open up a little bit.  He denies any significant pain, fevers or chills.    Medications and allergies reviewed with patient and updated if appropriate.  Patient Active Problem List   Diagnosis Date Noted  . Nasal sore 08/27/2018  . Cut of finger 08/27/2018  . Bilateral shoulder pain 10/03/2017  . CKD (chronic kidney disease) 03/31/2017  . Bilateral shoulder bursitis 02/01/2017  . Acute shoulder bursitis, right 05/17/2016  . Prediabetes 03/29/2016  . Nonallopathic lesion of lumbosacral region 12/01/2015  . Nonallopathic lesion of sacral region  12/01/2015  . Nonallopathic lesion of thoracic region 12/01/2015  . Chest wall discomfort 11/20/2015  . Low back pain 03/13/2014  . Basal cell carcinoma 02/13/2013  . Hyperlipidemia 03/12/2008  . History of gout 03/12/2008  . PROSTATE CANCER, HX OF 03/12/2008  . History of colonic polyps 03/12/2008    Current Outpatient Medications on File Prior to Visit  Medication Sig Dispense Refill  . acetaminophen (TYLENOL) 500 MG tablet Take 500 mg by mouth every 6 (six) hours as needed.    Marland Kitchen acyclovir (ZOVIRAX) 800 MG tablet Take 400 mg by mouth 2 (two) times daily.     Marland Kitchen allopurinol (ZYLOPRIM) 300 MG tablet TAKE 1 TABLET BY MOUTH  DAILY 90 tablet 1  . Ascorbic Acid (VITAMIN C) 100 MG tablet Take 100 mg by mouth daily.    . cetirizine (ZYRTEC) 10 MG tablet Take 10 mg by mouth as needed.     . Cholecalciferol (VITAMIN D) 2000 units CAPS Take 1 capsule by mouth daily.    . fenofibrate (TRICOR) 145 MG tablet TAKE 1 TABLET BY MOUTH  DAILY 90 tablet 1  . fluticasone (FLONASE) 50 MCG/ACT nasal spray USE 1 SPRAY NASALLY TWICE A DAY AS NEEDED 48 g 1  .  Folic Acid-Vit B3-ALP F79 (FA-VITAMIN B-6-VITAMIN B-12 PO) Take by mouth daily.    . Misc Natural Products (TART CHERRY ADVANCED PO) Take 1 capsule by mouth daily.    . Multiple Vitamin (MULTIVITAMIN) tablet Take 1 tablet by mouth daily.      . Omega-3 Fatty Acids (FISH OIL) 1000 MG CAPS Take 1,000 mg by mouth. 1 by mouth daily    . traMADol (ULTRAM) 50 MG tablet Take 1 tablet (50 mg total) by mouth daily. For chronic back pain 90 tablet 0  . Turmeric Curcumin 500 MG CAPS Take 500 mg by mouth 3 (three) times daily.      No current facility-administered medications on file prior to visit.     Past Medical History:  Diagnosis Date  . Allergy   . Arthritis   . Cataract   . Gout   . Prostate cancer ([place]) 1998    Past Surgical History:  Procedure Laterality Date  . CATARACT EXTRACTION, BILATERAL  2017  . colonoscopy with polypectomy  2014   Dr  Fuller Plan  . LUMBAR DISC SURGERY  2002   Dr Ellene Route  . PROSTATECTOMY  1998  . SHOULDER ARTHROSCOPY     right; 2011; left 1990  . TONSILLECTOMY AND ADENOIDECTOMY      Social History   Socioeconomic History  . Marital status: Married    Spouse name: Not on file  . Number of children: Not on file  . Years of education: Not on file  . Highest education level: Not on file  Occupational History  . Occupation: retired  Scientific laboratory technician  . Financial resource strain: Not hard at all  . Food insecurity    Worry: Never true    Inability: Never true  . Transportation needs    Medical: No    Non-medical: No  Tobacco Use  . Smoking status: Former Smoker    Quit date: 01/31/1985    Years since quitting: 33.5  . Smokeless tobacco: Never Used  . Tobacco comment: smoked  [place], up to 2.5 ppd  Substance and Sexual Activity  . Alcohol use: No    Comment:  none since 2012  . Drug use: No  . Sexual activity: Yes  Lifestyle  . Physical activity    Days per week: 5 days    Minutes per session: 70 min  . Stress: Not at all  Relationships  . Social connections    Talks on phone: More than three times a week    Gets together: More than three times a week    Attends religious service: More than 4 times per year    Active member of club or organization: Yes    Attends meetings of clubs or organizations: More than 4 times per year    Relationship status: Married  Other Topics Concern  . Not on file  Social History Narrative   Exercises regularly - gym 5 days a week    Family History  Problem Relation Age of Onset  . Diabetes Mother   . Dementia Mother   . Diabetes Sister   . Lung cancer Other        maternal uncle & aunt  . Colon cancer Neg Hx   . Stomach cancer Neg Hx   . Arthritis Neg Hx   . Heart disease Neg Hx   . Stroke Neg Hx   . Esophageal cancer Neg Hx   . Liver cancer Neg Hx   . Pancreatic cancer Neg Hx   .  Rectal cancer Neg Hx     Review of Systems  Constitutional:  Negative for chills and fever.  HENT: Positive for congestion, nosebleeds (No actual nosebleeds, but does see occasional blood on the Kleenex) and rhinorrhea. Negative for sinus pressure and sinus pain.   Skin: Positive for color change (Mild erythema around cut) and wound (No discharge).  Neurological: Negative for numbness (Left middle finger).       Objective:   Vitals:   08/27/18 1410  BP: 138/82  Pulse: 70  Resp: 16  Temp: 97.6 F (36.4 C)  SpO2: 99%   BP Readings from Last 3 Encounters:  08/27/18 138/82  04/10/18 116/72  04/06/18 118/70   Wt Readings from Last 3 Encounters:  08/27/18 177 lb 12.8 oz (80.6 kg)  04/10/18 180 lb (81.6 kg)  04/06/18 177 lb 6.4 oz (80.5 kg)   Body mass index is 24.11 kg/m.   Physical Exam Constitutional:      General: He is not in acute distress.    Appearance: Normal appearance. He is not ill-appearing.  HENT:     Head: Normocephalic and atraumatic.     Nose: Nose normal. No congestion or rhinorrhea.     Comments: No active bleeding, but minimal amount of blood-streaked mucus right nasal tip, nasal passageways are moist, no ulcers or lesions Skin:    General: Skin is warm and dry.     Comments: Left middle finger-1 cm laceration that has some mild surrounding erythema.  Cut is dry without discharge, nontender with palpation, no warmth, normal sensation  Neurological:     Mental Status: He is alert.     Sensory: No sensory deficit.     Motor: No weakness.            Assessment & Plan:    See Problem List for Assessment and Plan of chronic medical problems.

## 2018-08-27 ENCOUNTER — Ambulatory Visit (INDEPENDENT_AMBULATORY_CARE_PROVIDER_SITE_OTHER): Payer: Medicare Other | Admitting: Internal Medicine

## 2018-08-27 ENCOUNTER — Other Ambulatory Visit: Payer: Self-pay

## 2018-08-27 ENCOUNTER — Encounter: Payer: Self-pay | Admitting: Internal Medicine

## 2018-08-27 VITALS — BP 138/82 | HR 70 | Temp 97.6°F | Resp 16 | Ht 72.0 in | Wt 177.8 lb

## 2018-08-27 DIAGNOSIS — Z23 Encounter for immunization: Secondary | ICD-10-CM | POA: Diagnosis not present

## 2018-08-27 DIAGNOSIS — J3489 Other specified disorders of nose and nasal sinuses: Secondary | ICD-10-CM | POA: Insufficient documentation

## 2018-08-27 DIAGNOSIS — S61209A Unspecified open wound of unspecified finger without damage to nail, initial encounter: Secondary | ICD-10-CM

## 2018-08-27 DIAGNOSIS — S61219A Laceration without foreign body of unspecified finger without damage to nail, initial encounter: Secondary | ICD-10-CM | POA: Insufficient documentation

## 2018-08-27 NOTE — Assessment & Plan Note (Signed)
He has some soreness in both nasal passageways, but especially the left Occasional very minimal bleeding in mucus that is on the Kleenex Exam revealed moist nasal passageways without sores or lesions, minimal mucus left nasal tip with streak of blood Overall exam is reassuring-he just wanted to make sure there is nothing more serious going on Can continue Zyrtec Use Flonase only as needed since it dries the nasal passageways out Can try a saline mist to see if that is less irritating than what he is tried in the past

## 2018-08-27 NOTE — Assessment & Plan Note (Signed)
Mild cut left middle finger-occurred 10 days ago 1 point there was some pus, but that has resolved He denies any significant pain Cuff appears to be healing normally-mild erythema is from the normal healing process He will monitor closely and let me know if there are any changes, but no antibiotic is needed Tdap today

## 2018-08-27 NOTE — Patient Instructions (Addendum)
You received a tetanus vaccine today.   Monitor your finger - if there is expanding redness please call.    You can try a saline mist spray for your nose or saline nasal gel if needed.

## 2018-08-31 ENCOUNTER — Encounter: Payer: Self-pay | Admitting: Internal Medicine

## 2018-08-31 DIAGNOSIS — N183 Chronic kidney disease, stage 3 unspecified: Secondary | ICD-10-CM

## 2018-08-31 DIAGNOSIS — R7303 Prediabetes: Secondary | ICD-10-CM

## 2018-08-31 DIAGNOSIS — E7849 Other hyperlipidemia: Secondary | ICD-10-CM

## 2018-09-02 ENCOUNTER — Encounter: Payer: Self-pay | Admitting: Internal Medicine

## 2018-09-02 DIAGNOSIS — I499 Cardiac arrhythmia, unspecified: Secondary | ICD-10-CM

## 2018-09-06 ENCOUNTER — Other Ambulatory Visit (INDEPENDENT_AMBULATORY_CARE_PROVIDER_SITE_OTHER): Payer: Medicare Other

## 2018-09-06 DIAGNOSIS — E7849 Other hyperlipidemia: Secondary | ICD-10-CM

## 2018-09-06 DIAGNOSIS — R7303 Prediabetes: Secondary | ICD-10-CM | POA: Diagnosis not present

## 2018-09-06 DIAGNOSIS — N183 Chronic kidney disease, stage 3 unspecified: Secondary | ICD-10-CM

## 2018-09-06 DIAGNOSIS — E785 Hyperlipidemia, unspecified: Secondary | ICD-10-CM | POA: Diagnosis not present

## 2018-09-06 LAB — HIGH SENSITIVITY CRP: CRP, High Sensitivity: 2.54 mg/L (ref 0.000–5.000)

## 2018-09-12 ENCOUNTER — Telehealth: Payer: Self-pay

## 2018-09-12 ENCOUNTER — Telehealth: Payer: Self-pay | Admitting: Radiology

## 2018-09-12 NOTE — Telephone Encounter (Signed)
Attempted to reach patient to verify info and briefly go over instructions before having his monitor mailed to him

## 2018-09-12 NOTE — Telephone Encounter (Signed)
Enrolled patient for a 3 day Zio monitor to be mailed. Patient is aware monitor should arrive in 3-4 days. *48hr changed to 3 day Zio for mailing purposes during covid. Patient is aware he only needs to wear for 48hr per his dr.

## 2018-09-12 NOTE — Telephone Encounter (Signed)
Called lab they called [facility]. The test has to be sent out to [place] and they only run this specific test on sundays tuesdays and fridays. Takes 5 days after that to come back. This test is more time consuming.

## 2018-09-12 NOTE — Telephone Encounter (Signed)
-----   Message from Binnie Rail, MD sent at 09/11/2018  9:32 PM EDT ----- His insulin level has not come back -- can you call the lab and check on it.

## 2018-09-13 ENCOUNTER — Encounter: Payer: Self-pay | Admitting: Internal Medicine

## 2018-09-13 LAB — INSULIN, FREE (BIOACTIVE): Insulin, Free: 2.5 u[IU]/mL (ref 1.5–14.9)

## 2018-09-13 LAB — HOMOCYSTEINE: Homocysteine: 13.5 umol/L — ABNORMAL HIGH (ref <11.4)

## 2018-09-17 ENCOUNTER — Other Ambulatory Visit (INDEPENDENT_AMBULATORY_CARE_PROVIDER_SITE_OTHER): Payer: Medicare Other

## 2018-09-17 DIAGNOSIS — I499 Cardiac arrhythmia, unspecified: Secondary | ICD-10-CM | POA: Diagnosis not present

## 2018-09-27 DIAGNOSIS — I499 Cardiac arrhythmia, unspecified: Secondary | ICD-10-CM | POA: Diagnosis not present

## 2018-09-28 ENCOUNTER — Other Ambulatory Visit: Payer: Self-pay | Admitting: Internal Medicine

## 2018-09-29 ENCOUNTER — Encounter: Payer: Self-pay | Admitting: Internal Medicine

## 2018-09-29 ENCOUNTER — Other Ambulatory Visit: Payer: Self-pay | Admitting: Internal Medicine

## 2018-09-29 DIAGNOSIS — R9431 Abnormal electrocardiogram [ECG] [EKG]: Secondary | ICD-10-CM

## 2018-09-29 DIAGNOSIS — I493 Ventricular premature depolarization: Secondary | ICD-10-CM

## 2018-09-29 DIAGNOSIS — I499 Cardiac arrhythmia, unspecified: Secondary | ICD-10-CM

## 2018-10-01 NOTE — Telephone Encounter (Signed)
No need for new referral. I put his request in the referral

## 2018-10-04 DIAGNOSIS — Z23 Encounter for immunization: Secondary | ICD-10-CM | POA: Diagnosis not present

## 2018-10-09 ENCOUNTER — Other Ambulatory Visit (INDEPENDENT_AMBULATORY_CARE_PROVIDER_SITE_OTHER): Payer: Medicare Other

## 2018-10-09 DIAGNOSIS — N183 Chronic kidney disease, stage 3 unspecified: Secondary | ICD-10-CM

## 2018-10-09 DIAGNOSIS — R7303 Prediabetes: Secondary | ICD-10-CM

## 2018-10-09 LAB — COMPREHENSIVE METABOLIC PANEL
ALT: 23 U/L (ref 0–53)
AST: 31 U/L (ref 0–37)
Albumin: 4.7 g/dL (ref 3.5–5.2)
Alkaline Phosphatase: 43 U/L (ref 39–117)
BUN: 40 mg/dL — ABNORMAL HIGH (ref 6–23)
CO2: 29 mEq/L (ref 19–32)
Calcium: 9.9 mg/dL (ref 8.4–10.5)
Chloride: 104 mEq/L (ref 96–112)
Creatinine, Ser: 1.62 mg/dL — ABNORMAL HIGH (ref 0.40–1.50)
GFR: 41.92 mL/min — ABNORMAL LOW (ref >60.00)
Glucose, Bld: 94 mg/dL (ref 70–99)
Potassium: 4.1 mEq/L (ref 3.5–5.1)
Sodium: 141 mEq/L (ref 135–145)
Total Bilirubin: 0.5 mg/dL (ref 0.2–1.2)
Total Protein: 7.5 g/dL (ref 6.0–8.3)

## 2018-10-09 LAB — HEMOGLOBIN A1C: Hgb A1c MFr Bld: 5.9 % (ref 4.6–6.5)

## 2018-10-09 NOTE — Progress Notes (Signed)
Subjective:    Patient ID: Tom Gaines, male    DOB: 1945/12/10, 73 y.o.   MRN: [id number]  HPI The patient is here for follow up.  He is exercising regularly - walks 2 miles daily.     Prediabetes:  He is compliant with a low sugar/carbohydrate diet, except for rice and potatoes.  He does not eat any sweets.  He is exercising regularly.  Hyperlipidemia: He is taking his medication daily. He is compliant with a low fat/cholesterol diet. He denies myalgias.   CKD:  He does not take nsaids.  He drinks coffee and green tea throughout the day.  He does drink a lot of fluids, but does not drink any plain water.  He denies leg edema.  He denies changes in his urination.  Gout:  He takes allopurinol daily.  He denies recent gout symptoms.   Lower back pain, b/l shoulder pain:  He takes tylenol as needed and tramdol as needed.  He feels this is controlling his pain well.  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  He is now taking vitamin B6, folic acid and vitamin B12.  Medications and allergies reviewed with patient and updated if appropriate.  Patient Active Problem List   Diagnosis Date Noted  . Nasal sore 08/27/2018  . Bilateral shoulder pain 10/03/2017  . CKD (chronic kidney disease) 03/31/2017  . Bilateral shoulder bursitis 02/01/2017  . Acute shoulder bursitis, right 05/17/2016  . Prediabetes 03/29/2016  . Nonallopathic lesion of lumbosacral region 12/01/2015  . Nonallopathic lesion of sacral region 12/01/2015  . Nonallopathic lesion of thoracic region 12/01/2015  . Chest wall discomfort 11/20/2015  . Low back pain 03/13/2014  . Basal cell carcinoma 02/13/2013  . Hyperlipidemia 03/12/2008  . History of gout 03/12/2008  . PROSTATE CANCER, HX OF 03/12/2008  . History of colonic polyps 03/12/2008    Current Outpatient Medications on File Prior to Visit  Medication Sig Dispense Refill  . acetaminophen (TYLENOL) 500 MG  tablet Take 500 mg by mouth every 6 (six) hours as needed.    Marland Kitchen acyclovir (ZOVIRAX) 800 MG tablet Take 400 mg by mouth 2 (two) times daily.     Marland Kitchen allopurinol (ZYLOPRIM) 300 MG tablet TAKE 1 TABLET BY MOUTH  DAILY 90 tablet 1  . Alpha-Lipoic Acid 600 MG CAPS Take by mouth.    . Ascorbic Acid (VITAMIN C) 100 MG tablet Take 100 mg by mouth daily.    . Capsicum, Cayenne, (CAYENNE PO) Take by mouth.    . cetirizine (ZYRTEC) 10 MG tablet Take 10 mg by mouth as needed.     . Cholecalciferol (VITAMIN D) 2000 units CAPS Take 1 capsule by mouth daily.    . fenofibrate (TRICOR) 145 MG tablet TAKE 1 TABLET BY MOUTH  DAILY 90 tablet 0  . Folic Acid-Vit [id number] (FA-VITAMIN B-6-VITAMIN B-12 PO) Take by mouth daily.    . L-CITRULLINE PO Take by mouth.    . medium chain triglycerides (MCT OIL) oil Take by mouth 3 (three) times daily.    . Misc Natural Products (TART CHERRY ADVANCED PO) Take 1 capsule by mouth daily.    . Multiple Vitamin (MULTIVITAMIN) tablet Take 1 tablet by mouth daily.      . Omega-3 Fatty Acids (FISH OIL) 1000 MG CAPS Take 1,000 mg by mouth. 1 by mouth daily    . Probiotic Product (PROBIOTIC ADVANCED PO) Take by mouth.    . traMADol (  ULTRAM) 50 MG tablet Take 1 tablet (50 mg total) by mouth daily. For chronic back pain 90 tablet 0  . Turmeric Curcumin 500 MG CAPS Take 500 mg by mouth 3 (three) times daily.      No current facility-administered medications on file prior to visit.     Past Medical History:  Diagnosis Date  . Allergy   . Arthritis   . Cataract   . Gout   . Prostate cancer ([place]) 1998    Past Surgical History:  Procedure Laterality Date  . CATARACT EXTRACTION, BILATERAL  2017  . colonoscopy with polypectomy  2014   Dr Fuller Plan  . LUMBAR DISC SURGERY  2002   Dr Ellene Route  . PROSTATECTOMY  1998  . SHOULDER ARTHROSCOPY     right; 2011; left 1990  . TONSILLECTOMY AND ADENOIDECTOMY      Social History   Socioeconomic History  . Marital status: Married    Spouse  name: Not on file  . Number of children: Not on file  . Years of education: Not on file  . Highest education level: Not on file  Occupational History  . Occupation: retired  Scientific laboratory technician  . Financial resource strain: Not hard at all  . Food insecurity    Worry: Never true    Inability: Never true  . Transportation needs    Medical: No    Non-medical: No  Tobacco Use  . Smoking status: Former Smoker    Quit date: 01/31/1985    Years since quitting: 33.7  . Smokeless tobacco: Never Used  . Tobacco comment: smoked  [place], up to 2.5 ppd  Substance and Sexual Activity  . Alcohol use: No    Comment:  none since 2012  . Drug use: No  . Sexual activity: Yes  Lifestyle  . Physical activity    Days per week: 5 days    Minutes per session: 70 min  . Stress: Not at all  Relationships  . Social connections    Talks on phone: More than three times a week    Gets together: More than three times a week    Attends religious service: More than 4 times per year    Active member of club or organization: Yes    Attends meetings of clubs or organizations: More than 4 times per year    Relationship status: Married  Other Topics Concern  . Not on file  Social History Narrative   Exercises regularly - gym 5 days a week    Family History  Problem Relation Age of Onset  . Diabetes Mother   . Dementia Mother   . Diabetes Sister   . Lung cancer Other        maternal uncle & aunt  . Colon cancer Neg Hx   . Stomach cancer Neg Hx   . Arthritis Neg Hx   . Heart disease Neg Hx   . Stroke Neg Hx   . Esophageal cancer Neg Hx   . Liver cancer Neg Hx   . Pancreatic cancer Neg Hx   . Rectal cancer Neg Hx     Review of Systems  Constitutional: Negative for chills and fever.  Respiratory: Negative for cough, shortness of breath and wheezing.   Cardiovascular: Negative for chest pain, palpitations and leg swelling.  Genitourinary: Positive for frequency.  Neurological: Negative for  light-headedness and headaches.       Objective:   Vitals:   10/10/18 1533  BP:  120/76  Pulse: 74  Resp: 16  Temp: 98.4 F (36.9 C)  SpO2: 96%   BP Readings from Last 3 Encounters:  10/10/18 120/76  08/27/18 138/82  04/10/18 116/72   Wt Readings from Last 3 Encounters:  10/10/18 178 lb (80.7 kg)  08/27/18 177 lb 12.8 oz (80.6 kg)  04/10/18 180 lb (81.6 kg)   Body mass index is 24.14 kg/m.   Physical Exam    Constitutional: Appears well-developed and well-nourished. No distress.  HENT:  Head: Normocephalic and atraumatic.  Neck: Neck supple. No tracheal deviation present. No thyromegaly present.  No cervical lymphadenopathy Cardiovascular: Normal rate, regular rhythm and normal heart sounds.  No murmur heard. No carotid bruit .  No edema Pulmonary/Chest: Effort normal and breath sounds normal. No respiratory distress. No has no wheezes. No rales.  Skin: Skin is warm and dry. Not diaphoretic.  Psychiatric: Normal mood and affect. Behavior is normal.      Assessment & Plan:    See Problem List for Assessment and Plan of chronic medical problems.

## 2018-10-09 NOTE — Patient Instructions (Addendum)
   Medications reviewed and updated.  Changes include :   none  Your prescription(s) have been submitted to your pharmacy. Please take as directed and contact our office if you believe you are having problem(s) with the medication(s).  A referral was ordered for kidney doctor  Please followup in 6 months.  Have blood work done one week prior.

## 2018-10-10 ENCOUNTER — Encounter: Payer: Self-pay | Admitting: Internal Medicine

## 2018-10-10 ENCOUNTER — Ambulatory Visit (INDEPENDENT_AMBULATORY_CARE_PROVIDER_SITE_OTHER): Payer: Medicare Other | Admitting: Internal Medicine

## 2018-10-10 ENCOUNTER — Other Ambulatory Visit: Payer: Self-pay

## 2018-10-10 VITALS — BP 120/76 | HR 74 | Temp 98.4°F | Resp 16 | Ht 72.0 in | Wt 178.0 lb

## 2018-10-10 DIAGNOSIS — E7211 Homocystinuria: Secondary | ICD-10-CM

## 2018-10-10 DIAGNOSIS — E7849 Other hyperlipidemia: Secondary | ICD-10-CM | POA: Diagnosis not present

## 2018-10-10 DIAGNOSIS — Z8739 Personal history of other diseases of the musculoskeletal system and connective tissue: Secondary | ICD-10-CM | POA: Diagnosis not present

## 2018-10-10 DIAGNOSIS — N183 Chronic kidney disease, stage 3 unspecified: Secondary | ICD-10-CM

## 2018-10-10 DIAGNOSIS — M545 Low back pain, unspecified: Secondary | ICD-10-CM

## 2018-10-10 DIAGNOSIS — R7303 Prediabetes: Secondary | ICD-10-CM

## 2018-10-10 DIAGNOSIS — G8929 Other chronic pain: Secondary | ICD-10-CM | POA: Diagnosis not present

## 2018-10-10 DIAGNOSIS — R7983 Abnormal findings of blood amino-acid level: Secondary | ICD-10-CM

## 2018-10-10 HISTORY — DX: Homocystinuria: E72.11

## 2018-10-10 HISTORY — DX: Abnormal findings of blood amino-acid level: R79.83

## 2018-10-10 MED ORDER — TRAMADOL HCL 50 MG PO TABS: 50.0000 mg | ORAL_TABLET | Freq: Every day | ORAL | 0 refills | Status: DC

## 2018-10-10 NOTE — Assessment & Plan Note (Addendum)
Elevated homocysteine recently Taking folic acid and B6 Will recheck levels at next visit.

## 2018-10-10 NOTE — Assessment & Plan Note (Signed)
No gout symptoms Continue allopurinol 

## 2018-10-10 NOTE — Assessment & Plan Note (Signed)
A1c 5.9% Continue low sugar/carb diet-he will decrease carbs further Continue regular exercise Follow-up in 6 months

## 2018-10-10 NOTE — Assessment & Plan Note (Signed)
Decrease in GFR over the past 12 months - 18 months for unknown reason He does not take NSAIDs He does drink a lot of fluids, but only coffee and green tea.  Encouraged him to increase his water intake I would like to have his decreased kidney function evaluated further-we will refer to nephrology

## 2018-10-10 NOTE — Assessment & Plan Note (Signed)
Overall pain is controlled Continue tramadol and Tylenol as needed Continue regular walking

## 2018-10-11 ENCOUNTER — Ambulatory Visit (HOSPITAL_COMMUNITY): Payer: Medicare Other | Attending: Internal Medicine

## 2018-10-11 ENCOUNTER — Encounter: Payer: Self-pay | Admitting: Internal Medicine

## 2018-10-11 DIAGNOSIS — I493 Ventricular premature depolarization: Secondary | ICD-10-CM | POA: Diagnosis not present

## 2018-10-11 DIAGNOSIS — I5189 Other ill-defined heart diseases: Secondary | ICD-10-CM

## 2018-10-11 DIAGNOSIS — R9431 Abnormal electrocardiogram [ECG] [EKG]: Secondary | ICD-10-CM | POA: Diagnosis not present

## 2018-10-11 DIAGNOSIS — I77810 Thoracic aortic ectasia: Secondary | ICD-10-CM | POA: Insufficient documentation

## 2018-10-11 HISTORY — DX: Other ill-defined heart diseases: I51.89

## 2018-10-11 HISTORY — DX: Thoracic aortic ectasia: I77.810

## 2018-10-12 ENCOUNTER — Other Ambulatory Visit: Payer: Self-pay | Admitting: Internal Medicine

## 2018-10-22 NOTE — Progress Notes (Signed)
Referring-Stacy Quay Burow, MD Reason for referral-PVCs  HPI: 73 year old male for evaluation of PVCs at request of Billey Gosling, MD.  ETT November 2011 showed no ST changes.  Monitor August 2020 showed sinus bradycardia to sinus tachycardia, frequent PVCs and 6 beats of nonsustained ventricular tachycardia.  Echocardiogram September 2020 showed ejection fraction 55 to 123456, grade 1 diastolic dysfunction and mildly dilated ascending aorta.  Laboratories September 2020 showed potassium 4.1.  Patient is very active walking 2 miles 5 times weekly.  He has no dyspnea on exertion, orthopnea, PND, pedal edema, exertional chest pain, palpitations or history of syncope.  Because of his PVCs cardiology asked to evaluate.  Current Outpatient Medications  Medication Sig Dispense Refill  . acetaminophen (TYLENOL) 500 MG tablet Take 500 mg by mouth every 6 (six) hours as needed.    Marland Kitchen acyclovir (ZOVIRAX) 800 MG tablet Take 400 mg by mouth 2 (two) times daily.     Marland Kitchen allopurinol (ZYLOPRIM) 300 MG tablet TAKE 1 TABLET BY MOUTH  DAILY 90 tablet 1  . Alpha-Lipoic Acid 600 MG CAPS Take by mouth.    . Ascorbic Acid (VITAMIN C) 100 MG tablet Take 100 mg by mouth daily.    . Capsicum, Cayenne, (CAYENNE PO) Take by mouth.    . cetirizine (ZYRTEC) 10 MG tablet Take 10 mg by mouth as needed.     . Cholecalciferol (VITAMIN D) 2000 units CAPS Take 1 capsule by mouth daily.    . fenofibrate (TRICOR) 145 MG tablet TAKE 1 TABLET BY MOUTH  DAILY 90 tablet 0  . Folic Acid-Vit Q000111Q 123456 (FA-VITAMIN B-6-VITAMIN B-12 PO) Take by mouth daily.    . L-CITRULLINE PO Take by mouth.    . medium chain triglycerides (MCT OIL) oil Take by mouth 3 (three) times daily.    . Misc Natural Products (TART CHERRY ADVANCED PO) Take 1 capsule by mouth daily.    . Multiple Vitamin (MULTIVITAMIN) tablet Take 1 tablet by mouth daily.      . Omega-3 Fatty Acids (FISH OIL) 1000 MG CAPS Take 1,000 mg by mouth. 1 by mouth daily    . Probiotic Product  (PROBIOTIC ADVANCED PO) Take by mouth.    . traMADol (ULTRAM) 50 MG tablet Take 1 tablet (50 mg total) by mouth daily. For chronic back pain 90 tablet 0  . Turmeric Curcumin 500 MG CAPS Take 500 mg by mouth 3 (three) times daily.      No current facility-administered medications for this visit.     Allergies  Allergen Reactions  . 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 Allergy   . Arthritis   . Ascending aorta dilatation (HCC) 10/11/2018   Echo 10/11/2018 40 mm  . 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 Bilateral shoulder bursitis 02/01/2017   Bilateral injections February 01, 2017  . Cataract   . CKD (chronic kidney disease) 03/31/2017  . Diastolic dysfunction A999333   Echo 10/11/18  . History of colonic polyps 03/12/2008   Qualifier: Diagnosis of  By: Linna Darner MD, Gwyndolyn Saxon   02/06/2012  Sessile polyps (2)    . History of gout 03/12/2008   R great toe   . 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 Hyperlipidemia 03/12/2008   NMR  LipoProfile 2004: LDL 126 (total particle 1084/small dense particle #80), HDL 73.5. LDL goal equal less than 160, ideally less than 130. No FH MI or CVA  . Low back pain 03/13/2014   Chronic, has seen Dr Tamala Julian and Dr Lynann Bologna Taking tramadol as needed  . Nonallopathic lesion of lumbosacral region 12/01/2015  . Nonallopathic lesion of sacral region 12/01/2015  . Nonallopathic lesion of thoracic region 12/01/2015  . Prediabetes 03/29/2016   Mom, sister DM  . PROSTATE CANCER, HX OF 03/12/2008   Qualifier: Diagnosis of  By: Linna Darner MD, [facility]   Radical prostatectomy 1998, Urologist Dr. Lawerance Bach     Past Surgical History:  Procedure Laterality Date  . CATARACT EXTRACTION, BILATERAL  2017  . colonoscopy with polypectomy  2014   Dr Fuller Plan  . LUMBAR DISC SURGERY  2002    Dr Ellene Route  . PROSTATECTOMY  1998  . SHOULDER ARTHROSCOPY     right; 2011; left 1990  . TONSILLECTOMY AND ADENOIDECTOMY      Social History   Socioeconomic History  . Marital status: Married    Spouse name: Not on file  . Number of children: Not on file  . Years of education: Not on file  . Highest education level: Not on file  Occupational History  . Occupation: retired  Scientific laboratory technician  . Financial resource strain: Not hard at all  . Food insecurity    Worry: Never true    Inability: Never true  . Transportation needs    Medical: No    Non-medical: No  Tobacco Use  . Smoking status: Former Smoker    Quit date: 01/31/1985    Years since quitting: 33.7  . Smokeless tobacco: Never Used  . Tobacco comment: smoked  [place], up to 2.5 ppd  Substance and Sexual Activity  . Alcohol use: No    Comment:  none since 2012  . Drug use: No  . Sexual activity: Yes  Lifestyle  . Physical activity    Days per week: 5 days    Minutes per session: 70 min  . Stress: Not at all  Relationships  . Social connections    Talks on phone: More than three times a week    Gets together: More than three times a week    Attends religious service: More than 4 times per year    Active member of club or organization: Yes    Attends meetings of clubs or organizations: More than 4 times per year    Relationship status: Married  . Intimate partner violence    Fear of current or ex partner: No    Emotionally abused: No    Physically abused: No    Forced sexual activity: No  Other Topics Concern  . Not on file  Social History Narrative   Exercises regularly - gym 5 days a week    Family History  Problem Relation Age of Onset  . Diabetes Mother   . Dementia Mother   . Diabetes Sister   . Lung cancer Other        maternal uncle & aunt  . Colon cancer Neg Hx   . Stomach cancer Neg Hx   . Arthritis Neg Hx   . Heart disease Neg Hx   . Stroke Neg Hx   . Esophageal cancer Neg Hx   . Liver  cancer Neg Hx   . Pancreatic cancer Neg Hx   . Rectal cancer Neg Hx  ROS: no fevers or chills, productive cough, hemoptysis, dysphasia, odynophagia, melena, hematochezia, dysuria, hematuria, rash, seizure activity, orthopnea, PND, pedal edema, claudication. Remaining systems are negative.  Physical Exam:   Blood pressure (!) 102/56, pulse 69, temperature (!) 97 F (36.1 C), height 5\' 11"  (1.803 m), weight 177 lb (80.3 kg).  General:  Well developed/well nourished in NAD Skin warm/dry Patient not depressed No peripheral clubbing Back-normal HEENT-normal/normal eyelids Neck supple/normal carotid upstroke bilaterally; no bruits; no JVD; no thyromegaly chest - CTA/ normal expansion CV - RRR/normal S1 and S2; no murmurs, rubs or gallops;  PMI nondisplaced Abdomen -NT/ND, no HSM, no mass, + bowel sounds, positive bruit 2+ femoral pulses, no bruits Ext-no edema, chords, 2+ DP Neuro-grossly nonfocal  ECG - NSR, PVC, NSST changes; personally reviewed  A/P  1 PVCs/nonsustained ventricular tachycardia-LV function is normal.  Recent potassium also normal.  Check magnesium.  Patient is asymptomatic.  He would prefer to avoid beta-blocker if possible.  I will review with electrophysiology.  Biggest concern would be if ectopy is frequent enough to contribute to cardiomyopathy in the future.  2 bruit-schedule abdominal ultrasound to exclude aneurysm.  3 dilated aortic root-we will plan to follow this with repeat echocardiogram in the future.  4 chronic stage III kidney disease-monitored by primary care.  Kirk Ruths, MD

## 2018-10-25 ENCOUNTER — Other Ambulatory Visit: Payer: Self-pay

## 2018-10-25 ENCOUNTER — Encounter: Payer: Self-pay | Admitting: Cardiology

## 2018-10-25 ENCOUNTER — Ambulatory Visit (INDEPENDENT_AMBULATORY_CARE_PROVIDER_SITE_OTHER): Payer: Medicare Other | Admitting: Cardiology

## 2018-10-25 VITALS — BP 102/56 | HR 69 | Temp 97.0°F | Ht 71.0 in | Wt 177.0 lb

## 2018-10-25 DIAGNOSIS — Z87891 Personal history of nicotine dependence: Secondary | ICD-10-CM | POA: Diagnosis not present

## 2018-10-25 DIAGNOSIS — I493 Ventricular premature depolarization: Secondary | ICD-10-CM | POA: Diagnosis not present

## 2018-10-25 DIAGNOSIS — R0989 Other specified symptoms and signs involving the circulatory and respiratory systems: Secondary | ICD-10-CM | POA: Diagnosis not present

## 2018-10-25 LAB — MAGNESIUM: Magnesium: 2 mg/dL (ref 1.6–2.3)

## 2018-10-25 NOTE — Patient Instructions (Addendum)
Medication Instructions:  NO CHANGE If you need a refill on your cardiac medications before your next appointment, please call your pharmacy.   Lab work: Your physician recommends that you HAVE LAB WORK TODAY  If you have labs (blood work) drawn today and your tests are completely normal, you will receive your results only by: Marland Kitchen MyChart Message (if you have MyChart) OR . A paper copy in the mail If you have any lab test that is abnormal or we need to change your treatment, we will call you to review the results.  Testing/Procedures: Your physician has requested that you have an abdominal aorta duplex. During this test, an ultrasound is used to evaluate the aorta. Allow 30 minutes for this exam. Do not eat after midnight the day before and avoid carbonated beverages [facility]  Follow-Up: At [facility], you and your health needs are our priority.  As part of our continuing mission to provide you with exceptional heart care, we have created designated Provider Care Teams.  These Care Teams include your primary Cardiologist (physician) and Advanced Practice Providers (APPs -  Physician Assistants and Nurse Practitioners) who all work together to provide you with the care you need, when you need it. You will need a follow up appointment in 12 months.  Please call our office 2 months in advance to schedule this appointment.  You may see Kirk Ruths MD or one of the following Advanced Practice Providers on your designated Care Team:   Kerin Ransom, PA-C Roby Lofts, [place] . Sande Rives, PA-C

## 2018-10-29 ENCOUNTER — Other Ambulatory Visit: Payer: Self-pay

## 2018-10-29 ENCOUNTER — Ambulatory Visit (HOSPITAL_COMMUNITY)
Admission: RE | Admit: 2018-10-29 | Discharge: 2018-10-29 | Disposition: A | Discharge status: Home / Self Care | Payer: Medicare Other | Source: Ambulatory Visit | Attending: Cardiovascular Disease | Admitting: Cardiovascular Disease

## 2018-10-29 DIAGNOSIS — R0989 Other specified symptoms and signs involving the circulatory and respiratory systems: Secondary | ICD-10-CM | POA: Insufficient documentation

## 2018-10-29 DIAGNOSIS — Z87891 Personal history of nicotine dependence: Secondary | ICD-10-CM | POA: Diagnosis not present

## 2018-11-13 DIAGNOSIS — R972 Elevated prostate specific antigen [PSA]: Secondary | ICD-10-CM | POA: Diagnosis not present

## 2018-11-13 DIAGNOSIS — C61 Malignant neoplasm of prostate: Secondary | ICD-10-CM | POA: Diagnosis not present

## 2018-11-19 DIAGNOSIS — R21 Rash and other nonspecific skin eruption: Secondary | ICD-10-CM | POA: Diagnosis not present

## 2018-12-18 ENCOUNTER — Other Ambulatory Visit: Payer: Self-pay | Admitting: Internal Medicine

## 2019-01-08 DIAGNOSIS — L814 Other melanin hyperpigmentation: Secondary | ICD-10-CM | POA: Diagnosis not present

## 2019-01-08 DIAGNOSIS — L570 Actinic keratosis: Secondary | ICD-10-CM | POA: Diagnosis not present

## 2019-01-08 DIAGNOSIS — L821 Other seborrheic keratosis: Secondary | ICD-10-CM | POA: Diagnosis not present

## 2019-01-08 DIAGNOSIS — D2362 Other benign neoplasm of skin of left upper limb, including shoulder: Secondary | ICD-10-CM | POA: Diagnosis not present

## 2019-01-08 DIAGNOSIS — Z85828 Personal history of other malignant neoplasm of skin: Secondary | ICD-10-CM | POA: Diagnosis not present

## 2019-02-12 ENCOUNTER — Encounter: Payer: Self-pay | Admitting: Internal Medicine

## 2019-02-15 NOTE — Telephone Encounter (Signed)
Hi Tom Gaines,  He does not have a balance for the lab on DOS 09/06/18.  We billed for the CRP and his insurance wrote off as non covered.    We did not bill the other two labs ordered for 09/06/18 (Homocysteine and Insulin Free).  They were billed by [facility].    Thanks, Sharyn Lull

## 2019-02-18 ENCOUNTER — Encounter: Payer: Self-pay | Admitting: Internal Medicine

## 2019-02-19 DIAGNOSIS — Z8546 Personal history of malignant neoplasm of prostate: Secondary | ICD-10-CM | POA: Diagnosis not present

## 2019-02-19 DIAGNOSIS — R7303 Prediabetes: Secondary | ICD-10-CM | POA: Diagnosis not present

## 2019-02-19 DIAGNOSIS — I5189 Other ill-defined heart diseases: Secondary | ICD-10-CM | POA: Diagnosis not present

## 2019-02-19 DIAGNOSIS — E559 Vitamin D deficiency, unspecified: Secondary | ICD-10-CM | POA: Diagnosis not present

## 2019-02-19 DIAGNOSIS — N1831 Chronic kidney disease, stage 3a: Secondary | ICD-10-CM | POA: Diagnosis not present

## 2019-02-19 DIAGNOSIS — E7211 Homocystinuria: Secondary | ICD-10-CM | POA: Diagnosis not present

## 2019-02-19 DIAGNOSIS — E785 Hyperlipidemia, unspecified: Secondary | ICD-10-CM | POA: Diagnosis not present

## 2019-02-26 DIAGNOSIS — C61 Malignant neoplasm of prostate: Secondary | ICD-10-CM | POA: Diagnosis not present

## 2019-02-26 DIAGNOSIS — R972 Elevated prostate specific antigen [PSA]: Secondary | ICD-10-CM | POA: Diagnosis not present

## 2019-02-28 ENCOUNTER — Other Ambulatory Visit: Payer: Self-pay | Admitting: Nephrology

## 2019-02-28 DIAGNOSIS — N1831 Chronic kidney disease, stage 3a: Secondary | ICD-10-CM

## 2019-03-06 ENCOUNTER — Ambulatory Visit
Admission: RE | Admit: 2019-03-06 | Discharge: 2019-03-06 | Disposition: A | Discharge status: Home / Self Care | Payer: Medicare Other | Source: Ambulatory Visit | Attending: Nephrology | Admitting: Nephrology

## 2019-03-06 DIAGNOSIS — N1831 Chronic kidney disease, stage 3a: Secondary | ICD-10-CM

## 2019-03-06 DIAGNOSIS — N189 Chronic kidney disease, unspecified: Secondary | ICD-10-CM | POA: Diagnosis not present

## 2019-03-13 ENCOUNTER — Other Ambulatory Visit: Payer: Self-pay

## 2019-03-13 MED ORDER — TRAMADOL HCL 50 MG PO TABS: 50.0000 mg | ORAL_TABLET | Freq: Every day | ORAL | 0 refills | Status: DC

## 2019-03-13 NOTE — Telephone Encounter (Signed)
Last OV 10/10/18 Next OV 04/09/19 Last RF 10/27/18

## 2019-03-18 DIAGNOSIS — N529 Male erectile dysfunction, unspecified: Secondary | ICD-10-CM | POA: Diagnosis not present

## 2019-03-18 DIAGNOSIS — C61 Malignant neoplasm of prostate: Secondary | ICD-10-CM | POA: Diagnosis not present

## 2019-03-18 DIAGNOSIS — R972 Elevated prostate specific antigen [PSA]: Secondary | ICD-10-CM | POA: Diagnosis not present

## 2019-03-19 ENCOUNTER — Encounter: Payer: Self-pay | Admitting: Internal Medicine

## 2019-03-22 ENCOUNTER — Other Ambulatory Visit: Payer: Self-pay | Admitting: *Deleted

## 2019-03-22 MED ORDER — ALLOPURINOL 300 MG PO TABS: 300.0000 mg | ORAL_TABLET | Freq: Every day | ORAL | 1 refills | Status: DC

## 2019-03-25 ENCOUNTER — Other Ambulatory Visit: Payer: Self-pay

## 2019-04-02 ENCOUNTER — Other Ambulatory Visit (INDEPENDENT_AMBULATORY_CARE_PROVIDER_SITE_OTHER): Payer: Medicare Other

## 2019-04-02 DIAGNOSIS — N183 Chronic kidney disease, stage 3 unspecified: Secondary | ICD-10-CM | POA: Diagnosis not present

## 2019-04-02 DIAGNOSIS — Z8739 Personal history of other diseases of the musculoskeletal system and connective tissue: Secondary | ICD-10-CM | POA: Diagnosis not present

## 2019-04-02 DIAGNOSIS — E7849 Other hyperlipidemia: Secondary | ICD-10-CM

## 2019-04-02 DIAGNOSIS — E7211 Homocystinuria: Secondary | ICD-10-CM | POA: Diagnosis not present

## 2019-04-02 DIAGNOSIS — R7303 Prediabetes: Secondary | ICD-10-CM | POA: Diagnosis not present

## 2019-04-02 LAB — CBC WITH DIFFERENTIAL/PLATELET
Basophils Absolute: 0.1 10*3/uL (ref 0.0–0.1)
Basophils Relative: 1 % (ref 0.0–3.0)
Eosinophils Absolute: 0.1 10*3/uL (ref 0.0–0.7)
Eosinophils Relative: 2.3 % (ref 0.0–5.0)
HCT: 42.7 % (ref 39.0–52.0)
Hemoglobin: 14.1 g/dL (ref 13.0–17.0)
Lymphocytes Relative: 27.4 % (ref 12.0–46.0)
Lymphs Abs: 1.5 10*3/uL (ref 0.7–4.0)
MCHC: 33.1 g/dL (ref 30.0–36.0)
MCV: 95.8 fl (ref 78.0–100.0)
Monocytes Absolute: 0.7 10*3/uL (ref 0.1–1.0)
Monocytes Relative: 12.4 % — ABNORMAL HIGH (ref 3.0–12.0)
Neutro Abs: 3 10*3/uL (ref 1.4–7.7)
Neutrophils Relative %: 56.9 % (ref 43.0–77.0)
Platelets: 207 10*3/uL (ref 150.0–400.0)
RBC: 4.46 Mil/uL (ref 4.22–5.81)
RDW: 15.7 % — ABNORMAL HIGH (ref 11.5–15.5)
WBC: 5.4 10*3/uL (ref 4.0–10.5)

## 2019-04-02 LAB — LIPID PANEL
Cholesterol: 166 mg/dL (ref 0–200)
HDL: 57.6 mg/dL (ref >39.00)
LDL Cholesterol: 92 mg/dL (ref 0–99)
NonHDL: 108.41
Total CHOL/HDL Ratio: 3
Triglycerides: 81 mg/dL (ref 0.0–149.0)
VLDL: 16.2 mg/dL (ref 0.0–40.0)

## 2019-04-02 LAB — FOLATE: Folate: 20.3 ng/mL (ref >5.9)

## 2019-04-02 LAB — COMPREHENSIVE METABOLIC PANEL
ALT: 17 U/L (ref 0–53)
AST: 25 U/L (ref 0–37)
Albumin: 4.3 g/dL (ref 3.5–5.2)
Alkaline Phosphatase: 38 U/L — ABNORMAL LOW (ref 39–117)
BUN: 35 mg/dL — ABNORMAL HIGH (ref 6–23)
CO2: 27 mEq/L (ref 19–32)
Calcium: 10.2 mg/dL (ref 8.4–10.5)
Chloride: 106 mEq/L (ref 96–112)
Creatinine, Ser: 1.67 mg/dL — ABNORMAL HIGH (ref 0.40–1.50)
GFR: 40.42 mL/min — ABNORMAL LOW (ref >60.00)
Glucose, Bld: 91 mg/dL (ref 70–99)
Potassium: 4.8 mEq/L (ref 3.5–5.1)
Sodium: 140 mEq/L (ref 135–145)
Total Bilirubin: 0.5 mg/dL (ref 0.2–1.2)
Total Protein: 7.2 g/dL (ref 6.0–8.3)

## 2019-04-02 LAB — VITAMIN B12: Vitamin B-12: 608 pg/mL (ref 211–911)

## 2019-04-02 LAB — URIC ACID: Uric Acid, Serum: 5 mg/dL (ref 4.0–7.8)

## 2019-04-02 LAB — HEMOGLOBIN A1C: Hgb A1c MFr Bld: 5.7 % (ref 4.6–6.5)

## 2019-04-05 ENCOUNTER — Encounter: Payer: Self-pay | Admitting: Internal Medicine

## 2019-04-08 NOTE — Patient Instructions (Addendum)
  Blood work was reviewed.     Medications reviewed and updated.  Changes include :   none     Please followup in 6 months

## 2019-04-08 NOTE — Progress Notes (Signed)
Subjective:    Patient ID: Tom Gaines, male    DOB: 1945/07/16, 74 y.o.   MRN: [id number]  HPI The patient is here for follow up of their chronic medical problems, including prediabetes, hyperlipidemia, CKD, Gout, homocystinemia, lower back pain, b/l shoulder pain.  He is taking all of his medications as prescribed.    He is exercising regularly.   He walks two miles. He stretches 45 minutes.    Back pain:  It is better for the most part.  He takes two tylenol at bed.   He takes tramadol daily as needed.  He does not take it daily.  Medication does help when he takes it.  He denies side effects.  He drinks at least 20 oz of plain water.  He drinks green tea and coffee.    Medications and allergies reviewed with patient and updated if appropriate.  Patient Active Problem List   Diagnosis Date Noted  . Dupuytren's contracture of hand 04/09/2019  . Diastolic dysfunction A999333  . Ascending aorta dilatation (HCC) 10/11/2018  . 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 Nasal sore 08/27/2018  . Bilateral shoulder pain 10/03/2017  . CKD (chronic kidney disease) 03/31/2017  . Bilateral shoulder bursitis 02/01/2017  . Acute shoulder bursitis, right 05/17/2016  . Prediabetes 03/29/2016  . Nonallopathic lesion of lumbosacral region 12/01/2015  . Nonallopathic lesion of sacral region 12/01/2015  . Nonallopathic lesion of thoracic region 12/01/2015  . Chest wall discomfort 11/20/2015  . Low back pain 03/13/2014  . Basal cell carcinoma 02/13/2013  . Hyperlipidemia 03/12/2008  . History of gout 03/12/2008  . PROSTATE CANCER, HX OF 03/12/2008  . History of colonic polyps 03/12/2008    Current Outpatient Medications on File Prior to Visit  Medication Sig Dispense Refill  . acetaminophen (TYLENOL) 500 MG tablet Take 500 mg by mouth every 6 (six) hours as needed.    Marland Kitchen acyclovir (ZOVIRAX) 800 MG tablet Take 400 mg by mouth 2 (two) times daily.     Marland Kitchen allopurinol (ZYLOPRIM) 300 MG tablet  Take 1 tablet (300 mg total) by mouth daily. 90 tablet 1  . Alpha-Lipoic Acid 600 MG CAPS Take by mouth.    . Ascorbic Acid (VITAMIN C) 100 MG tablet Take 100 mg by mouth daily.    . Capsicum, Cayenne, (CAYENNE PO) Take by mouth.    . cetirizine (ZYRTEC) 10 MG tablet Take 10 mg by mouth as needed.     . Cholecalciferol (VITAMIN D) 2000 units CAPS Take 1 capsule by mouth daily.    . fenofibrate (TRICOR) 145 MG tablet TAKE 1 TABLET BY MOUTH  DAILY 90 tablet 3  . Folic Acid-Vit Q000111Q 123456 (FA-VITAMIN B-6-VITAMIN B-12 PO) Take by mouth daily.    . L-CITRULLINE PO Take by mouth.    . medium chain triglycerides (MCT OIL) oil Take by mouth 3 (three) times daily.    . Misc Natural Products (TART CHERRY ADVANCED PO) Take 1 capsule by mouth daily.    . Multiple Vitamin (MULTIVITAMIN) tablet Take 1 tablet by mouth daily.      . Omega-3 Fatty Acids (FISH OIL) 1000 MG CAPS Take 1,000 mg by mouth. 1 by mouth daily    . Probiotic Product (PROBIOTIC ADVANCED PO) Take by mouth.    . traMADol (ULTRAM) 50 MG tablet Take 1 tablet (50 mg total) by mouth daily. For chronic back pain 90 tablet 0  . Turmeric Curcumin 500 MG CAPS Take 500 mg by mouth 3 (three) times daily.  No current facility-administered medications on file prior to visit.    Past Medical History:  Diagnosis Date  . Allergy   . Arthritis   . Ascending aorta dilatation (HCC) 10/11/2018   Echo 10/11/2018 40 mm  . 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 Bilateral shoulder bursitis 02/01/2017   Bilateral injections February 01, 2017  . Cataract   . CKD (chronic kidney disease) 03/31/2017  . Diastolic dysfunction A999333   Echo 10/11/18  . History of colonic polyps 03/12/2008   Qualifier: Diagnosis of  By: Linna Darner MD, Gwyndolyn Saxon   02/06/2012  Sessile polyps (2)    . History of gout 03/12/2008   R great toe   . 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  Hyperlipidemia 03/12/2008   NMR LipoProfile 2004: LDL 126 (total particle 1084/small dense particle #80), HDL 73.5. LDL goal equal less than 160, ideally less than 130. No FH MI or CVA  . Low back pain 03/13/2014   Chronic, has seen Dr Tamala Julian and Dr Lynann Bologna Taking tramadol as needed  . Nonallopathic lesion of lumbosacral region 12/01/2015  . Nonallopathic lesion of sacral region 12/01/2015  . Nonallopathic lesion of thoracic region 12/01/2015  . Prediabetes 03/29/2016   Mom, sister DM  . PROSTATE CANCER, HX OF 03/12/2008   Qualifier: Diagnosis of  By: Linna Darner MD, [facility]   Radical prostatectomy 1998, Urologist Dr. Lawerance Bach     Past Surgical History:  Procedure Laterality Date  . CATARACT EXTRACTION, BILATERAL  2017  . colonoscopy with polypectomy  2014   Dr Fuller Plan  . LUMBAR DISC SURGERY  2002   Dr Ellene Route  . PROSTATECTOMY  1998  . SHOULDER ARTHROSCOPY     right; 2011; left 1990  . TONSILLECTOMY AND ADENOIDECTOMY      Social History   Socioeconomic History  . Marital status: Married    Spouse name: Not on file  . Number of children: Not on file  . Years of education: Not on file  . Highest education level: Not on file  Occupational History  . Occupation: retired  Tobacco Use  . Smoking status: Former Smoker    Quit date: 01/31/1985    Years since quitting: 34.2  . Smokeless tobacco: Never Used  . Tobacco comment: smoked  [place], up to 2.5 ppd  Substance and Sexual Activity  . Alcohol use: No    Comment:  none since 2012  . Drug use: No  . Sexual activity: Yes  Other Topics Concern  . Not on file  Social History Narrative   Exercises regularly - gym 5 days a week   Social Determinants of Health   Financial Resource Strain: Low Risk   . Difficulty of Paying Living Expenses: Not hard at all  Food Insecurity: No Food Insecurity  . Worried About Charity fundraiser in the Last Year: Never true  . Ran Out of Food in the Last Year: Never true  Transportation Needs: No  Transportation Needs  . Lack of Transportation (Medical): No  . Lack of Transportation (Non-Medical): No  Physical Activity: Sufficiently Active  . Days of Exercise per Week: 5 days  . Minutes of Exercise per Session: 70 min  Stress: No Stress Concern Present  . Feeling of Stress : Not at all  Social Connections: Not Isolated  . Frequency of Communication with Friends and Family: More than three  times a week  . Frequency of Social Gatherings with Friends and Family: More than three times a week  . Attends Religious Services: More than 4 times per year  . Active Member of Clubs or Organizations: Yes  . Attends Archivist Meetings: More than 4 times per year  . Marital Status: Married    Family History  Problem Relation Age of Onset  . Diabetes Mother   . Dementia Mother   . Diabetes Sister   . Lung cancer Other        maternal uncle & aunt  . Colon cancer Neg Hx   . Stomach cancer Neg Hx   . Arthritis Neg Hx   . Heart disease Neg Hx   . Stroke Neg Hx   . Esophageal cancer Neg Hx   . Liver cancer Neg Hx   . Pancreatic cancer Neg Hx   . Rectal cancer Neg Hx     Review of Systems  Constitutional: Negative for chills and fever.  Respiratory: Negative for cough, shortness of breath and wheezing.   Cardiovascular: Negative for chest pain, palpitations and leg swelling.  Neurological: Negative for dizziness, light-headedness and headaches.       Objective:   Vitals:   04/09/19 1548  BP: 124/60  Pulse: 69  Resp: 16  Temp: 97.8 F (36.6 C)  SpO2: 96%   BP Readings from Last 3 Encounters:  04/09/19 124/60  10/25/18 (!) 102/56  10/10/18 120/76   Wt Readings from Last 3 Encounters:  04/09/19 182 lb (82.6 kg)  10/25/18 177 lb (80.3 kg)  10/10/18 178 lb (80.7 kg)   Body mass index is 25.38 kg/m.   Physical Exam    Constitutional: Appears well-developed and well-nourished. No distress.  HENT:  Head: Normocephalic and atraumatic.  Neck: Neck supple. No  tracheal deviation present. No thyromegaly present.  No cervical lymphadenopathy Cardiovascular: Normal rate, regular rhythm and normal heart sounds.   No murmur heard. No carotid bruit .  No edema Pulmonary/Chest: Effort normal and breath sounds normal. No respiratory distress. No has no wheezes. No rales.  Skin: Skin is warm and dry. Not diaphoretic.  Psychiatric: Normal mood and affect. Behavior is normal.    Estimated Creatinine Clearance: 42 mL/min (A) (by C-G formula based on SCr of 1.67 mg/dL (H)).   Assessment & Plan:    See Problem List for Assessment and Plan of chronic medical problems.    This visit occurred during the SARS-CoV-2 public health emergency.  Safety protocols were in place, including screening questions prior to the visit, additional usage of staff PPE, and extensive cleaning of exam room while observing appropriate contact time as indicated for disinfecting solutions.

## 2019-04-09 ENCOUNTER — Ambulatory Visit (INDEPENDENT_AMBULATORY_CARE_PROVIDER_SITE_OTHER): Payer: Medicare Other | Admitting: Internal Medicine

## 2019-04-09 ENCOUNTER — Encounter: Payer: Self-pay | Admitting: Internal Medicine

## 2019-04-09 ENCOUNTER — Other Ambulatory Visit: Payer: Self-pay

## 2019-04-09 VITALS — BP 124/60 | HR 69 | Temp 97.8°F | Resp 16 | Ht 71.0 in | Wt 182.0 lb

## 2019-04-09 DIAGNOSIS — E7211 Homocystinuria: Secondary | ICD-10-CM | POA: Diagnosis not present

## 2019-04-09 DIAGNOSIS — Z8546 Personal history of malignant neoplasm of prostate: Secondary | ICD-10-CM

## 2019-04-09 DIAGNOSIS — G8929 Other chronic pain: Secondary | ICD-10-CM

## 2019-04-09 DIAGNOSIS — Z8739 Personal history of other diseases of the musculoskeletal system and connective tissue: Secondary | ICD-10-CM

## 2019-04-09 DIAGNOSIS — N1832 Chronic kidney disease, stage 3b: Secondary | ICD-10-CM | POA: Diagnosis not present

## 2019-04-09 DIAGNOSIS — M720 Palmar fascial fibromatosis [Dupuytren]: Secondary | ICD-10-CM | POA: Insufficient documentation

## 2019-04-09 DIAGNOSIS — R7303 Prediabetes: Secondary | ICD-10-CM

## 2019-04-09 DIAGNOSIS — M545 Low back pain: Secondary | ICD-10-CM

## 2019-04-09 DIAGNOSIS — E7849 Other hyperlipidemia: Secondary | ICD-10-CM | POA: Diagnosis not present

## 2019-04-09 NOTE — Assessment & Plan Note (Signed)
Chronic Lipids well controlled Continue fenofibrate Continue healthy diet and regular exercise

## 2019-04-09 NOTE — Assessment & Plan Note (Signed)
Chronic A1c 5.7% He is compliant with a low sugar diet and is exercising regularly

## 2019-04-09 NOTE — Assessment & Plan Note (Signed)
Following with urology-visits up-to-date

## 2019-04-09 NOTE — Assessment & Plan Note (Signed)
Uric acid level well controlled Continue allopurinol at current dose He does have some joint pain that makes him think of gout, but I wonder if this is actually osteoarthritis

## 2019-04-09 NOTE — Assessment & Plan Note (Signed)
He did establish with nephrology-Dr. Marval Regal He has increased his fluids He does not take any NSAIDs

## 2019-04-09 NOTE — Assessment & Plan Note (Signed)
Chronic low back pain Taking Tylenol daily Takes tramadol daily as needed, but does not need it on a daily basis Walking regularly, doing stretching daily Continue above Follow-up in 6 months

## 2019-04-09 NOTE — Assessment & Plan Note (Signed)
Taking folic acid, 123456

## 2019-04-10 MED ORDER — TRAMADOL HCL 50 MG PO TABS: 50.0000 mg | ORAL_TABLET | Freq: Every day | ORAL | 0 refills | Status: DC

## 2019-07-02 DIAGNOSIS — H26492 Other secondary cataract, left eye: Secondary | ICD-10-CM | POA: Diagnosis not present

## 2019-07-02 DIAGNOSIS — H04123 Dry eye syndrome of bilateral lacrimal glands: Secondary | ICD-10-CM | POA: Diagnosis not present

## 2019-07-02 DIAGNOSIS — D3131 Benign neoplasm of right choroid: Secondary | ICD-10-CM | POA: Diagnosis not present

## 2019-07-02 DIAGNOSIS — Z961 Presence of intraocular lens: Secondary | ICD-10-CM | POA: Diagnosis not present

## 2019-07-02 DIAGNOSIS — H11132 Conjunctival pigmentations, left eye: Secondary | ICD-10-CM | POA: Diagnosis not present

## 2019-07-02 DIAGNOSIS — H31091 Other chorioretinal scars, right eye: Secondary | ICD-10-CM | POA: Diagnosis not present

## 2019-07-14 ENCOUNTER — Encounter: Payer: Self-pay | Admitting: Internal Medicine

## 2019-07-14 MED ORDER — TRAMADOL HCL 50 MG PO TABS: 50.0000 mg | ORAL_TABLET | Freq: Every day | ORAL | 0 refills | Status: DC

## 2019-08-14 DIAGNOSIS — M25512 Pain in left shoulder: Secondary | ICD-10-CM | POA: Diagnosis not present

## 2019-08-14 DIAGNOSIS — M25511 Pain in right shoulder: Secondary | ICD-10-CM | POA: Diagnosis not present

## 2019-09-23 DIAGNOSIS — C61 Malignant neoplasm of prostate: Secondary | ICD-10-CM | POA: Diagnosis not present

## 2019-09-23 DIAGNOSIS — R972 Elevated prostate specific antigen [PSA]: Secondary | ICD-10-CM | POA: Diagnosis not present

## 2019-09-24 ENCOUNTER — Encounter: Payer: Self-pay | Admitting: Internal Medicine

## 2019-09-24 DIAGNOSIS — N528 Other male erectile dysfunction: Secondary | ICD-10-CM | POA: Diagnosis not present

## 2019-09-24 DIAGNOSIS — Z8546 Personal history of malignant neoplasm of prostate: Secondary | ICD-10-CM | POA: Diagnosis not present

## 2019-10-07 ENCOUNTER — Encounter: Payer: Self-pay | Admitting: Internal Medicine

## 2019-10-07 DIAGNOSIS — M549 Dorsalgia, unspecified: Secondary | ICD-10-CM

## 2019-10-09 ENCOUNTER — Other Ambulatory Visit (INDEPENDENT_AMBULATORY_CARE_PROVIDER_SITE_OTHER): Payer: Medicare Other

## 2019-10-09 ENCOUNTER — Other Ambulatory Visit: Payer: Self-pay | Admitting: Internal Medicine

## 2019-10-09 DIAGNOSIS — E7849 Other hyperlipidemia: Secondary | ICD-10-CM

## 2019-10-09 DIAGNOSIS — R7303 Prediabetes: Secondary | ICD-10-CM | POA: Diagnosis not present

## 2019-10-09 LAB — COMPREHENSIVE METABOLIC PANEL
ALT: 16 U/L (ref 0–53)
AST: 26 U/L (ref 0–37)
Albumin: 4.6 g/dL (ref 3.5–5.2)
Alkaline Phosphatase: 45 U/L (ref 39–117)
BUN: 36 mg/dL — ABNORMAL HIGH (ref 6–23)
CO2: 29 mEq/L (ref 19–32)
Calcium: 10 mg/dL (ref 8.4–10.5)
Chloride: 103 mEq/L (ref 96–112)
Creatinine, Ser: 1.38 mg/dL (ref 0.40–1.50)
GFR: 50.31 mL/min — ABNORMAL LOW (ref >60.00)
Glucose, Bld: 101 mg/dL — ABNORMAL HIGH (ref 70–99)
Potassium: 3.9 mEq/L (ref 3.5–5.1)
Sodium: 140 mEq/L (ref 135–145)
Total Bilirubin: 0.5 mg/dL (ref 0.2–1.2)
Total Protein: 7.1 g/dL (ref 6.0–8.3)

## 2019-10-09 LAB — HEMOGLOBIN A1C: Hgb A1c MFr Bld: 5.7 % (ref 4.6–6.5)

## 2019-10-09 LAB — LIPID PANEL
Cholesterol: 161 mg/dL (ref 0–200)
HDL: 58.5 mg/dL (ref >39.00)
NonHDL: 102.74
Total CHOL/HDL Ratio: 3
Triglycerides: 214 mg/dL — ABNORMAL HIGH (ref 0.0–149.0)
VLDL: 42.8 mg/dL — ABNORMAL HIGH (ref 0.0–40.0)

## 2019-10-09 LAB — LDL CHOLESTEROL, DIRECT: Direct LDL: 83 mg/dL

## 2019-10-09 NOTE — Addendum Note (Signed)
Addended by: Lerry Liner on: 10/09/2019 03:36 PM   Modules accepted: Orders

## 2019-10-10 NOTE — Patient Instructions (Signed)
  Blood work was ordered.     Medications reviewed and updated.  Changes include :     Your prescription(s) have been submitted to your pharmacy. Please take as directed and contact our office if you believe you are having problem(s) with the medication(s).  A referral was ordered for        Someone from their office will call you to schedule an appointment.    Please followup in 6 months   

## 2019-10-10 NOTE — Progress Notes (Signed)
Subjective:    Patient ID: Tom Gaines, male    DOB: 1945/11/03, 74 y.o.   MRN: [id number]  HPI The patient is here for follow up of their chronic medical problems, including prediabetes, hyperlipidemia, ckd, gout, homocystinemia, lower back pain, b/l shoulder pain  He is taking all of his medications as prescribed.     He is exercising regularly.   He walks regularly.      Medications and allergies reviewed with patient and updated if appropriate.  Patient Active Problem List   Diagnosis Date Noted  . Dupuytren's contracture of hand 04/09/2019  . Diastolic dysfunction 70/02/7492  . Ascending aorta dilatation (HCC) 10/11/2018  . 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 Nasal sore 08/27/2018  . Bilateral shoulder pain 10/03/2017  . CKD (chronic kidney disease) 03/31/2017  . Bilateral shoulder bursitis 02/01/2017  . Acute shoulder bursitis, right 05/17/2016  . Prediabetes 03/29/2016  . Nonallopathic lesion of lumbosacral region 12/01/2015  . Nonallopathic lesion of sacral region 12/01/2015  . Nonallopathic lesion of thoracic region 12/01/2015  . Low back pain 03/13/2014  . Basal cell carcinoma 02/13/2013  . Hyperlipidemia 03/12/2008  . History of gout 03/12/2008  . PROSTATE CANCER, HX OF 03/12/2008  . History of colonic polyps 03/12/2008    Current Outpatient Medications on File Prior to Visit  Medication Sig Dispense Refill  . acetaminophen (TYLENOL) 500 MG tablet Take 500 mg by mouth every 6 (six) hours as needed.    Marland Kitchen acyclovir (ZOVIRAX) 800 MG tablet Take 400 mg by mouth 2 (two) times daily.     Marland Kitchen allopurinol (ZYLOPRIM) 300 MG tablet Take 1 tablet (300 mg total) by mouth daily. 90 tablet 1  . Alpha-Lipoic Acid 600 MG CAPS Take by mouth.    . Ascorbic Acid (VITAMIN C) 100 MG tablet Take 100 mg by mouth daily.    . Capsicum, Cayenne, (CAYENNE PO) Take by mouth.    . cetirizine (ZYRTEC) 10 MG tablet Take 10 mg by mouth as needed.     . Cholecalciferol (VITAMIN D) 2000  units CAPS Take 1 capsule by mouth daily.    . fenofibrate (TRICOR) 145 MG tablet TAKE 1 TABLET BY MOUTH  DAILY 90 tablet 3  . Folic Acid-Vit W9-QPR F16 (FA-VITAMIN B-6-VITAMIN B-12 PO) Take by mouth daily.    . L-CITRULLINE PO Take by mouth.    . medium chain triglycerides (MCT OIL) oil Take by mouth 3 (three) times daily.    . Misc Natural Products (TART CHERRY ADVANCED PO) Take 1 capsule by mouth daily.    . Multiple Vitamin (MULTIVITAMIN) tablet Take 1 tablet by mouth daily.      . Omega-3 Fatty Acids (FISH OIL) 1000 MG CAPS Take 1,000 mg by mouth. 1 by mouth daily    . Probiotic Product (PROBIOTIC ADVANCED PO) Take by mouth.    . traMADol (ULTRAM) 50 MG tablet Take 1 tablet (50 mg total) by mouth daily. For chronic back pain 90 tablet 0  . Turmeric Curcumin 500 MG CAPS Take 500 mg by mouth 3 (three) times daily.      No current facility-administered medications on file prior to visit.    Past Medical History:  Diagnosis Date  . Allergy   . Arthritis   . Ascending aorta dilatation (HCC) 10/11/2018   Echo 10/11/2018 40 mm  . 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  Bilateral shoulder bursitis 02/01/2017   Bilateral injections February 01, 2017  . Cataract   . CKD (chronic kidney disease) 03/31/2017  . Diastolic dysfunction 09/02/2120   Echo 10/11/18  . History of colonic polyps 03/12/2008   Qualifier: Diagnosis of  By: Linna Darner MD, Gwyndolyn Saxon   02/06/2012  Sessile polyps (2)    . History of gout 03/12/2008   R great toe   . 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 Hyperlipidemia 03/12/2008   NMR LipoProfile 2004: LDL 126 (total particle 1084/small dense particle #80), HDL 73.5. LDL goal equal less than 160, ideally less than 130. No FH MI or CVA  . Low back pain 03/13/2014   Chronic, has seen Dr Tamala Julian and Dr Lynann Bologna Taking tramadol as needed  . Nonallopathic lesion of lumbosacral region 12/01/2015    . Nonallopathic lesion of sacral region 12/01/2015  . Nonallopathic lesion of thoracic region 12/01/2015  . Prediabetes 03/29/2016   Mom, sister DM  . PROSTATE CANCER, HX OF 03/12/2008   Qualifier: Diagnosis of  By: Linna Darner MD, [facility]   Radical prostatectomy 1998, Urologist Dr. Lawerance Bach     Past Surgical History:  Procedure Laterality Date  . CATARACT EXTRACTION, BILATERAL  2017  . colonoscopy with polypectomy  2014   Dr Fuller Plan  . LUMBAR DISC SURGERY  2002   Dr Ellene Route  . PROSTATECTOMY  1998  . SHOULDER ARTHROSCOPY     right; 2011; left 1990  . TONSILLECTOMY AND ADENOIDECTOMY      Social History   Socioeconomic History  . Marital status: Married    Spouse name: Not on file  . Number of children: Not on file  . Years of education: Not on file  . Highest education level: Not on file  Occupational History  . Occupation: retired  Tobacco Use  . Smoking status: Former Smoker    Quit date: 01/31/1985    Years since quitting: 34.7  . Smokeless tobacco: Never Used  . Tobacco comment: smoked  [place], up to 2.5 ppd  Vaping Use  . Vaping Use: Never used  Substance and Sexual Activity  . Alcohol use: No    Comment:  none since 2012  . Drug use: No  . Sexual activity: Yes  Other Topics Concern  . Not on file  Social History Narrative   Exercises regularly - gym 5 days a week   Social Determinants of Health   Financial Resource Strain:   . Difficulty of Paying Living Expenses: Not on file  Food Insecurity:   . Worried About Charity fundraiser in the Last Year: Not on file  . Ran Out of Food in the Last Year: Not on file  Transportation Needs:   . Lack of Transportation (Medical): Not on file  . Lack of Transportation (Non-Medical): Not on file  Physical Activity:   . Days of Exercise per Week: Not on file  . Minutes of Exercise per Session: Not on file  Stress:   . Feeling of Stress : Not on file  Social Connections:   . Frequency of Communication with Friends  and Family: Not on file  . Frequency of Social Gatherings with Friends and Family: Not on file  . Attends Religious Services: Not on file  . Active Member of Clubs or Organizations: Not on file  . Attends Archivist Meetings: Not on file  . Marital Status: Not on file    Family History  Problem Relation Age of Onset  . Diabetes  Mother   . Dementia Mother   . Diabetes Sister   . Lung cancer Other        maternal uncle & aunt  . Colon cancer Neg Hx   . Stomach cancer Neg Hx   . Arthritis Neg Hx   . Heart disease Neg Hx   . Stroke Neg Hx   . Esophageal cancer Neg Hx   . Liver cancer Neg Hx   . Pancreatic cancer Neg Hx   . Rectal cancer Neg Hx     Review of Systems     Objective:  There were no vitals filed for this visit. BP Readings from Last 3 Encounters:  04/09/19 124/60  10/25/18 (!) 102/56  10/10/18 120/76   Wt Readings from Last 3 Encounters:  04/09/19 182 lb (82.6 kg)  10/25/18 177 lb (80.3 kg)  10/10/18 178 lb (80.7 kg)   There is no height or weight on file to calculate BMI.   Physical Exam    Constitutional: Appears well-developed and well-nourished. No distress.  HENT:  Head: Normocephalic and atraumatic.  Neck: Neck supple. No tracheal deviation present. No thyromegaly present.  No cervical lymphadenopathy Cardiovascular: Normal rate, regular rhythm and normal heart sounds.   No murmur heard. No carotid bruit .  No edema Pulmonary/Chest: Effort normal and breath sounds normal. No respiratory distress. No has no wheezes. No rales.  Skin: Skin is warm and dry. Not diaphoretic.  Psychiatric: Normal mood and affect. Behavior is normal.      Assessment & Plan:    See Problem List for Assessment and Plan of chronic medical problems.    This visit occurred during the SARS-CoV-2 public health emergency.  Safety protocols were in place, including screening questions prior to the visit, additional usage of staff PPE, and extensive cleaning of  exam room while observing appropriate contact time as indicated for disinfecting solutions.    This encounter was created in error - please disregard.

## 2019-10-11 ENCOUNTER — Encounter: Payer: Medicare Other | Admitting: Internal Medicine

## 2019-10-13 NOTE — Patient Instructions (Addendum)
  Blood work was ordered for you to get done prior to your next appointment at the [facility] lab.     Medications reviewed and updated.  Changes include :   Start folic acid supplementation ( vitamin B9)   Your prescription(s) have been submitted to your pharmacy. Please take as directed and contact our office if you believe you are having problem(s) with the medication(s).    Please followup in 6 months

## 2019-10-13 NOTE — Progress Notes (Signed)
Subjective:    Patient ID: Tom Gaines, male    DOB: 1946/01/22, 74 y.o.   MRN: [id number]  HPI The patient is here for follow up of their chronic medical problems, including prediabetes, hyperlipidemia, ckd, gout, homocystinemia, lower back pain, b/l shoulder pain  He is taking all of his medications as prescribed.     He is exercising regularly.   He walks regularly.  Does yoga.    He is not currently taking folic acid.  Medications and allergies reviewed with patient and updated if appropriate.  Patient Active Problem List   Diagnosis Date Noted  . Dupuytren's contracture of hand 04/09/2019  . Diastolic dysfunction 88/50/2774  . Ascending aorta dilatation (HCC) 10/11/2018  . 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 Nasal sore 08/27/2018  . Bilateral shoulder pain 10/03/2017  . CKD (chronic kidney disease) 03/31/2017  . Bilateral shoulder bursitis 02/01/2017  . Acute shoulder bursitis, right 05/17/2016  . Prediabetes 03/29/2016  . Nonallopathic lesion of lumbosacral region 12/01/2015  . Nonallopathic lesion of sacral region 12/01/2015  . Nonallopathic lesion of thoracic region 12/01/2015  . Low back pain 03/13/2014  . Basal cell carcinoma 02/13/2013  . Hyperlipidemia 03/12/2008  . History of gout 03/12/2008  . PROSTATE CANCER, HX OF 03/12/2008  . History of colonic polyps 03/12/2008    Current Outpatient Medications on File Prior to Visit  Medication Sig Dispense Refill  . acetaminophen (TYLENOL) 500 MG tablet Take 500 mg by mouth every 6 (six) hours as needed.    Marland Kitchen acyclovir (ZOVIRAX) 800 MG tablet Take 400 mg by mouth 2 (two) times daily.     Marland Kitchen allopurinol (ZYLOPRIM) 300 MG tablet Take 1 tablet (300 mg total) by mouth daily. 90 tablet 1  . Alpha-Lipoic Acid 600 MG CAPS Take by mouth.    . Ascorbic Acid (VITAMIN C) 100 MG tablet Take 100 mg by mouth daily.    . Capsicum, Cayenne, (CAYENNE PO) Take by mouth.    . cetirizine (ZYRTEC) 10 MG tablet Take 10 mg by mouth as  needed.     . Cholecalciferol (VITAMIN D) 2000 units CAPS Take 1 capsule by mouth daily.    . fenofibrate (TRICOR) 145 MG tablet TAKE 1 TABLET BY MOUTH  DAILY 90 tablet 3  . L-CITRULLINE PO Take by mouth.    . medium chain triglycerides (MCT OIL) oil Take by mouth 3 (three) times daily.    . Multiple Vitamin (MULTIVITAMIN) tablet Take 1 tablet by mouth daily.      . Omega-3 Fatty Acids (FISH OIL) 1000 MG CAPS Take 1,000 mg by mouth. 1 by mouth daily    . Probiotic Product (PROBIOTIC ADVANCED PO) Take by mouth.    . traMADol (ULTRAM) 50 MG tablet Take 1 tablet (50 mg total) by mouth daily. For chronic back pain 90 tablet 0  . Turmeric Curcumin 500 MG CAPS Take 500 mg by mouth 3 (three) times daily.      No current facility-administered medications on file prior to visit.    Past Medical History:  Diagnosis Date  . Allergy   . Arthritis   . Ascending aorta dilatation (HCC) 10/11/2018   Echo 10/11/2018 40 mm  . 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 Bilateral shoulder bursitis 02/01/2017   Bilateral injections February 01, 2017  . Cataract   . CKD (chronic kidney disease) 03/31/2017  .  Diastolic dysfunction 2/54/2706   Echo 10/11/18  . History of colonic polyps 03/12/2008   Qualifier: Diagnosis of  By: Linna Darner MD, Gwyndolyn Saxon   02/06/2012  Sessile polyps (2)    . History of gout 03/12/2008   R great toe   . 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 Hyperlipidemia 03/12/2008   NMR LipoProfile 2004: LDL 126 (total particle 1084/small dense particle #80), HDL 73.5. LDL goal equal less than 160, ideally less than 130. No FH MI or CVA  . Low back pain 03/13/2014   Chronic, has seen Dr Tamala Julian and Dr Lynann Bologna Taking tramadol as needed  . Nonallopathic lesion of lumbosacral region 12/01/2015  . Nonallopathic lesion of sacral region 12/01/2015  . Nonallopathic lesion of thoracic region 12/01/2015  . Prediabetes  03/29/2016   Mom, sister DM  . PROSTATE CANCER, HX OF 03/12/2008   Qualifier: Diagnosis of  By: Linna Darner MD, [facility]   Radical prostatectomy 1998, Urologist Dr. Lawerance Bach     Past Surgical History:  Procedure Laterality Date  . CATARACT EXTRACTION, BILATERAL  2017  . colonoscopy with polypectomy  2014   Dr Fuller Plan  . LUMBAR DISC SURGERY  2002   Dr Ellene Route  . PROSTATECTOMY  1998  . SHOULDER ARTHROSCOPY     right; 2011; left 1990  . TONSILLECTOMY AND ADENOIDECTOMY      Social History   Socioeconomic History  . Marital status: Married    Spouse name: Not on file  . Number of children: Not on file  . Years of education: Not on file  . Highest education level: Not on file  Occupational History  . Occupation: retired  Tobacco Use  . Smoking status: Former Smoker    Quit date: 01/31/1985    Years since quitting: 34.7  . Smokeless tobacco: Never Used  . Tobacco comment: smoked  [place], up to 2.5 ppd  Vaping Use  . Vaping Use: Never used  Substance and Sexual Activity  . Alcohol use: No    Comment:  none since 2012  . Drug use: No  . Sexual activity: Yes  Other Topics Concern  . Not on file  Social History Narrative   Exercises regularly - gym 5 days a week   Social Determinants of Health   Financial Resource Strain:   . Difficulty of Paying Living Expenses: Not on file  Food Insecurity:   . Worried About Charity fundraiser in the Last Year: Not on file  . Ran Out of Food in the Last Year: Not on file  Transportation Needs:   . Lack of Transportation (Medical): Not on file  . Lack of Transportation (Non-Medical): Not on file  Physical Activity:   . Days of Exercise per Week: Not on file  . Minutes of Exercise per Session: Not on file  Stress:   . Feeling of Stress : Not on file  Social Connections:   . Frequency of Communication with Friends and Family: Not on file  . Frequency of Social Gatherings with Friends and Family: Not on file  . Attends Religious  Services: Not on file  . Active Member of Clubs or Organizations: Not on file  . Attends Archivist Meetings: Not on file  . Marital Status: Not on file    Family History  Problem Relation Age of Onset  . Diabetes Mother   . Dementia Mother   . Diabetes Sister   . Lung cancer Other        maternal  uncle & aunt  . Colon cancer Neg Hx   . Stomach cancer Neg Hx   . Arthritis Neg Hx   . Heart disease Neg Hx   . Stroke Neg Hx   . Esophageal cancer Neg Hx   . Liver cancer Neg Hx   . Pancreatic cancer Neg Hx   . Rectal cancer Neg Hx     Review of Systems  Constitutional: Negative for chills and fever.  Respiratory: Negative for cough, shortness of breath and wheezing.   Cardiovascular: Positive for palpitations (occ at night - monitor was normal). Negative for chest pain and leg swelling.  Neurological: Negative for light-headedness and headaches.       Objective:   Vitals:   10/15/19 1601  BP: 128/80  Pulse: 63  Temp: 98.1 F (36.7 C)  SpO2: 96%   BP Readings from Last 3 Encounters:  10/15/19 128/80  04/09/19 124/60  10/25/18 (!) 102/56   Wt Readings from Last 3 Encounters:  10/15/19 181 lb 6.4 oz (82.3 kg)  04/09/19 182 lb (82.6 kg)  10/25/18 177 lb (80.3 kg)   Body mass index is 25.3 kg/m.   Physical Exam    Constitutional: Appears well-developed and well-nourished. No distress.  HENT:  Head: Normocephalic and atraumatic.  Neck: Neck supple. No tracheal deviation present. No thyromegaly present.  No cervical lymphadenopathy Cardiovascular: Normal rate, regular rhythm and normal heart sounds.   No murmur heard. No carotid bruit .  No edema Pulmonary/Chest: Effort normal and breath sounds normal. No respiratory distress. No has no wheezes. No rales.  Skin: Skin is warm and dry. Not diaphoretic.  Psychiatric: Normal mood and affect. Behavior is normal.      Assessment & Plan:    See Problem List for Assessment and Plan of chronic medical  problems.    This visit occurred during the SARS-CoV-2 public health emergency.  Safety protocols were in place, including screening questions prior to the visit, additional usage of staff PPE, and extensive cleaning of exam room while observing appropriate contact time as indicated for disinfecting solutions.

## 2019-10-15 ENCOUNTER — Encounter: Payer: Self-pay | Admitting: Internal Medicine

## 2019-10-15 ENCOUNTER — Ambulatory Visit (INDEPENDENT_AMBULATORY_CARE_PROVIDER_SITE_OTHER): Payer: Medicare Other | Admitting: Internal Medicine

## 2019-10-15 ENCOUNTER — Other Ambulatory Visit: Payer: Self-pay

## 2019-10-15 VITALS — BP 128/80 | HR 63 | Temp 98.1°F | Wt 181.4 lb

## 2019-10-15 DIAGNOSIS — M545 Low back pain: Secondary | ICD-10-CM

## 2019-10-15 DIAGNOSIS — G8929 Other chronic pain: Secondary | ICD-10-CM

## 2019-10-15 DIAGNOSIS — N1832 Chronic kidney disease, stage 3b: Secondary | ICD-10-CM

## 2019-10-15 DIAGNOSIS — E7849 Other hyperlipidemia: Secondary | ICD-10-CM | POA: Diagnosis not present

## 2019-10-15 DIAGNOSIS — R7303 Prediabetes: Secondary | ICD-10-CM

## 2019-10-15 DIAGNOSIS — Z8739 Personal history of other diseases of the musculoskeletal system and connective tissue: Secondary | ICD-10-CM | POA: Diagnosis not present

## 2019-10-15 DIAGNOSIS — E7211 Homocystinuria: Secondary | ICD-10-CM

## 2019-10-15 MED ORDER — TRAMADOL HCL 50 MG PO TABS
50.0000 mg | ORAL_TABLET | Freq: Every day | ORAL | 0 refills | Status: DC
Start: 2019-10-15 — End: 2020-01-15

## 2019-10-15 NOTE — Assessment & Plan Note (Signed)
Chronic Low back pain Taking tylenol Tramadol prn Will start PT Doing regular walking and yoga F/u 6 months

## 2019-10-15 NOTE — Assessment & Plan Note (Signed)
Chronic Controlled Continue allopurinol

## 2019-10-15 NOTE — Assessment & Plan Note (Signed)
Lab Results  Component Value Date   HGBA1C 5.7 10/09/2019   Controlled

## 2019-10-15 NOTE — Assessment & Plan Note (Signed)
Chronic Slight improvement compare to 6 months ago

## 2019-10-15 NOTE — Assessment & Plan Note (Signed)
Chronic Not currently taking folic acid - advised to restart

## 2019-10-15 NOTE — Assessment & Plan Note (Signed)
Chronic Lipids with elevated trigs but he was not fasting - overall controlled Continue daily fibrate Regular exercise and healthy diet encouraged

## 2019-10-21 DIAGNOSIS — M545 Low back pain: Secondary | ICD-10-CM | POA: Diagnosis not present

## 2019-10-29 NOTE — Progress Notes (Signed)
HPI: FU PVCs.  ETT November 2011 showed no ST changes.  Monitor August 2020 showed sinus bradycardia to sinus tachycardia, frequent PVCs and 6 beats of nonsustained ventricular tachycardia.  Echocardiogram September 2020 showed ejection fraction 55 to 06%, grade 1 diastolic dysfunction and mildly dilated ascending aorta.  Abdominal ultrasound September 2020 showed no aneurysm.  Since last seen the patient denies any dyspnea on exertion, orthopnea, PND, pedal edema, palpitations, syncope or chest pain.    Current Outpatient Medications  Medication Sig Dispense Refill  . acetaminophen (TYLENOL) 500 MG tablet Take 500 mg by mouth every 6 (six) hours as needed.    Marland Kitchen acyclovir (ZOVIRAX) 800 MG tablet Take 400 mg by mouth 2 (two) times daily.     Marland Kitchen allopurinol (ZYLOPRIM) 300 MG tablet Take 1 tablet (300 mg total) by mouth daily. 90 tablet 1  . Alpha-Lipoic Acid 600 MG CAPS Take by mouth.    . Ascorbic Acid (VITAMIN C) 100 MG tablet Take 100 mg by mouth daily.    . Capsicum, Cayenne, (CAYENNE PO) Take by mouth.    . cetirizine (ZYRTEC) 10 MG tablet Take 10 mg by mouth as needed.     . Cholecalciferol (VITAMIN D) 2000 units CAPS Take 1 capsule by mouth daily.    . fenofibrate (TRICOR) 145 MG tablet TAKE 1 TABLET BY MOUTH  DAILY 90 tablet 3  . FOLIC ACID PO Take 3,016 mcg by mouth.    . L-CITRULLINE PO Take by mouth.    . medium chain triglycerides (MCT OIL) oil Take by mouth 3 (three) times daily.    . Multiple Vitamin (MULTIVITAMIN) tablet Take 1 tablet by mouth daily.      . Omega-3 Fatty Acids (FISH OIL) 1000 MG CAPS Take 1,000 mg by mouth. 1 by mouth daily    . Probiotic Product (PROBIOTIC ADVANCED PO) Take by mouth.    . traMADol (ULTRAM) 50 MG tablet Take 1 tablet (50 mg total) by mouth daily. For chronic back pain 90 tablet 0  . Turmeric Curcumin 500 MG CAPS Take 500 mg by mouth 3 (three) times daily.      No current facility-administered medications for this visit.     Past  Medical History:  Diagnosis Date  . Allergy   . Arthritis   . Ascending aorta dilatation (HCC) 10/11/2018   Echo 10/11/2018 40 mm  . 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 Bilateral shoulder bursitis 02/01/2017   Bilateral injections February 01, 2017  . Cataract   . CKD (chronic kidney disease) 03/31/2017  . Diastolic dysfunction 0/10/9321   Echo 10/11/18  . History of colonic polyps 03/12/2008   Qualifier: Diagnosis of  By: Linna Darner MD, Gwyndolyn Saxon   02/06/2012  Sessile polyps (2)    . History of gout 03/12/2008   R great toe   . 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 Hyperlipidemia 03/12/2008   NMR LipoProfile 2004: LDL 126 (total particle 1084/small dense particle #80), HDL 73.5. LDL goal equal less than 160, ideally less than 130. No FH MI or CVA  . Low back pain 03/13/2014   Chronic, has seen Dr Tamala Julian and Dr Lynann Bologna Taking tramadol as needed  . Nonallopathic lesion of lumbosacral region 12/01/2015  . Nonallopathic lesion of sacral region 12/01/2015  . Nonallopathic lesion of thoracic region 12/01/2015  . Prediabetes 03/29/2016   Mom, sister  DM  . PROSTATE CANCER, HX OF 03/12/2008   Qualifier: Diagnosis of  By: Linna Darner MD, [facility]   Radical prostatectomy 1998, Urologist Dr. Lawerance Bach     Past Surgical History:  Procedure Laterality Date  . CATARACT EXTRACTION, BILATERAL  2017  . colonoscopy with polypectomy  2014   Dr Fuller Plan  . LUMBAR DISC SURGERY  2002   Dr Ellene Route  . PROSTATECTOMY  1998  . SHOULDER ARTHROSCOPY     right; 2011; left 1990  . TONSILLECTOMY AND ADENOIDECTOMY      Social History   Socioeconomic History  . Marital status: Married    Spouse name: Not on file  . Number of children: Not on file  . Years of education: Not on file  . Highest education level: Not on file  Occupational History  . Occupation: retired  Tobacco Use  . Smoking status: Former Smoker    Quit date:  01/31/1985    Years since quitting: 34.7  . Smokeless tobacco: Never Used  . Tobacco comment: smoked  [place], up to 2.5 ppd  Vaping Use  . Vaping Use: Never used  Substance and Sexual Activity  . Alcohol use: No    Comment:  none since 2012  . Drug use: No  . Sexual activity: Yes  Other Topics Concern  . Not on file  Social History Narrative   Exercises regularly - gym 5 days a week   Social Determinants of Health   Financial Resource Strain:   . Difficulty of Paying Living Expenses: Not on file  Food Insecurity:   . Worried About Charity fundraiser in the Last Year: Not on file  . Ran Out of Food in the Last Year: Not on file  Transportation Needs:   . Lack of Transportation (Medical): Not on file  . Lack of Transportation (Non-Medical): Not on file  Physical Activity:   . Days of Exercise per Week: Not on file  . Minutes of Exercise per Session: Not on file  Stress:   . Feeling of Stress : Not on file  Social Connections:   . Frequency of Communication with Friends and Family: Not on file  . Frequency of Social Gatherings with Friends and Family: Not on file  . Attends Religious Services: Not on file  . Active Member of Clubs or Organizations: Not on file  . Attends Archivist Meetings: Not on file  . Marital Status: Not on file  Intimate Partner Violence:   . Fear of Current or Ex-Partner: Not on file  . Emotionally Abused: Not on file  . Physically Abused: Not on file  . Sexually Abused: Not on file    Family History  Problem Relation Age of Onset  . Diabetes Mother   . Dementia Mother   . Diabetes Sister   . Lung cancer Other        maternal uncle & aunt  . Colon cancer Neg Hx   . Stomach cancer Neg Hx   . Arthritis Neg Hx   . Heart disease Neg Hx   . Stroke Neg Hx   . Esophageal cancer Neg Hx   . Liver cancer Neg Hx   . Pancreatic cancer Neg Hx   . Rectal cancer Neg Hx     ROS: no fevers or chills, productive cough, hemoptysis,  dysphasia, odynophagia, melena, hematochezia, dysuria, hematuria, rash, seizure activity, orthopnea, PND, pedal edema, claudication. Remaining systems are negative.  Physical Exam: Well-developed well-nourished in no  acute distress.  Skin is warm and dry.  HEENT is normal.  Neck is supple.  Chest is clear to auscultation with normal expansion.  Cardiovascular exam is regular rate and rhythm.  Abdominal exam nontender or distended. No masses palpated. Extremities show no edema. neuro grossly intact  ECG-sinus rhythm at a rate of 57, first degree AV block, no ST changes.  Personally reviewed  A/P  1 PVCs/nonsustained ventricular tachycardia-as outlined above LV function is normal.  Electrolytes previously normal as well.  He remains asymptomatic.  Patient would like to avoid beta blockade.  We will plan follow-up echo in 1 year to make sure that ectopy is not contributing to cardiomyopathy.  2 dilated aortic root-we will plan follow-up echo 1 year.  3 chronic stage III kidney disease-followed by primary care.  Kirk Ruths, MD

## 2019-11-02 ENCOUNTER — Encounter: Payer: Self-pay | Admitting: Internal Medicine

## 2019-11-04 ENCOUNTER — Ambulatory Visit (INDEPENDENT_AMBULATORY_CARE_PROVIDER_SITE_OTHER): Payer: Medicare Other | Admitting: Cardiology

## 2019-11-04 ENCOUNTER — Other Ambulatory Visit: Payer: Self-pay

## 2019-11-04 ENCOUNTER — Encounter: Payer: Self-pay | Admitting: Cardiology

## 2019-11-04 VITALS — BP 124/78 | HR 57 | Ht 71.0 in | Wt 180.6 lb

## 2019-11-04 DIAGNOSIS — I493 Ventricular premature depolarization: Secondary | ICD-10-CM

## 2019-11-04 DIAGNOSIS — I77810 Thoracic aortic ectasia: Secondary | ICD-10-CM | POA: Diagnosis not present

## 2019-11-04 DIAGNOSIS — M545 Low back pain, unspecified: Secondary | ICD-10-CM | POA: Diagnosis not present

## 2019-11-04 NOTE — Patient Instructions (Signed)
  Follow-Up: At [facility], you and your health needs are our priority.  As part of our continuing mission to provide you with exceptional heart care, we have created designated Provider Care Teams.  These Care Teams include your primary Cardiologist (physician) and Advanced Practice Providers (APPs -  Physician Assistants and Nurse Practitioners) who all work together to provide you with the care you need, when you need it.  We recommend signing up for the patient portal called "MyChart".  Sign up information is provided on this After Visit Summary.  MyChart is used to connect with patients for Virtual Visits (Telemedicine).  Patients are able to view lab/test results, encounter notes, upcoming appointments, etc.  Non-urgent messages can be sent to your provider as well.   To learn more about what you can do with MyChart, go to [email or web address].    Your next appointment:   12 week(s)  The format for your next appointment:   In Person  Provider:   You may see Kirk Ruths MD or one of the following Advanced Practice Providers on your designated Care Team:    Kerin Ransom, PA-C  [place], [place]  Coletta Memos, [place]

## 2019-11-06 DIAGNOSIS — Z23 Encounter for immunization: Secondary | ICD-10-CM | POA: Diagnosis not present

## 2019-11-07 ENCOUNTER — Encounter: Payer: Self-pay | Admitting: Internal Medicine

## 2019-11-14 DIAGNOSIS — M545 Low back pain, unspecified: Secondary | ICD-10-CM | POA: Diagnosis not present

## 2019-11-28 DIAGNOSIS — M545 Low back pain, unspecified: Secondary | ICD-10-CM | POA: Diagnosis not present

## 2019-12-17 ENCOUNTER — Other Ambulatory Visit: Payer: Self-pay | Admitting: Internal Medicine

## 2019-12-19 DIAGNOSIS — M545 Low back pain, unspecified: Secondary | ICD-10-CM | POA: Diagnosis not present

## 2019-12-30 ENCOUNTER — Encounter: Payer: Self-pay | Admitting: Internal Medicine

## 2019-12-31 ENCOUNTER — Other Ambulatory Visit: Payer: Medicare Other

## 2019-12-31 DIAGNOSIS — Z20822 Contact with and (suspected) exposure to covid-19: Secondary | ICD-10-CM

## 2020-01-01 LAB — NOVEL CORONAVIRUS, NAA: SARS-CoV-2, NAA: NOT DETECTED

## 2020-01-01 LAB — SARS-COV-2, NAA 2 DAY TAT

## 2020-01-03 ENCOUNTER — Other Ambulatory Visit: Payer: Medicare Other

## 2020-01-03 DIAGNOSIS — Z20822 Contact with and (suspected) exposure to covid-19: Secondary | ICD-10-CM | POA: Diagnosis not present

## 2020-01-05 NOTE — Progress Notes (Signed)
Subjective:    Patient ID: Tom Gaines, male    DOB: Apr 05, 1945, 74 y.o.   MRN: [id number]  HPI The patient is here for an acute visit.  One week ago he started with bad cold symptoms. He was exposed to covid.  His test for covid was negative.  He was sleeping 15+ hours a day, having really bad brain fog and feeling helpless.  Last friday he tested again for covid and it came back today - negative.  He has never been this sick and tends to be sicker than friends with similar symptoms - this illness is much worse than he has ever been.  He wonders why he reacts this way.  Last Monday did not feel good.  Then through to yesterday slept 15 hrs daily.  His concern is what illness he has is zapping his energy.  He is always had low energy with colds but nothing this bad.   Typically he has less energy in the afternoon.     He is taking Mucinex, tylneol and tussin  Maybe starting to get better today.     Medications and allergies reviewed with patient and updated if appropriate.  Patient Active Problem List   Diagnosis Date Noted  . Dupuytren's contracture of hand 04/09/2019  . Diastolic dysfunction 40/37/0964  . Ascending aorta dilatation (HCC) 10/11/2018  . 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 Nasal sore 08/27/2018  . Bilateral shoulder pain 10/03/2017  . CKD (chronic kidney disease) 03/31/2017  . Bilateral shoulder bursitis 02/01/2017  . Acute shoulder bursitis, right 05/17/2016  . Prediabetes 03/29/2016  . Nonallopathic lesion of lumbosacral region 12/01/2015  . Nonallopathic lesion of sacral region 12/01/2015  . Nonallopathic lesion of thoracic region 12/01/2015  . Low back pain 03/13/2014  . Basal cell carcinoma 02/13/2013  . Hyperlipidemia 03/12/2008  . History of gout 03/12/2008  . PROSTATE CANCER, HX OF 03/12/2008  . History of colonic polyps 03/12/2008    Current Outpatient Medications on File Prior to Visit  Medication Sig Dispense Refill  . acetaminophen (TYLENOL)  500 MG tablet Take 500 mg by mouth every 6 (six) hours as needed.    Marland Kitchen acyclovir (ZOVIRAX) 800 MG tablet Take 400 mg by mouth 2 (two) times daily.     Marland Kitchen allopurinol (ZYLOPRIM) 300 MG tablet TAKE 1 TABLET EVERY DAY 90 tablet 1  . Alpha-Lipoic Acid 600 MG CAPS Take by mouth.    . Ascorbic Acid (VITAMIN C) 100 MG tablet Take 100 mg by mouth daily.    . Capsicum, Cayenne, (CAYENNE PO) Take by mouth.    . cetirizine (ZYRTEC) 10 MG tablet Take 10 mg by mouth as needed.     . Cholecalciferol (VITAMIN D) 2000 units CAPS Take 1 capsule by mouth daily.    . fenofibrate (TRICOR) 145 MG tablet TAKE 1 TABLET BY MOUTH  DAILY 90 tablet 3  . FOLIC ACID PO Take 3,838 mcg by mouth.    . L-CITRULLINE PO Take by mouth.    . medium chain triglycerides (MCT OIL) oil Take by mouth 3 (three) times daily.    . Multiple Vitamin (MULTIVITAMIN) tablet Take 1 tablet by mouth daily.      . Omega-3 Fatty Acids (FISH OIL) 1000 MG CAPS Take 1,000 mg by mouth. 1 by mouth daily    . Probiotic Product (PROBIOTIC ADVANCED PO) Take by mouth.    . traMADol (ULTRAM) 50 MG tablet Take 1 tablet (50 mg total) by mouth daily. For chronic back  pain 90 tablet 0  . Turmeric Curcumin 500 MG CAPS Take 500 mg by mouth 3 (three) times daily.      No current facility-administered medications on file prior to visit.    Past Medical History:  Diagnosis Date  . Allergy   . Arthritis   . Ascending aorta dilatation (HCC) 10/11/2018   Echo 10/11/2018 40 mm  . 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 Bilateral shoulder bursitis 02/01/2017   Bilateral injections February 01, 2017  . Cataract   . CKD (chronic kidney disease) 03/31/2017  . Diastolic dysfunction 0/26/3785   Echo 10/11/18  . History of colonic polyps 03/12/2008   Qualifier: Diagnosis of  By: Linna Darner MD, Gwyndolyn Saxon   02/06/2012  Sessile polyps (2)    . History of gout 03/12/2008   R  great toe   . 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 Hyperlipidemia 03/12/2008   NMR LipoProfile 2004: LDL 126 (total particle 1084/small dense particle #80), HDL 73.5. LDL goal equal less than 160, ideally less than 130. No FH MI or CVA  . Low back pain 03/13/2014   Chronic, has seen Dr Tamala Julian and Dr Lynann Bologna Taking tramadol as needed  . Nonallopathic lesion of lumbosacral region 12/01/2015  . Nonallopathic lesion of sacral region 12/01/2015  . Nonallopathic lesion of thoracic region 12/01/2015  . Prediabetes 03/29/2016   Mom, sister DM  . PROSTATE CANCER, HX OF 03/12/2008   Qualifier: Diagnosis of  By: Linna Darner MD, [facility]   Radical prostatectomy 1998, Urologist Dr. Lawerance Bach     Past Surgical History:  Procedure Laterality Date  . CATARACT EXTRACTION, BILATERAL  2017  . colonoscopy with polypectomy  2014   Dr Fuller Plan  . LUMBAR DISC SURGERY  2002   Dr Ellene Route  . PROSTATECTOMY  1998  . SHOULDER ARTHROSCOPY     right; 2011; left 1990  . TONSILLECTOMY AND ADENOIDECTOMY      Social History   Socioeconomic History  . Marital status: Married    Spouse name: Not on file  . Number of children: Not on file  . Years of education: Not on file  . Highest education level: Not on file  Occupational History  . Occupation: retired  Tobacco Use  . Smoking status: Former Smoker    Quit date: 01/31/1985    Years since quitting: 34.9  . Smokeless tobacco: Never Used  . Tobacco comment: smoked  [place], up to 2.5 ppd  Vaping Use  . Vaping Use: Never used  Substance and Sexual Activity  . Alcohol use: No    Comment:  none since 2012  . Drug use: No  . Sexual activity: Yes  Other Topics Concern  . Not on file  Social History Narrative   Exercises regularly - gym 5 days a week   Social Determinants of Health   Financial Resource Strain:   . Difficulty of Paying Living Expenses: Not on file  Food Insecurity:   . Worried About Charity fundraiser in the Last Year: Not on file  . Ran Out of  Food in the Last Year: Not on file  Transportation Needs:   . Lack of Transportation (Medical): Not on file  . Lack of Transportation (Non-Medical): Not on file  Physical Activity:   . Days of Exercise per Week: Not on file  . Minutes of Exercise per Session: Not  on file  Stress:   . Feeling of Stress : Not on file  Social Connections:   . Frequency of Communication with Friends and Family: Not on file  . Frequency of Social Gatherings with Friends and Family: Not on file  . Attends Religious Services: Not on file  . Active Member of Clubs or Organizations: Not on file  . Attends Archivist Meetings: Not on file  . Marital Status: Not on file    Family History  Problem Relation Age of Onset  . Diabetes Mother   . Dementia Mother   . Diabetes Sister   . Lung cancer Other        maternal uncle & aunt  . Colon cancer Neg Hx   . Stomach cancer Neg Hx   . Arthritis Neg Hx   . Heart disease Neg Hx   . Stroke Neg Hx   . Esophageal cancer Neg Hx   . Liver cancer Neg Hx   . Pancreatic cancer Neg Hx   . Rectal cancer Neg Hx     Review of Systems  Constitutional: Positive for appetite change (no appetite), fatigue and fever (101.5 max).  HENT: Positive for congestion, postnasal drip and sore throat. Negative for ear pain (ear pressure) and sinus pain.   Respiratory: Positive for cough (feels like it is sinus drainage) and shortness of breath. Negative for chest tightness and wheezing.   Gastrointestinal: Negative for abdominal pain, diarrhea and nausea.  Musculoskeletal: Positive for myalgias (achy all over).  Neurological: Positive for light-headedness and headaches (slight).       Objective:   Vitals:   01/06/20 1540  BP: 116/78  Pulse: 62  Temp: 98.5 F (36.9 C)  SpO2: 95%   BP Readings from Last 3 Encounters:  01/06/20 116/78  11/04/19 124/78  10/15/19 128/80   Wt Readings from Last 3 Encounters:  01/06/20 174 lb (78.9 kg)  11/04/19 180 lb 9.6 oz  (81.9 kg)  10/15/19 181 lb 6.4 oz (82.3 kg)   Body mass index is 24.27 kg/m.   Physical Exam    GENERAL APPEARANCE: Appears stated age, well appearing, NAD EYES: conjunctiva clear, no icterus HEENT: bilateral tympanic membranes and ear canals normal, oropharynx with mild erythema, no thyromegaly, trachea midline, no cervical or supraclavicular lymphadenopathy LUNGS: normal effort.  Good air entry.  occ find wheeze on expiration.  Crackles heard b/l.  They sound dry.   CARDIOVASCULAR: Normal S1,S2 without murmurs, no edema SKIN: Warm, dry   DG Chest 2 View CLINICAL DATA:  74 year old male with fever and cough for a few days. Reportedly negative for COVID-19. former smoker.  EXAM: CHEST - 2 VIEW  COMPARISON:  Chest radiographs 02/13/2013 and earlier.  FINDINGS: Lung volumes and mediastinal contours are stable since 2015 and within normal limits, with mildly tortuous descending thoracic aorta. Visualized tracheal air column is within normal limits. No pneumothorax, pleural effusion or confluent pulmonary opacity.  Increased symmetric and widespread pulmonary interstitial markings since 2015.  No acute osseous abnormality identified. Negative visible bowel gas pattern.  IMPRESSION: Diffusely increased pulmonary interstitial markings since 2015, nonspecific. Consider acute viral/atypical respiratory infection in the setting of fever. Otherwise this could be sequelae of smoking / chronic interstitial disease. No pleural effusion.  Electronically Signed   By: Genevie Ann M.D.   On: 01/06/2020 16:56     Assessment & Plan:    He is concerned about if he has an immune problem - I do not think this is  the case.  Will refer to allergy/immunology for further evaluation.   See Problem List for Assessment and Plan of chronic medical problems.    This visit occurred during the SARS-CoV-2 public health emergency.  Safety protocols were in place, including screening questions  prior to the visit, additional usage of staff PPE, and extensive cleaning of exam room while observing appropriate contact time as indicated for disinfecting solutions.

## 2020-01-06 ENCOUNTER — Ambulatory Visit (INDEPENDENT_AMBULATORY_CARE_PROVIDER_SITE_OTHER)
Admission: RE | Admit: 2020-01-06 | Discharge: 2020-01-06 | Disposition: A | Discharge status: Home / Self Care | Payer: Medicare Other | Source: Ambulatory Visit | Attending: Internal Medicine | Admitting: Internal Medicine

## 2020-01-06 ENCOUNTER — Other Ambulatory Visit: Payer: Self-pay | Admitting: Internal Medicine

## 2020-01-06 ENCOUNTER — Other Ambulatory Visit: Payer: Self-pay

## 2020-01-06 ENCOUNTER — Ambulatory Visit (INDEPENDENT_AMBULATORY_CARE_PROVIDER_SITE_OTHER): Payer: Medicare Other | Admitting: Internal Medicine

## 2020-01-06 ENCOUNTER — Encounter: Payer: Self-pay | Admitting: Internal Medicine

## 2020-01-06 VITALS — BP 116/78 | HR 62 | Temp 98.5°F | Ht 71.0 in | Wt 174.0 lb

## 2020-01-06 DIAGNOSIS — R059 Cough, unspecified: Secondary | ICD-10-CM | POA: Diagnosis not present

## 2020-01-06 DIAGNOSIS — D849 Immunodeficiency, unspecified: Secondary | ICD-10-CM | POA: Diagnosis not present

## 2020-01-06 DIAGNOSIS — R509 Fever, unspecified: Secondary | ICD-10-CM

## 2020-01-06 LAB — NOVEL CORONAVIRUS, NAA: SARS-CoV-2, NAA: NOT DETECTED

## 2020-01-06 MED ORDER — AZITHROMYCIN 250 MG PO TABS: ORAL_TABLET | ORAL | 0 refills | Status: DC

## 2020-01-06 NOTE — Assessment & Plan Note (Signed)
Acute Started last week covid test neg x 2 ? Flu or other viral infection Has some crackles, wheezing on exam cxr today  - show possible atypical PNA Start zpak Continue otc meds

## 2020-01-06 NOTE — Patient Instructions (Addendum)
Please have a chest xray downstairs.     A referral was ordered for an allergist/immunologist.  They will call you to schedule an appointment.

## 2020-01-07 ENCOUNTER — Encounter: Payer: Self-pay | Admitting: Internal Medicine

## 2020-01-10 ENCOUNTER — Encounter: Payer: Self-pay | Admitting: Internal Medicine

## 2020-01-15 ENCOUNTER — Encounter: Payer: Self-pay | Admitting: Internal Medicine

## 2020-01-15 MED ORDER — TRAMADOL HCL 50 MG PO TABS: 50.0000 mg | ORAL_TABLET | Freq: Every day | ORAL | 0 refills | Status: DC

## 2020-01-16 DIAGNOSIS — L723 Sebaceous cyst: Secondary | ICD-10-CM | POA: Diagnosis not present

## 2020-01-16 DIAGNOSIS — Z85828 Personal history of other malignant neoplasm of skin: Secondary | ICD-10-CM | POA: Diagnosis not present

## 2020-01-16 DIAGNOSIS — L570 Actinic keratosis: Secondary | ICD-10-CM | POA: Diagnosis not present

## 2020-01-16 DIAGNOSIS — L821 Other seborrheic keratosis: Secondary | ICD-10-CM | POA: Diagnosis not present

## 2020-01-16 DIAGNOSIS — L111 Transient acantholytic dermatosis [Grover]: Secondary | ICD-10-CM | POA: Diagnosis not present

## 2020-01-30 ENCOUNTER — Ambulatory Visit (INDEPENDENT_AMBULATORY_CARE_PROVIDER_SITE_OTHER): Payer: Medicare Other

## 2020-01-30 ENCOUNTER — Other Ambulatory Visit: Payer: Self-pay

## 2020-01-30 DIAGNOSIS — Z0000 Encounter for general adult medical examination without abnormal findings: Secondary | ICD-10-CM

## 2020-01-30 NOTE — Patient Instructions (Signed)
Tom Gaines , Thank you for taking time to come for your Medicare Wellness Visit. I appreciate your ongoing commitment to your health goals. Please review the following plan we discussed and let me know if I can assist you in the future.   Screening recommendations/referrals: Colonoscopy: 05/10/2017 Recommended yearly ophthalmology/optometry visit for glaucoma screening and checkup Recommended yearly dental visit for hygiene and checkup  Vaccinations: Influenza vaccine: 11/2019 Pneumococcal vaccine: up to date Tdap vaccine: 08/27/2018 Shingles vaccine: up to date   Covid-19: up to date  Advanced directives: Please bring a copy of your health care power of attorney and living will to the office at your convenience.  Conditions/risks identified: Yes; Reviewed health maintenance screenings with patient today and relevant education, vaccines, and/or referrals were provided. Please continue to do your personal lifestyle choices by: daily care of teeth and gums, regular physical activity (goal should be 5 days a week for 30 minutes), eat a healthy diet, avoid tobacco and drug use, limiting any alcohol intake, taking a low-dose aspirin (if not allergic or have been advised by your provider otherwise) and taking vitamins and minerals as recommended by your provider. Continue doing brain stimulating activities (puzzles, reading, adult coloring books, staying active) to keep memory sharp. Continue to eat heart healthy diet (full of fruits, vegetables, whole grains, lean protein, water--limit salt, fat, and sugar intake) and increase physical activity as tolerated.  Next appointment: Please schedule your next Medicare Wellness Visit with your Nurse Health Advisor in 1 year by calling [phone].  Preventive Care 74 Years and Older, Male Preventive care refers to lifestyle choices and visits with your health care provider that can promote health and wellness. What does preventive care include?  A yearly  physical exam. This is also called an annual well check.  Dental exams once or twice a year.  Routine eye exams. Ask your health care provider how often you should have your eyes checked.  Personal lifestyle choices, including:  Daily care of your teeth and gums.  Regular physical activity.  Eating a healthy diet.  Avoiding tobacco and drug use.  Limiting alcohol use.  Practicing safe sex.  Taking low doses of aspirin every day.  Taking vitamin and mineral supplements as recommended by your health care provider. What happens during an annual well check? The services and screenings done by your health care provider during your annual well check will depend on your age, overall health, lifestyle risk factors, and family history of disease. Counseling  Your health care provider may ask you questions about your:  Alcohol use.  Tobacco use.  Drug use.  Emotional well-being.  Home and relationship well-being.  Sexual activity.  Eating habits.  History of falls.  Memory and ability to understand (cognition).  Work and work Astronomer. Screening  You may have the following tests or measurements:  Height, weight, and BMI.  Blood pressure.  Lipid and cholesterol levels. These may be checked every 5 years, or more frequently if you are over 32 years old.  Skin check.  Lung cancer screening. You may have this screening every year starting at age 74 if you have a 30-pack-year history of smoking and currently smoke or have quit within the past 74 years.  Fecal occult blood test (FOBT) of the stool. You may have this test every year starting at age 74.  Flexible sigmoidoscopy or colonoscopy. You may have a sigmoidoscopy every 5 years or a colonoscopy every 10 years starting at age 74.  Prostate cancer  screening. Recommendations will vary depending on your family history and other risks.  Hepatitis C blood test.  Hepatitis B blood test.  Sexually transmitted  disease (STD) testing.  Diabetes screening. This is done by checking your blood sugar (glucose) after you have not eaten for a while (fasting). You may have this done every 1-3 years.  Abdominal aortic aneurysm (AAA) screening. You may need this if you are a current or former smoker.  Osteoporosis. You may be screened starting at age 74 if you are at high risk. Talk with your health care provider about your test results, treatment options, and if necessary, the need for more tests. Vaccines  Your health care provider may recommend certain vaccines, such as:  Influenza vaccine. This is recommended every year.  Tetanus, diphtheria, and acellular pertussis (Tdap, Td) vaccine. You may need a Td booster every 10 years.  Zoster vaccine. You may need this after age 74.  Pneumococcal 13-valent conjugate (PCV13) vaccine. One dose is recommended after age 74.  Pneumococcal polysaccharide (PPSV23) vaccine. One dose is recommended after age 74. Talk to your health care provider about which screenings and vaccines you need and how often you need them. This information is not intended to replace advice given to you by your health care provider. Make sure you discuss any questions you have with your health care provider. Document Released: 02/13/2015 Document Revised: 10/07/2015 Document Reviewed: 11/18/2014 Elsevier Interactive Patient Education  2017 [place] Prevention in the Home Falls can cause injuries. They can happen to people of all ages. There are many things you can do to make your home safe and to help prevent falls. What can I do on the outside of my home?  Regularly fix the edges of walkways and driveways and fix any cracks.  Remove anything that might make you trip as you walk through a door, such as a raised step or threshold.  Trim any bushes or trees on the path to your home.  Use bright outdoor lighting.  Clear any walking paths of anything that might make someone  trip, such as rocks or tools.  Regularly check to see if handrails are loose or broken. Make sure that both sides of any steps have handrails.  Any raised decks and porches should have guardrails on the edges.  Have any leaves, snow, or ice cleared regularly.  Use sand or salt on walking paths during winter.  Clean up any spills in your garage right away. This includes oil or grease spills. What can I do in the bathroom?  Use night lights.  Install grab bars by the toilet and in the tub and shower. Do not use towel bars as grab bars.  Use non-skid mats or decals in the tub or shower.  If you need to sit down in the shower, use a plastic, non-slip stool.  Keep the floor dry. Clean up any water that spills on the floor as soon as it happens.  Remove soap buildup in the tub or shower regularly.  Attach bath mats securely with double-sided non-slip rug tape.  Do not have throw rugs and other things on the floor that can make you trip. What can I do in the bedroom?  Use night lights.  Make sure that you have a light by your bed that is easy to reach.  Do not use any sheets or blankets that are too big for your bed. They should not hang down onto the floor.  Have a firm  chair that has side arms. You can use this for support while you get dressed.  Do not have throw rugs and other things on the floor that can make you trip. What can I do in the kitchen?  Clean up any spills right away.  Avoid walking on wet floors.  Keep items that you use a lot in easy-to-reach places.  If you need to reach something above you, use a strong step stool that has a grab bar.  Keep electrical cords out of the way.  Do not use floor polish or wax that makes floors slippery. If you must use wax, use non-skid floor wax.  Do not have throw rugs and other things on the floor that can make you trip. What can I do with my stairs?  Do not leave any items on the stairs.  Make sure that there  are handrails on both sides of the stairs and use them. Fix handrails that are broken or loose. Make sure that handrails are as long as the stairways.  Check any carpeting to make sure that it is firmly attached to the stairs. Fix any carpet that is loose or worn.  Avoid having throw rugs at the top or bottom of the stairs. If you do have throw rugs, attach them to the floor with carpet tape.  Make sure that you have a light switch at the top of the stairs and the bottom of the stairs. If you do not have them, ask someone to add them for you. What else can I do to help prevent falls?  Wear shoes that:  Do not have high heels.  Have rubber bottoms.  Are comfortable and fit you well.  Are closed at the toe. Do not wear sandals.  If you use a stepladder:  Make sure that it is fully opened. Do not climb a closed stepladder.  Make sure that both sides of the stepladder are locked into place.  Ask someone to hold it for you, if possible.  Clearly mark and make sure that you can see:  Any grab bars or handrails.  First and last steps.  Where the edge of each step is.  Use tools that help you move around (mobility aids) if they are needed. These include:  Canes.  Walkers.  Scooters.  Crutches.  Turn on the lights when you go into a dark area. Replace any light bulbs as soon as they burn out.  Set up your furniture so you have a clear path. Avoid moving your furniture around.  If any of your floors are uneven, fix them.  If there are any pets around you, be aware of where they are.  Review your medicines with your doctor. Some medicines can make you feel dizzy. This can increase your chance of falling. Ask your doctor what other things that you can do to help prevent falls. This information is not intended to replace advice given to you by your health care provider. Make sure you discuss any questions you have with your health care provider. Document Released:  11/13/2008 Document Revised: 06/25/2015 Document Reviewed: 02/21/2014 Elsevier Interactive Patient Education  2017 [employer].

## 2020-01-30 NOTE — Progress Notes (Addendum)
I connected with Tom Gaines today by telephone and verified that I am speaking with the correct person using two identifiers. Location patient: home Location provider: work Persons participating in the virtual visit: Tom Manor, MD.   I discussed the limitations, risks, security and privacy concerns of performing an evaluation and management service by telephone and the availability of in person appointments. I also discussed with the patient that there may be a patient responsible charge related to this service. The patient expressed understanding and verbally consented to this telephonic visit.    Interactive audio and video telecommunications were attempted between this provider and patient, however failed, due to patient having technical difficulties OR patient did not have access to video capability.  We continued and completed visit with audio only.  Some vital signs may be absent or patient reported.   Time Spent with patient on telephone encounter: 30 minutes  Subjective:   Tom Gaines is a 74 y.o. male who presents for Medicare Annual/Subsequent preventive examination.  Review of Systems    No ROS. Medicare Wellness Visit. Additional risk factors are reflected in social history. Cardiac Risk Factors include: advanced age (>58men, >39 women);dyslipidemia;male gender     Objective:    There were no vitals filed for this visit. There is no height or weight on file to calculate BMI.  Advanced Directives 01/30/2020 04/10/2018 04/26/2017 03/31/2017 07/28/2015 03/21/2014  Does Patient Have a Medical Advance Directive? Yes Yes No Yes Yes No;Yes  Type of Advance Directive - Healthcare Power of [place];Living will - Healthcare Power of [place];Living will - Healthcare Power of [place];Living will;Out of facility DNR (pink MOST or yellow form)  Does patient want to make changes to medical advance directive? No - Patient declined - - - - -  Copy of Healthcare Power of Attorney in  Chart? - No - copy requested - No - copy requested No - copy requested No - copy requested    Current Medications (verified) Outpatient Encounter Medications as of 01/30/2020  Medication Sig   acetaminophen (TYLENOL) 500 MG tablet Take 500 mg by mouth every 6 (six) hours as needed.   acyclovir (ZOVIRAX) 800 MG tablet Take 400 mg by mouth 2 (two) times daily.    allopurinol (ZYLOPRIM) 300 MG tablet TAKE 1 TABLET EVERY DAY   Alpha-Lipoic Acid 600 MG CAPS Take by mouth.   Ascorbic Acid (VITAMIN C) 100 MG tablet Take 100 mg by mouth daily.   azithromycin (ZITHROMAX) 250 MG tablet Take two tabs the first day and then one tab daily for four days   Capsicum, Cayenne, (CAYENNE PO) Take by mouth.   cetirizine (ZYRTEC) 10 MG tablet Take 10 mg by mouth as needed.    Cholecalciferol (VITAMIN D) 2000 units CAPS Take 1 capsule by mouth daily.   fenofibrate (TRICOR) 145 MG tablet TAKE 1 TABLET BY MOUTH  DAILY   FOLIC ACID PO Take 1,000 mcg by mouth.   L-CITRULLINE PO Take by mouth.   medium chain triglycerides (MCT OIL) oil Take by mouth 3 (three) times daily.   Multiple Vitamin (MULTIVITAMIN) tablet Take 1 tablet by mouth daily.     Omega-3 Fatty Acids (FISH OIL) 1000 MG CAPS Take 1,000 mg by mouth. 1 by mouth daily   Probiotic Product (PROBIOTIC ADVANCED PO) Take by mouth.   traMADol (ULTRAM) 50 MG tablet Take 1 tablet (50 mg total) by mouth daily. For chronic back pain   Turmeric Curcumin 500 MG CAPS Take 500 mg by mouth 3 (  three) times daily.    No facility-administered encounter medications on file as of 01/30/2020.    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 LDL goal equal less than 160, ideally less than 130. No FH MI or CVA   Low back pain 03/13/2014   Chronic, has seen Dr Tamala Julian and Dr Lynann Bologna Taking tramadol as needed   Nonallopathic lesion of lumbosacral region 12/01/2015   Nonallopathic lesion of sacral region 12/01/2015   Nonallopathic lesion of thoracic region 12/01/2015   Prediabetes 03/29/2016   Mom, sister DM   PROSTATE CANCER, HX OF 03/12/2008   Qualifier: Diagnosis of  By: Linna Darner MD, [facility]   Radical prostatectomy 1998, Urologist Dr. Lawerance Bach    Past Surgical History:  Procedure Laterality Date   CATARACT EXTRACTION, BILATERAL  2017   colonoscopy with polypectomy  2014   Dr Sydnee Cabal [facility] SURGERY  2002   Dr Ellene Route   PROSTATECTOMY  1998   SHOULDER ARTHROSCOPY     right; 2011; left 1990   TONSILLECTOMY AND ADENOIDECTOMY     Family History  Problem Relation Age of Onset   Diabetes Mother    Dementia Mother    Diabetes Sister    Lung cancer Other        maternal uncle & aunt   Colon cancer Neg Hx    Stomach cancer Neg Hx    Arthritis Neg Hx    Heart disease Neg Hx    Stroke Neg Hx    Esophageal cancer Neg Hx    Liver cancer Neg Hx    Pancreatic cancer Neg Hx    Rectal cancer Neg Hx    Social History   Socioeconomic History   Marital status: Married    Spouse name: Not on file   Number of children: Not on file   Years of education: Not on file   Highest education level: Not on file  Occupational History   Occupation: retired  Tobacco Use   Smoking status: Former  Smoker    Quit date: 01/31/1985    Years since quitting: 35.0   Smokeless tobacco: Never Used   Tobacco comment: smoked  [place], up to 2.5 ppd  Vaping Use   Vaping Use: Never used  Substance and Sexual Activity   Alcohol use: No    Comment:  none since 2012   Drug use: No   Sexual activity: Yes  Other Topics Concern   Not on file  Social History Narrative   Exercises regularly - gym 5 days a week   Social Determinants of Health   Financial Resource Strain:  Low Risk    Difficulty of Paying Living Expenses: Not hard at all  Food Insecurity: No Food Insecurity   Worried About Running Out of Food in the Last Year: Never true   Ran Out of Food in the Last Year: Never true  Transportation Needs: No Transportation Needs   Lack of Transportation (Medical): No   Lack of Transportation (Non-Medical): No  Physical Activity: Sufficiently Active   Days of Exercise per Week: 5 days   Minutes of Exercise per Session: 30 min  Stress: No Stress Concern Present   Feeling of Stress : Not at all  Social Connections: Socially Integrated   Frequency of Communication with Friends and Family: More than three times a week   Frequency of Social Gatherings with Friends and Family: More than three times a week   Attends Religious Services: More than 4 times per year   Active Member of [employer] or Organizations: Yes   Attends Music therapist: More than 4 times per year   Marital Status: Married    Tobacco Counseling Counseling given: Not Answered Comment: smoked  [place], up to 2.5 ppd   Clinical Intake:  Pre-visit preparation completed: Yes  Pain : No/denies pain     Nutritional Risks: None Diabetes: No  How often do you need to have someone help you when you read instructions, pamphlets, or other written materials from your doctor or pharmacy?: 1 - Never What is the last grade level you completed in school?: Post Graduate Degree  Diabetic? no  Interpreter  Needed?: No  Information entered by :: Lisette Abu, LPN   Activities of Daily Living In your present state of health, do you have any difficulty performing the following activities: 01/30/2020 01/06/2020  Hearing? N N  Vision? N N  Difficulty concentrating or making decisions? N N  Walking or climbing stairs? N N  Dressing or bathing? N N  Doing errands, shopping? N N  Preparing Food and eating ? N -  Using the Toilet? N -  In the past six months, have you accidently leaked urine? N -  Do you have problems with loss of bowel control? N -  Managing your Medications? N -  Managing your Finances? N -  Some recent data might be hidden    Patient Care Team: Binnie Rail, MD as PCP - General (Internal Medicine) Stanford Breed Denice Bors, MD as PCP - Cardiology (Cardiology)  Indicate any recent Medical Services you may have received from other than [facility] providers in the past year (date may be approximate).     Assessment:   This is a routine wellness examination for [place].  Hearing/Vision screen No exam data present  Dietary issues and exercise activities discussed: Current Exercise Habits: Home exercise routine;Structured exercise class, Type of exercise: walking;treadmill;stretching;strength training/weights, Time (Minutes): 30, Frequency (Times/Week): 5, Weekly Exercise (Minutes/Week): 150, Intensity: Moderate, Exercise limited by: orthopedic condition(s)  Goals      Patient Stated     Continue to enjoy life and family     Patient Stated     Maintain current health status by continuing eat healthy and stay physically and socially active.       Depression Screen PHQ 2/9 Scores 01/30/2020 01/06/2020 04/10/2018 03/31/2017 07/08/2016 03/30/2015 02/17/2014  PHQ - 2 Score 0 0 0 0 0 0 0  PHQ- 9 Score - - - 0 - - -    Fall Risk Fall Risk  01/30/2020 01/06/2020 04/10/2018 03/31/2017 07/08/2016  Falls  in the past year? 0 0 0 No No  Number falls in past yr: 0 0 - - -  Injury with Fall? 0  0 - - -  Risk for fall due to : No Fall Risks No Fall Risks - - -  Follow up Falls evaluation completed Falls evaluation completed - - -    FALL RISK PREVENTION PERTAINING TO THE HOME:  Any stairs in or around the home? Yes  If so, are there any without handrails? No  Home free of loose throw rugs in walkways, pet beds, electrical cords, etc? Yes  Adequate lighting in your home to reduce risk of falls? Yes   ASSISTIVE DEVICES UTILIZED TO PREVENT FALLS:  Life alert? No  Use of a cane, walker or w/c? No  Grab bars in the bathroom? Yes  Shower chair or bench in shower? Yes  Elevated toilet seat or a handicapped toilet? Yes   TIMED UP AND GO:  Was the test performed? No .  Length of time to ambulate 10 feet: 0 sec.   Gait steady and fast without use of assistive device  Cognitive Function: No flowsheet data found.        Immunizations Immunization History  Administered Date(s) Administered   Fluad Quad(high Dose 65+) 10/04/2018   Influenza Whole [birth year], 10/08/2008, 11/08/2011   Influenza, High Dose Seasonal PF 10/22/2012, 11/20/2015, 10/03/2017   Influenza,inj,Quad PF,6+ Mos 10/10/2014   Influenza-Unspecified 11/14/2016   PFIZER SARS-COV-2 Vaccination 02/22/2019, 03/15/2019, 11/06/2019   Pneumococcal Conjugate-13 10/10/2014   Pneumococcal Polysaccharide-23 02/13/2013   Td 03/12/2008   Tdap 08/27/2018   Zoster 05/30/2006   Zoster Recombinat (Shingrix) 05/27/2016    TDAP status: Up to date  Flu Vaccine status: Up to date  Pneumococcal vaccine status: Up to date  Covid-19 vaccine status: Completed vaccines  Qualifies for Shingles Vaccine? Yes   Zostavax completed Yes   Shingrix Completed?: Yes  Screening Tests Health Maintenance  Topic Date Due   INFLUENZA VACCINE  09/01/2019   COVID-19 Vaccine (4 - Booster for [place] series) 05/06/2020   COLONOSCOPY (Pts 45-74yrs Insurance coverage will need to be confirmed)  04/27/2022   TETANUS/TDAP  08/26/2028    Hepatitis C Screening  Completed   PNA vac Low Risk Adult  Completed    Health Maintenance  Health Maintenance Due  Topic Date Due   INFLUENZA VACCINE  09/01/2019    Colorectal cancer screening: Type of screening: CT Colonography. Completed 05/10/2017. Repeat every 5 years  Lung Cancer Screening: (Low Dose CT Chest recommended if Age 76-80 years, 30 pack-year currently smoking OR have quit w/in 15years.) does not qualify.   Lung Cancer Screening Referral: no  Additional Screening:  Hepatitis C Screening: does qualify; Completed yes  Vision Screening: Recommended annual ophthalmology exams for early detection of glaucoma and other disorders of the eye. Is the patient up to date with their annual eye exam?  Yes  Who is the provider or what is the name of the office in which the patient attends annual eye exams? Clent Jacks, MD. If pt is not established with a provider, would they like to be referred to a provider to establish care? No .   Dental Screening: Recommended annual dental exams for proper oral hygiene  Community Resource Referral / Chronic Care Management: CRR required this visit?  No   CCM required this visit?  No      Plan:     I have personally reviewed and noted the following in the patient's  chart:   Medical and social history Use of alcohol, tobacco or illicit drugs  Current medications and supplements Functional ability and status Nutritional status Physical activity Advanced directives List of other physicians Hospitalizations, surgeries, and ER visits in previous 12 months Vitals Screenings to include cognitive, depression, and falls Referrals and appointments  In addition, I have reviewed and discussed with patient certain preventive protocols, quality metrics, and best practice recommendations. A written personalized care plan for preventive services as well as general preventive health recommendations were provided to patient.     Sheral Flow, LPN   [id number]   Nurse Notes:  Patient is cogitatively intact. There were no vitals filed for this visit. There is no height or weight on file to calculate BMI.  Medical screening examination/treatment/procedure(s) were performed by non-physician practitioner and as supervising physician I was immediately available for consultation/collaboration.  I agree with above. Lew Dawes, MD

## 2020-02-06 DIAGNOSIS — M545 Low back pain, unspecified: Secondary | ICD-10-CM | POA: Diagnosis not present

## 2020-02-24 ENCOUNTER — Ambulatory Visit: Payer: Medicare Other | Admitting: Allergy

## 2020-02-27 DIAGNOSIS — M545 Low back pain, unspecified: Secondary | ICD-10-CM | POA: Diagnosis not present

## 2020-03-02 ENCOUNTER — Other Ambulatory Visit: Payer: Self-pay

## 2020-03-02 ENCOUNTER — Encounter: Payer: Self-pay | Admitting: Allergy

## 2020-03-02 ENCOUNTER — Ambulatory Visit (INDEPENDENT_AMBULATORY_CARE_PROVIDER_SITE_OTHER): Payer: Medicare Other | Admitting: Allergy

## 2020-03-02 VITALS — BP 128/78 | HR 89 | Temp 98.7°F | Resp 18 | Ht 71.0 in | Wt 181.0 lb

## 2020-03-02 DIAGNOSIS — Z880 Allergy status to penicillin: Secondary | ICD-10-CM

## 2020-03-02 DIAGNOSIS — Z130 Encounter for screening for diseases of the blood and blood-forming organs and certain disorders involving the immune mechanism: Secondary | ICD-10-CM

## 2020-03-02 DIAGNOSIS — J3089 Other allergic rhinitis: Secondary | ICD-10-CM

## 2020-03-02 DIAGNOSIS — Z8709 Personal history of other diseases of the respiratory system: Secondary | ICD-10-CM | POA: Diagnosis not present

## 2020-03-02 MED ORDER — AZELASTINE HCL 0.1 % NA SOLN
1.0000 | Freq: Two times a day (BID) | NASAL | 5 refills | Status: DC | PRN
Start: 2020-03-02 — End: 2020-10-16

## 2020-03-02 NOTE — Patient Instructions (Addendum)
Today's skin testing showed: Positive to mold, dog, and dust mites.  Environmental allergies  Start environmental control measures as below.  May use over the counter antihistamines such as Zyrtec (cetirizine), Claritin (loratadine), Allegra (fexofenadine), or Xyzal (levocetirizine) daily as needed.  May use azelastine nasal spray 1-2 sprays per nostril twice a day as needed for runny nose/drainage.  Infections:  Keep track of infections. Get bloodwork:  We are ordering labs, so please allow 1-2 weeks for the results to come back. With the newly implemented Cures Act, the labs might be visible to you at the same time that they become visible to me. However, I will not address the results until all of the results are back, so please be patient.  In the meantime, continue recommendations in your patient instructions, including avoidance measures (if applicable), until you hear from me.  Penicillin allergy:  Consider penicillin skin testing/drug challenge in the future.  If interested we can schedule drug challenge to penicillin. You must be off antihistamines for 3-5 days before. Must be in good health and not ill. Plan on being in the office for 2-3 hours and must bring in the drug you want to do the oral challenge for - will send in prescription to pick up a few days before. You must call to schedule an appointment and specify it's for a drug challenge.   More than 90% of patients outgrow their penicillin allergy.   Follow up for penicillin skin testing and drug challenge.   Mold Control . Mold and fungi can grow on a variety of surfaces provided certain temperature and moisture conditions exist.  . Outdoor molds grow on plants, decaying vegetation and soil. The major outdoor mold, Alternaria and Cladosporium, are found in very high numbers during hot and dry conditions. Generally, a late summer - fall peak is seen for common outdoor fungal spores. Rain will temporarily lower outdoor  mold spore count, but counts rise rapidly when the rainy period ends. . The most important indoor molds are Aspergillus and Penicillium. Dark, humid and poorly ventilated basements are ideal sites for mold growth. The next most common sites of mold growth are the bathroom and the kitchen. Outdoor (Seasonal) Mold Control . Use air conditioning and keep windows closed. . Avoid exposure to decaying vegetation. Marland Kitchen Avoid leaf raking. . Avoid grain handling. . Consider wearing a face mask if working in moldy areas.  Indoor (Perennial) Mold Control  . Maintain humidity below 50%. . Get rid of mold growth on hard surfaces with water, detergent and, if necessary, 5% bleach (do not mix with other cleaners). Then dry the area completely. If mold covers an area more than 10 square feet, consider hiring an indoor environmental professional. . For clothing, washing with soap and water is best. If moldy items cannot be cleaned and dried, throw them away. . Remove sources e.g. contaminated carpets. . Repair and seal leaking roofs or pipes. Using dehumidifiers in damp basements may be helpful, but empty the water and clean units regularly to prevent mildew from forming. All rooms, especially basements, bathrooms and kitchens, require ventilation and cleaning to deter mold and mildew growth. Avoid carpeting on concrete or damp floors, and storing items in damp areas. Pet Allergen Avoidance: . Contrary to popular opinion, there are no "hypoallergenic" breeds of dogs or cats. That is because people are not allergic to an animal's hair, but to an allergen found in the animal's saliva, dander (dead skin flakes) or urine. Pet allergy symptoms typically  occur within minutes. For some people, symptoms can build up and become most severe 8 to 12 hours after contact with the animal. People with severe allergies can experience reactions in public places if dander has been transported on the pet owners' clothing. Marland Kitchen Keeping an  animal outdoors is only a partial solution, since homes with pets in the yard still have higher concentrations of animal allergens. . Before getting a pet, ask your allergist to determine if you are allergic to animals. If your pet is already considered part of your family, try to minimize contact and keep the pet out of the bedroom and other rooms where you spend a great deal of time. . As with dust mites, vacuum carpets often or replace carpet with a hardwood floor, tile or linoleum. . High-efficiency particulate air (HEPA) cleaners can reduce allergen levels over time. . While dander and saliva are the source of cat and dog allergens, urine is the source of allergens from rabbits, hamsters, mice and [place] pigs; so ask a non-allergic family member to clean the animal's cage. . If you have a pet allergy, talk to your allergist about the potential for allergy immunotherapy (allergy shots). This strategy can often provide long-term relief. Control of House Dust Mite Allergen . Dust mite allergens are a common trigger of allergy and asthma symptoms. While they can be found throughout the house, these microscopic creatures thrive in warm, humid environments such as bedding, upholstered furniture and carpeting. . Because so much time is spent in the bedroom, it is essential to reduce mite levels there.  . Encase pillows, mattresses, and box springs in special allergen-proof fabric covers or airtight, zippered plastic covers.  . Bedding should be washed weekly in hot water (130 F) and dried in a hot dryer. Allergen-proof covers are available for comforters and pillows that can't be regularly washed.  Wendee Copp the allergy-proof covers every few months. Minimize clutter in the bedroom. Keep pets out of the bedroom.  Marland Kitchen Keep humidity less than 50% by using a dehumidifier or air conditioning. You can buy a humidity measuring device called a hygrometer to monitor this.  . If possible, replace carpets with  hardwood, linoleum, or washable area rugs. If that's not possible, vacuum frequently with a vacuum that has a HEPA filter. . Remove all upholstered furniture and non-washable window drapes from the bedroom. . Remove all non-washable stuffed toys from the bedroom.  Wash stuffed toys weekly.

## 2020-03-02 NOTE — Assessment & Plan Note (Signed)
Patient was given penicillin for some kind of skin infection which led to worsening rash/hives.  Not sure if patient had penicillin prior to this event.  Reaction was treated as outpatient.  Consider penicillin skin testing/drug challenge in the future.  If interested we can schedule drug challenge to penicillin. You must be off antihistamines for 3-5 days before. Must be in good health and not ill. Plan on being in the office for 2-3 hours and must bring in the drug you want to do the oral challenge for - will send in prescription to pick up a few days before. You must call to schedule an appointment and specify it's for a drug challenge.   More than 90% of patients outgrow their penicillin allergy.

## 2020-03-02 NOTE — Assessment & Plan Note (Addendum)
History of annual sinus infections and pneumonia in 2021.  Denies any other significant infections.  History of prostate cancer in the 1990s and has stage III kidney disease which is followed by nephrology and is pre-diabetic.  Discussed with patient that given his clinical history immunodeficiency is unlikely.  Patient would still like to get blood work done for this and will order basic immune evaluation as below.  Keep track of infections.

## 2020-03-02 NOTE — Progress Notes (Signed)
New Patient Note  RE: Tom Gaines MRN: [id number] DOB: 12-23-1945 Date of Office Visit: 03/02/2020  Referring provider: Binnie Rail, MD Primary care provider: Binnie Rail, MD  Chief Complaint: Allergic Rhinitis  (Runny nose - clear mucus no other symptoms /Sinus infection- 2 years ago /pneumonia- nov-dec time 2021)  History of Present Illness: I had the pleasure of seeing Tom Gaines for initial evaluation at the [facility] on 03/03/2020. He is a 75 y.o. male, who is referred here by Binnie Rail, MD for the evaluation of immunodeficiency and allergic rhinitis.  He reports symptoms of rhinorrhea, nasal congestion and fatigue. Symptoms have been going on for 10+ years. The symptoms are present all year around. Anosmia: diminished sense of smell. Headache: no. He has used Flonase and zyrtec with unknown improvement in symptoms. Sinus infections: 1 per year at most. Previous work up includes: none. Previous ENT evaluation: not recently, no prior surgery. Previous sinus imaging: no. History of nasal polyps: no. History of reflux: no.  Patient has history multiple infections including sinus infection, pneumonia (2021). Denies any ear infections, GI infections/diarrhea, skin infections/abscesses. Patient has no history of opportunistic infections including fungal infections, viral infections.   Patient reports 1 course of antibiotic use in the last 12 months and 0 hospital admissions. Patient does not have any secondary causes of immunodeficiency including chronic steroid use, protein losing enteropathy, hepatic dysfunction, or irradiation or history of HIV, hepatitis B or C.   Patient is prediabetic and has stage 3 kidney disease - follows with nephrology.  Patient had prostate cancer in the 1990s.   Assessment and Plan: Tom Gaines is a 75 y.o. male with: Other allergic rhinitis Perennial rhinitis symptoms for 10+ years.  Tried Flonase and Zyrtec with unknown  benefit.  Usually gets one sinus infection per year.  No prior allergy evaluation.  Today's skin testing showed: Positive to mold, dog, and dust mites.  Start environmental control measures as below.  May use over the counter antihistamines such as Zyrtec (cetirizine), Claritin (loratadine), Allegra (fexofenadine), or Xyzal (levocetirizine) daily as needed.  May use azelastine nasal spray 1-2 sprays per nostril twice a day as needed for runny nose/drainage.  Not sure how much of the above allergens are contributing to his fatigue.   Environmental allergies can lead to increased sinus infections though.   Encounter for screening for diseases of the blood and blood-forming organs and certain disorders involving the immune mechanism History of annual sinus infections and pneumonia in 2021.  Denies any other significant infections.  History of prostate cancer in the 1990s and has stage III kidney disease which is followed by nephrology and is pre-diabetic.  Discussed with patient that given his clinical history immunodeficiency is unlikely.  Patient would still like to get blood work done for this and will order basic immune evaluation as below.  Keep track of infections.  History of penicillin allergy Patient was given penicillin for some kind of skin infection which led to worsening rash/hives.  Not sure if patient had penicillin prior to this event.  Reaction was treated as outpatient.  Consider penicillin skin testing/drug challenge in the future.  If interested we can schedule drug challenge to penicillin. You must be off antihistamines for 3-5 days before. Must be in good health and not ill. Plan on being in the office for 2-3 hours and must bring in the drug you want to do the oral challenge for - will send in  prescription to pick up a few days before. You must call to schedule an appointment and specify it's for a drug challenge.   More than 90% of patients outgrow their penicillin  allergy.   Return for Drug challenge.  Meds ordered this encounter  Medications  . azelastine (ASTELIN) 0.1 % nasal spray    Sig: Place 1-2 sprays into both nostrils 2 (two) times daily as needed (nasal drainage). Use in each nostril as directed    Dispense:  30 mL    Refill:  5    Lab Orders     CBC with Differential/Platelet     Complement, total     IgG, IgA, IgM     IgG 1, 2, 3, and 4     Strep pneumoniae 23 Serotypes IgG     Diphtheria / Tetanus Antibody Panel  Other allergy screening: Asthma: no Food allergy: no Medication allergy: yes  Penicillin - 50 years ago had some type of rash and was given penicillin which worsened the rash/hives. Not sure if he had penicillin before this. Symptoms treated as outpatient.  Hymenoptera allergy: no Urticaria: no Eczema:no  Diagnostics: Skin Testing: Environmental allergy panel. Positive test to: mold, dog, dust mites.  Results discussed with patient/family.  Airborne Adult Perc - 03/02/20 1404    Time Antigen Placed 1404    Allergen Manufacturer Lavella Hammock    Location Back    Number of Test 59    Panel 1 Select    1. Control-Buffer 50% Glycerol Negative    2. Control-Histamine 1 mg/ml 2+    3. Albumin saline Negative    4. [place] Negative    5. [place] Negative    6. Johnson Negative    7. [place] Blue Negative    8. Meadow Fescue Negative    9. Perennial Rye Negative    10. Sweet Vernal Negative    11. Timothy Negative    12. Cocklebur Negative    13. Burweed Marshelder Negative    14. Ragweed, short Negative    15. Ragweed, Giant Negative    16. Plantain,  English Negative    17. Lamb's Quarters Negative    18. Sheep Sorrell Negative    19. Rough Pigweed Negative    20. Marsh Elder, Rough Negative    21. Mugwort, Common Negative    22. Ash mix Negative    23. Birch mix Negative    24. Beech American Negative    25. Box, Elder Negative    26. Cedar, red Negative    27. Cottonwood, [place] Negative    28. Elm  mix Negative    29. Hickory Negative    30. Maple mix Negative    31. Oak, [place] mix Negative    32. Pecan Pollen Negative    33. Pine mix Negative    34. Sycamore Eastern Negative    35. Galva, Black Pollen Negative    36. Alternaria alternata Negative    37. Cladosporium Herbarum Negative    38. Aspergillus mix Negative    39. Penicillium mix Negative    40. Bipolaris sorokiniana (Helminthosporium) Negative    41. Drechslera spicifera (Curvularia) Negative    42. Mucor plumbeus Negative    43. Fusarium moniliforme Negative    44. Aureobasidium pullulans (pullulara) Negative    45. Rhizopus oryzae Negative    46. Botrytis cinera Negative    47. Epicoccum nigrum Negative    48. Phoma betae Negative    49. Candida Albicans Negative  50. Trichophyton mentagrophytes Negative    51. Mite, D Farinae  5,000 AU/ml Negative    52. Mite, D Pteronyssinus  5,000 AU/ml Negative    53. Cat Hair 10,000 BAU/ml Negative    54.  Dog Epithelia Negative    55. Mixed Feathers Negative    56. Horse Epithelia Negative    57. Cockroach, German Negative    58. Mouse Negative    59. Tobacco Leaf Negative    Comments --          Intradermal - 03/02/20 1442    Time Antigen Placed 1442    Allergen Manufacturer Lavella Hammock    Location Arm    Number of Test 15    Intradermal Select    Control Negative    [place] Negative    Johnson Negative    7 Grass Negative    Ragweed mix Negative    Weed mix Negative    Tree mix Negative    Mold 1 --   +/-   Mold 2 2+    Mold 3 Negative    Mold 4 2+    Cat Negative    Dog 2+    Cockroach Negative    Mite mix 2+           Past Medical History: Patient Active Problem List   Diagnosis Date Noted  . Other allergic rhinitis 03/02/2020  . History of penicillin allergy 03/02/2020  . Encounter for screening for diseases of the blood and blood-forming organs and certain disorders involving the immune mechanism 03/02/2020  . Cough 01/06/2020  . Fever  01/06/2020  . Dupuytren's contracture of hand 04/09/2019  . Diastolic dysfunction [id number]  . Ascending aorta dilatation (HCC) 10/11/2018  . 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 Nasal sore 08/27/2018  . Bilateral shoulder pain 10/03/2017  . CKD (chronic kidney disease) 03/31/2017  . Bilateral shoulder bursitis 02/01/2017  . Acute shoulder bursitis, right 05/17/2016  . Prediabetes 03/29/2016  . Nonallopathic lesion of lumbosacral region 12/01/2015  . Nonallopathic lesion of sacral region 12/01/2015  . Nonallopathic lesion of thoracic region 12/01/2015  . Low back pain 03/13/2014  . Basal cell carcinoma 02/13/2013  . Hyperlipidemia 03/12/2008  . History of gout 03/12/2008  . PROSTATE CANCER, HX OF 03/12/2008  . History of colonic polyps 03/12/2008   Past Medical History:  Diagnosis Date  . Allergy   . Arthritis   . Ascending aorta dilatation (HCC) 10/11/2018   Echo 10/11/2018 40 mm  . 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 Bilateral shoulder bursitis 02/01/2017   Bilateral injections February 01, 2017  . Cataract   . CKD (chronic kidney disease) 03/31/2017  . Diastolic dysfunction [id number]   Echo 10/11/18  . History of colonic polyps 03/12/2008   Qualifier: Diagnosis of  By: Linna Darner MD, Gwyndolyn Saxon   02/06/2012  Sessile polyps (2)    . History of gout 03/12/2008   R great toe   . 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 Hyperlipidemia 03/12/2008   NMR LipoProfile 2004: LDL 126 (total particle 1084/small dense particle #80), HDL 73.5. LDL goal equal less than 160, ideally less than 130. No FH MI or CVA  . Low back pain 03/13/2014   Chronic, has seen Dr Tamala Julian and Dr Lynann Bologna Taking tramadol as needed  . Nonallopathic lesion of lumbosacral region 12/01/2015  . Nonallopathic lesion of sacral region 12/01/2015  .  Nonallopathic lesion of thoracic region 12/01/2015  . Prediabetes 03/29/2016   Mom,  sister DM  . PROSTATE CANCER, HX OF 03/12/2008   Qualifier: Diagnosis of  By: Linna Darner MD, [facility]   Radical prostatectomy 1998, Urologist Dr. Lawerance Bach    Past Surgical History: Past Surgical History:  Procedure Laterality Date  . CATARACT EXTRACTION, BILATERAL  2017  . colonoscopy with polypectomy  2014   Dr Fuller Plan  . LUMBAR DISC SURGERY  2002   Dr Ellene Route  . PROSTATECTOMY  1998  . SHOULDER ARTHROSCOPY     right; 2011; left 1990  . TONSILLECTOMY AND ADENOIDECTOMY     Medication List:  Current Outpatient Medications  Medication Sig Dispense Refill  . acetaminophen (TYLENOL) 500 MG tablet Take 500 mg by mouth every 6 (six) hours as needed.    Marland Kitchen allopurinol (ZYLOPRIM) 300 MG tablet TAKE 1 TABLET EVERY DAY 90 tablet 1  . azelastine (ASTELIN) 0.1 % nasal spray Place 1-2 sprays into both nostrils 2 (two) times daily as needed (nasal drainage). Use in each nostril as directed 30 mL 5  . traMADol (ULTRAM) 50 MG tablet Take 1 tablet (50 mg total) by mouth daily. For chronic back pain 90 tablet 0  . Turmeric Curcumin 500 MG CAPS Take 500 mg by mouth 3 (three) times daily.     Marland Kitchen acyclovir (ZOVIRAX) 800 MG tablet Take 400 mg by mouth 2 (two) times daily.  (Patient not taking: Reported on 03/02/2020)    . Alpha-Lipoic Acid 600 MG CAPS Take by mouth.    . Ascorbic Acid (VITAMIN C) 100 MG tablet Take 100 mg by mouth daily.    Marland Kitchen azithromycin (ZITHROMAX) 250 MG tablet Take two tabs the first day and then one tab daily for four days 6 tablet 0  . Capsicum, Cayenne, (CAYENNE PO) Take by mouth.    . cetirizine (ZYRTEC) 10 MG tablet Take 10 mg by mouth as needed.     . Cholecalciferol (VITAMIN D) 2000 units CAPS Take 1 capsule by mouth daily.    . fenofibrate (TRICOR) 145 MG tablet TAKE 1 TABLET BY MOUTH  DAILY 90 tablet 3  . FOLIC ACID PO Take [id number] mcg by mouth.    . L-CITRULLINE PO Take by mouth.    . medium chain triglycerides (MCT OIL) oil Take by mouth 3 (three) times daily.    . Multiple Vitamin  (MULTIVITAMIN) tablet Take 1 tablet by mouth daily.      . Omega-3 Fatty Acids (FISH OIL) 1000 MG CAPS Take 1,000 mg by mouth. 1 by mouth daily    . Probiotic Product (PROBIOTIC ADVANCED PO) Take by mouth. (Patient not taking: Reported on 03/02/2020)     No current facility-administered medications for this visit.   Allergies: Allergies  Allergen Reactions  . 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 Marital status: Married    Spouse name: Not on file  . Number of children: Not on file  . Years of education: Not on file  . Highest education level: Not on file  Occupational History  . Occupation: retired  Tobacco Use  . Smoking status: Former Smoker    Quit date: 01/31/1985    Years since quitting: 35.1  . Smokeless tobacco: Never Used  . Tobacco comment: smoked  [place], up to 2.5 ppd  Vaping Use  .  Vaping Use: Never used  Substance and Sexual Activity  . Alcohol use: No    Comment:  none since 2012  . Drug use: No  . Sexual activity: Yes  Other Topics Concern  . Not on file  Social History Narrative   Exercises regularly - gym 5 days a week   Social Determinants of Health   Financial Resource Strain: Low Risk   . Difficulty of Paying Living Expenses: Not hard at all  Food Insecurity: No Food Insecurity  . Worried About Charity fundraiser in the Last Year: Never true  . Ran Out of Food in the Last Year: Never true  Transportation Needs: No Transportation Needs  . Lack of Transportation (Medical): No  . Lack of Transportation (Non-Medical): No  Physical Activity: Sufficiently Active  . Days of Exercise per Week: 5 days  . Minutes of Exercise per Session: 30 min  Stress: No Stress Concern Present  . Feeling of Stress : Not at all  Social Connections: Socially Integrated  . Frequency of Communication with Friends and Family: More than three times a  week  . Frequency of Social Gatherings with Friends and Family: More than three times a week  . Attends Religious Services: More than 4 times per year  . Active Member of Clubs or Organizations: Yes  . Attends Archivist Meetings: More than 4 times per year  . Marital Status: Married   Lives in a [address] year old townhome. Smoking: smoked 40 years ago Occupation: retired  Programme researcher, broadcasting/film/video History: Environmental education officer in the house: no Charity fundraiser in the family room: yes Carpet in the bedroom: yes Heating: gas Cooling: central Pet: yes 2 dogs x 3 yrs  Family History: Family History  Problem Relation Age of Onset  . Diabetes Mother   . Dementia Mother   . Diabetes Sister   . Lung cancer Other        maternal uncle & aunt  . Colon cancer Neg Hx   . Stomach cancer Neg Hx   . Arthritis Neg Hx   . Heart disease Neg Hx   . Stroke Neg Hx   . Esophageal cancer Neg Hx   . Liver cancer Neg Hx   . Pancreatic cancer Neg Hx   . Rectal cancer Neg Hx    Problem                               Relation Asthma                                   No  Eczema                                No  Food allergy                          No  Allergic rhino conjunctivitis     No  Immunodeficiency   No  Review of Systems  Constitutional: Positive for fatigue. Negative for appetite change, chills, fever and unexpected weight change.  HENT: Positive for congestion and rhinorrhea.   Eyes: Negative for itching.  Respiratory: Negative for cough, chest tightness, shortness of breath and wheezing.   Cardiovascular: Negative for chest pain.  Gastrointestinal: Negative for abdominal pain.  Genitourinary: Negative for difficulty urinating.  Skin: Negative for rash.  Allergic/Immunologic: Positive for environmental allergies.  Neurological: Negative for headaches.   Objective: BP 128/78   Pulse 89   Temp 98.7 F (37.1 C)   Resp 18   Ht 5\' 11"  (3.419 m)   Wt 181 lb (82.1 kg)   SpO2 95%   BMI 25.24  kg/m  Body mass index is 25.24 kg/m. Physical Exam Vitals and nursing note reviewed.  Constitutional:      Appearance: Normal appearance. He is well-developed.  HENT:     Head: Normocephalic and atraumatic.     Right Ear: Tympanic membrane and external ear normal.     Left Ear: Tympanic membrane and external ear normal.     Nose: Nose normal.     Mouth/Throat:     Mouth: Mucous membranes are moist.     Pharynx: Oropharynx is clear.  Eyes:     Conjunctiva/sclera: Conjunctivae normal.  Cardiovascular:     Rate and Rhythm: Normal rate and regular rhythm.     Heart sounds: Normal heart sounds. No murmur heard. No friction rub. No gallop.   Pulmonary:     Effort: Pulmonary effort is normal.     Breath sounds: Normal breath sounds. No wheezing, rhonchi or rales.  Musculoskeletal:     Cervical back: Neck supple.  Skin:    General: Skin is warm.     Findings: No rash.  Neurological:     Mental Status: He is alert and oriented to person, place, and time.  Psychiatric:        Behavior: Behavior normal.    The plan was reviewed with the patient/family, and all questions/concerned were addressed.  It was my pleasure to see Tom Gaines today and participate in his care. Please feel free to contact me with any questions or concerns.  Sincerely,  Rexene Alberts, DO Allergy & Immunology  [facility] office: [place] office: [phone]

## 2020-03-02 NOTE — Assessment & Plan Note (Addendum)
Perennial rhinitis symptoms for 10+ years.  Tried Flonase and Zyrtec with unknown benefit.  Usually gets one sinus infection per year.  No prior allergy evaluation.  Today's skin testing showed: Positive to mold, dog, and dust mites.  Start environmental control measures as below.  May use over the counter antihistamines such as Zyrtec (cetirizine), Claritin (loratadine), Allegra (fexofenadine), or Xyzal (levocetirizine) daily as needed.  May use azelastine nasal spray 1-2 sprays per nostril twice a day as needed for runny nose/drainage.  Not sure how much of the above allergens are contributing to his fatigue.   Environmental allergies can lead to increased sinus infections though.

## 2020-03-09 ENCOUNTER — Other Ambulatory Visit: Payer: Self-pay | Admitting: Internal Medicine

## 2020-03-09 DIAGNOSIS — E7211 Homocystinuria: Secondary | ICD-10-CM | POA: Diagnosis not present

## 2020-03-09 DIAGNOSIS — E785 Hyperlipidemia, unspecified: Secondary | ICD-10-CM | POA: Diagnosis not present

## 2020-03-09 DIAGNOSIS — N2581 Secondary hyperparathyroidism of renal origin: Secondary | ICD-10-CM | POA: Diagnosis not present

## 2020-03-09 DIAGNOSIS — N1831 Chronic kidney disease, stage 3a: Secondary | ICD-10-CM | POA: Diagnosis not present

## 2020-03-09 DIAGNOSIS — R5383 Other fatigue: Secondary | ICD-10-CM | POA: Diagnosis not present

## 2020-03-09 DIAGNOSIS — I5189 Other ill-defined heart diseases: Secondary | ICD-10-CM | POA: Diagnosis not present

## 2020-03-09 DIAGNOSIS — I77810 Thoracic aortic ectasia: Secondary | ICD-10-CM | POA: Diagnosis not present

## 2020-03-09 DIAGNOSIS — M109 Gout, unspecified: Secondary | ICD-10-CM | POA: Diagnosis not present

## 2020-03-09 DIAGNOSIS — R7303 Prediabetes: Secondary | ICD-10-CM | POA: Diagnosis not present

## 2020-03-12 ENCOUNTER — Encounter: Payer: Self-pay | Admitting: Allergy

## 2020-03-12 DIAGNOSIS — M545 Low back pain, unspecified: Secondary | ICD-10-CM | POA: Diagnosis not present

## 2020-03-12 LAB — STREP PNEUMONIAE 23 SEROTYPES IGG
Pneumo Ab Type 1*: 0.4 ug/mL — ABNORMAL LOW (ref >1.3)
Pneumo Ab Type 12 (12F)*: 0.2 ug/mL — ABNORMAL LOW (ref >1.3)
Pneumo Ab Type 14*: 5 ug/mL (ref >1.3)
Pneumo Ab Type 17 (17F)*: 0.3 ug/mL — ABNORMAL LOW (ref >1.3)
Pneumo Ab Type 19 (19F)*: 1.8 ug/mL (ref >1.3)
Pneumo Ab Type 2*: 1.4 ug/mL (ref >1.3)
Pneumo Ab Type 20*: 1.8 ug/mL (ref >1.3)
Pneumo Ab Type 22 (22F)*: 0.3 ug/mL — ABNORMAL LOW (ref >1.3)
Pneumo Ab Type 23 (23F)*: 0.4 ug/mL — ABNORMAL LOW (ref >1.3)
Pneumo Ab Type 26 (6B)*: 0.2 ug/mL — ABNORMAL LOW (ref >1.3)
Pneumo Ab Type 3*: 1.9 ug/mL (ref >1.3)
Pneumo Ab Type 34 (10A)*: 1.5 ug/mL (ref >1.3)
Pneumo Ab Type 4*: 1.3 ug/mL — ABNORMAL LOW (ref >1.3)
Pneumo Ab Type 43 (11A)*: 0.5 ug/mL — ABNORMAL LOW (ref >1.3)
Pneumo Ab Type 5*: 0.3 ug/mL — ABNORMAL LOW (ref >1.3)
Pneumo Ab Type 51 (7F)*: 0.6 ug/mL — ABNORMAL LOW (ref >1.3)
Pneumo Ab Type 54 (15B)*: 2.3 ug/mL (ref >1.3)
Pneumo Ab Type 56 (18C)*: 0.2 ug/mL — ABNORMAL LOW (ref >1.3)
Pneumo Ab Type 57 (19A)*: 1.2 ug/mL — ABNORMAL LOW (ref >1.3)
Pneumo Ab Type 68 (9V)*: 1.3 ug/mL — ABNORMAL LOW (ref >1.3)
Pneumo Ab Type 70 (33F)*: 1.1 ug/mL — ABNORMAL LOW (ref >1.3)
Pneumo Ab Type 8*: 3.6 ug/mL (ref >1.3)
Pneumo Ab Type 9 (9N)*: 1 ug/mL — ABNORMAL LOW (ref >1.3)

## 2020-03-12 LAB — IGG 1, 2, 3, AND 4
IgG (Immunoglobin G), Serum: 945 mg/dL (ref 603–1613)
IgG, Subclass 1: 459 mg/dL (ref 248–810)
IgG, Subclass 2: 327 mg/dL (ref 130–555)
IgG, Subclass 3: 47 mg/dL (ref 15–102)
IgG, Subclass 4: 85 mg/dL (ref 2–96)

## 2020-03-12 LAB — CBC WITH DIFFERENTIAL/PLATELET
Basophils Absolute: 0 10*3/uL (ref 0.0–0.2)
Basos: 1 %
EOS (ABSOLUTE): 0.1 10*3/uL (ref 0.0–0.4)
Eos: 2 %
Hematocrit: 43.9 % (ref 37.5–51.0)
Hemoglobin: 14.7 g/dL (ref 13.0–17.7)
Immature Grans (Abs): 0 10*3/uL (ref 0.0–0.1)
Immature Granulocytes: 0 %
Lymphocytes Absolute: 1.4 10*3/uL (ref 0.7–3.1)
Lymphs: 23 %
MCH: 31.1 pg (ref 26.6–33.0)
MCHC: 33.5 g/dL (ref 31.5–35.7)
MCV: 93 fL (ref 79–97)
Monocytes Absolute: 0.7 10*3/uL (ref 0.1–0.9)
Monocytes: 11 %
Neutrophils Absolute: 3.9 10*3/uL (ref 1.4–7.0)
Neutrophils: 63 %
Platelets: 232 10*3/uL (ref 150–450)
RBC: 4.72 x10E6/uL (ref 4.14–5.80)
RDW: 14.4 % (ref 11.6–15.4)
WBC: 6.1 10*3/uL (ref 3.4–10.8)

## 2020-03-12 LAB — DIPHTHERIA / TETANUS ANTIBODY PANEL
Diphtheria Ab: 0.33 IU/mL (ref <0.10)
Tetanus Ab, IgG: 1.26 IU/mL (ref <0.10)

## 2020-03-12 LAB — IGG, IGA, IGM
IgA/Immunoglobulin A, Serum: 101 mg/dL (ref 61–437)
IgM (Immunoglobulin M), Srm: 37 mg/dL (ref 15–143)

## 2020-03-12 LAB — COMPLEMENT, TOTAL: Compl, Total (CH50): >60 U/mL (ref >41)

## 2020-03-13 ENCOUNTER — Encounter: Payer: Self-pay | Admitting: Internal Medicine

## 2020-03-19 ENCOUNTER — Ambulatory Visit (INDEPENDENT_AMBULATORY_CARE_PROVIDER_SITE_OTHER): Payer: Medicare Other

## 2020-03-19 ENCOUNTER — Other Ambulatory Visit: Payer: Self-pay

## 2020-03-19 DIAGNOSIS — Z23 Encounter for immunization: Secondary | ICD-10-CM

## 2020-03-26 DIAGNOSIS — C61 Malignant neoplasm of prostate: Secondary | ICD-10-CM | POA: Diagnosis not present

## 2020-03-31 DIAGNOSIS — M545 Low back pain, unspecified: Secondary | ICD-10-CM | POA: Diagnosis not present

## 2020-04-13 ENCOUNTER — Other Ambulatory Visit (INDEPENDENT_AMBULATORY_CARE_PROVIDER_SITE_OTHER): Payer: Medicare Other

## 2020-04-13 DIAGNOSIS — Z8739 Personal history of other diseases of the musculoskeletal system and connective tissue: Secondary | ICD-10-CM

## 2020-04-13 DIAGNOSIS — E7211 Homocystinuria: Secondary | ICD-10-CM

## 2020-04-13 DIAGNOSIS — E7849 Other hyperlipidemia: Secondary | ICD-10-CM

## 2020-04-13 DIAGNOSIS — R7983 Abnormal findings of blood amino-acid level: Secondary | ICD-10-CM

## 2020-04-13 DIAGNOSIS — N1832 Chronic kidney disease, stage 3b: Secondary | ICD-10-CM

## 2020-04-13 DIAGNOSIS — N182 Chronic kidney disease, stage 2 (mild): Secondary | ICD-10-CM | POA: Diagnosis not present

## 2020-04-13 DIAGNOSIS — R7303 Prediabetes: Secondary | ICD-10-CM

## 2020-04-13 LAB — COMPREHENSIVE METABOLIC PANEL
ALT: 15 U/L (ref 0–53)
AST: 25 U/L (ref 0–37)
Albumin: 4.2 g/dL (ref 3.5–5.2)
Alkaline Phosphatase: 40 U/L (ref 39–117)
BUN: 28 mg/dL — ABNORMAL HIGH (ref 6–23)
CO2: 28 mEq/L (ref 19–32)
Calcium: 10 mg/dL (ref 8.4–10.5)
Chloride: 105 mEq/L (ref 96–112)
Creatinine, Ser: 1.72 mg/dL — ABNORMAL HIGH (ref 0.40–1.50)
GFR: 38.55 mL/min — ABNORMAL LOW (ref >60.00)
Glucose, Bld: 89 mg/dL (ref 70–99)
Potassium: 4.4 mEq/L (ref 3.5–5.1)
Sodium: 140 mEq/L (ref 135–145)
Total Bilirubin: 0.6 mg/dL (ref 0.2–1.2)
Total Protein: 7.1 g/dL (ref 6.0–8.3)

## 2020-04-13 LAB — CBC WITH DIFFERENTIAL/PLATELET
Basophils Absolute: 0 10*3/uL (ref 0.0–0.1)
Basophils Relative: 0.6 % (ref 0.0–3.0)
Eosinophils Absolute: 0.3 10*3/uL (ref 0.0–0.7)
Eosinophils Relative: 4.2 % (ref 0.0–5.0)
HCT: 43 % (ref 39.0–52.0)
Hemoglobin: 14.4 g/dL (ref 13.0–17.0)
Lymphocytes Relative: 20.5 % (ref 12.0–46.0)
Lymphs Abs: 1.4 10*3/uL (ref 0.7–4.0)
MCHC: 33.5 g/dL (ref 30.0–36.0)
MCV: 94.8 fl (ref 78.0–100.0)
Monocytes Absolute: 0.6 10*3/uL (ref 0.1–1.0)
Monocytes Relative: 9.3 % (ref 3.0–12.0)
Neutro Abs: 4.4 10*3/uL (ref 1.4–7.7)
Neutrophils Relative %: 65.4 % (ref 43.0–77.0)
Platelets: 233 10*3/uL (ref 150.0–400.0)
RBC: 4.53 Mil/uL (ref 4.22–5.81)
RDW: 15.4 % (ref 11.5–15.5)
WBC: 6.7 10*3/uL (ref 4.0–10.5)

## 2020-04-13 LAB — LIPID PANEL
Cholesterol: 154 mg/dL (ref 0–200)
HDL: 56.3 mg/dL (ref >39.00)
LDL Cholesterol: 83 mg/dL (ref 0–99)
NonHDL: 97.49
Total CHOL/HDL Ratio: 3
Triglycerides: 73 mg/dL (ref 0.0–149.0)
VLDL: 14.6 mg/dL (ref 0.0–40.0)

## 2020-04-13 LAB — URIC ACID: Uric Acid, Serum: 5.2 mg/dL (ref 4.0–7.8)

## 2020-04-13 LAB — HEMOGLOBIN A1C: Hgb A1c MFr Bld: 5.6 % (ref 4.6–6.5)

## 2020-04-13 LAB — FOLATE: Folate: >23.6 ng/mL (ref >5.9)

## 2020-04-13 NOTE — Addendum Note (Signed)
Addended by: Trenda Moots on: 08/27/6182 08:41 AM   Modules accepted: Orders

## 2020-04-14 ENCOUNTER — Telehealth: Payer: Self-pay | Admitting: Allergy

## 2020-04-14 ENCOUNTER — Ambulatory Visit (INDEPENDENT_AMBULATORY_CARE_PROVIDER_SITE_OTHER): Payer: Medicare Other | Admitting: Internal Medicine

## 2020-04-14 ENCOUNTER — Encounter: Payer: Self-pay | Admitting: Internal Medicine

## 2020-04-14 ENCOUNTER — Other Ambulatory Visit: Payer: Self-pay

## 2020-04-14 VITALS — BP 120/78 | HR 56 | Temp 98.2°F | Ht 71.0 in | Wt 179.0 lb

## 2020-04-14 DIAGNOSIS — Z8739 Personal history of other diseases of the musculoskeletal system and connective tissue: Secondary | ICD-10-CM | POA: Diagnosis not present

## 2020-04-14 DIAGNOSIS — E7849 Other hyperlipidemia: Secondary | ICD-10-CM | POA: Diagnosis not present

## 2020-04-14 DIAGNOSIS — N1832 Chronic kidney disease, stage 3b: Secondary | ICD-10-CM

## 2020-04-14 DIAGNOSIS — G8929 Other chronic pain: Secondary | ICD-10-CM | POA: Diagnosis not present

## 2020-04-14 DIAGNOSIS — M545 Low back pain, unspecified: Secondary | ICD-10-CM

## 2020-04-14 DIAGNOSIS — M2550 Pain in unspecified joint: Secondary | ICD-10-CM | POA: Diagnosis not present

## 2020-04-14 DIAGNOSIS — R7303 Prediabetes: Secondary | ICD-10-CM | POA: Diagnosis not present

## 2020-04-14 DIAGNOSIS — E7211 Homocystinuria: Secondary | ICD-10-CM | POA: Diagnosis not present

## 2020-04-14 MED ORDER — AMOXICILLIN 250 MG/5ML PO SUSR: ORAL | 0 refills | Status: DC

## 2020-04-14 NOTE — Assessment & Plan Note (Signed)
Chronic No issues with gout since his last visit Uric acid well-controlled Continue allopurinol 300 mg daily

## 2020-04-14 NOTE — Patient Instructions (Addendum)
    Blood work was ordered.      Medications changes include :   none     Please followup in 6 months  

## 2020-04-14 NOTE — Progress Notes (Signed)
Subjective:    Patient ID: Tom Gaines, male    DOB: [birth year], 75 y.o.   MRN: [id number]  HPI The patient is here for follow up of their chronic medical problems, including prediabetes, hyperlipidemia, ckd, gout, homocystinemia, lower back pain, b/l shoulder pain   His GFR is lower - he is unsure why.  It is similar to what it was 1 year ago and stable using that as a reference.  He is drinking plenty of fluids.  He is taking some additional supplementation, but denies any other changes.     Medications and allergies reviewed with patient and updated if appropriate.  Patient Active Problem List   Diagnosis Date Noted  . Other allergic rhinitis 03/02/2020  . History of penicillin allergy 03/02/2020  . Encounter for screening for diseases of the blood and blood-forming organs and certain disorders involving the immune mechanism 03/02/2020  . Dupuytren's contracture of hand 04/09/2019  . Diastolic dysfunction 15/17/6160  . Ascending aorta dilatation (HCC) 10/11/2018  . 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 Nasal sore 08/27/2018  . Bilateral shoulder pain 10/03/2017  . CKD (chronic kidney disease) 03/31/2017  . Bilateral shoulder bursitis 02/01/2017  . Prediabetes 03/29/2016  . Nonallopathic lesion of lumbosacral region 12/01/2015  . Nonallopathic lesion of sacral region 12/01/2015  . Nonallopathic lesion of thoracic region 12/01/2015  . Low back pain 03/13/2014  . Basal cell carcinoma 02/13/2013  . Hyperlipidemia 03/12/2008  . History of gout 03/12/2008  . PROSTATE CANCER, HX OF 03/12/2008  . History of colonic polyps 03/12/2008    Current Outpatient Medications on File Prior to Visit  Medication Sig Dispense Refill  . acetaminophen (TYLENOL) 500 MG tablet Take 500 mg by mouth every 6 (six) hours as needed.    Marland Kitchen allopurinol (ZYLOPRIM) 300 MG tablet TAKE 1 TABLET EVERY DAY 90 tablet 1  . Alpha-Lipoic Acid 600 MG CAPS Take by mouth.    . Ascorbic Acid (VITAMIN C) 100 MG  tablet Take 100 mg by mouth daily.    . ASPIRIN 81 PO Take by mouth.    Marland Kitchen azelastine (ASTELIN) 0.1 % nasal spray Place 1-2 sprays into both nostrils 2 (two) times daily as needed (nasal drainage). Use in each nostril as directed 30 mL 5  . BREWERS YEAST PO Take by mouth.    . Capsicum, Cayenne, (CAYENNE PO) Take by mouth.    . cetirizine (ZYRTEC) 10 MG tablet Take 10 mg by mouth as needed.     . Cholecalciferol (VITAMIN D) 2000 units CAPS Take 1 capsule by mouth daily.    . Cyanocobalamin (B-12) 2000 MCG TABS Take 2,000 mcg by mouth.    . fenofibrate (TRICOR) 145 MG tablet TAKE 1 TABLET EVERY DAY 90 tablet 2  . FOLIC ACID PO Take 7,371 mcg by mouth.    . Ginger, Zingiber officinalis, 500 MG CAPS Take by mouth.    . INULIN PO Take by mouth. Patient takes 1/4 teaspoon    . L-CITRULLINE PO Take by mouth.    . LYSINE PO Take 1,000 mg by mouth.    . medium chain triglycerides (MCT OIL) oil Take by mouth 3 (three) times daily.    . Menaquinone-7 (VITAMIN K2 PO) Take 120 mg by mouth daily.    . Multiple Vitamin (MULTIVITAMIN) tablet Take 1 tablet by mouth daily.    . Omega-3 Fatty Acids (FISH OIL) 1000 MG CAPS Take 1,000 mg by mouth. 1 by mouth daily    . RESVERATROL PO  Take by mouth. Patient takes bid    . traMADol (ULTRAM) 50 MG tablet Take 1 tablet (50 mg total) by mouth daily. For chronic back pain 90 tablet 0  . Turmeric Curcumin 500 MG CAPS Take 500 mg by mouth 3 (three) times daily.      No current facility-administered medications on file prior to visit.    Past Medical History:  Diagnosis Date  . Allergy   . Arthritis   . Ascending aorta dilatation (HCC) 10/11/2018   Echo 10/11/2018 40 mm  . 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 Bilateral shoulder bursitis 02/01/2017   Bilateral injections February 01, 2017  . Cataract   . CKD (chronic kidney disease) 03/31/2017  . Diastolic  dysfunction 09/29/5619   Echo 10/11/18  . History of colonic polyps 03/12/2008   Qualifier: Diagnosis of  By: Linna Darner MD, Gwyndolyn Saxon   02/06/2012  Sessile polyps (2)    . History of gout 03/12/2008   R great toe   . 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 Hyperlipidemia 03/12/2008   NMR LipoProfile 2004: LDL 126 (total particle 1084/small dense particle #80), HDL 73.5. LDL goal equal less than 160, ideally less than 130. No FH MI or CVA  . Low back pain 03/13/2014   Chronic, has seen Dr Tamala Julian and Dr Lynann Bologna Taking tramadol as needed  . Nonallopathic lesion of lumbosacral region 12/01/2015  . Nonallopathic lesion of sacral region 12/01/2015  . Nonallopathic lesion of thoracic region 12/01/2015  . Prediabetes 03/29/2016   Mom, sister DM  . PROSTATE CANCER, HX OF 03/12/2008   Qualifier: Diagnosis of  By: Linna Darner MD, [facility]   Radical prostatectomy 1998, Urologist Dr. Lawerance Bach     Past Surgical History:  Procedure Laterality Date  . CATARACT EXTRACTION, BILATERAL  2017  . colonoscopy with polypectomy  2014   Dr Fuller Plan  . LUMBAR DISC SURGERY  2002   Dr Ellene Route  . PROSTATECTOMY  1998  . SHOULDER ARTHROSCOPY     right; 2011; left 1990  . TONSILLECTOMY AND ADENOIDECTOMY      Social History   Socioeconomic History  . Marital status: Married    Spouse name: Not on file  . Number of children: Not on file  . Years of education: Not on file  . Highest education level: Not on file  Occupational History  . Occupation: retired  Tobacco Use  . Smoking status: Former Smoker    Quit date: 01/31/1985    Years since quitting: 35.2  . Smokeless tobacco: Never Used  . Tobacco comment: smoked  [place], up to 2.5 ppd  Vaping Use  . Vaping Use: Never used  Substance and Sexual Activity  . Alcohol use: No    Comment:  none since 2012  . Drug use: No  . Sexual activity: Yes  Other Topics Concern  . Not on file  Social History Narrative   Exercises regularly - gym 5 days a week   Social Determinants of  Health   Financial Resource Strain: Low Risk   . Difficulty of Paying Living Expenses: Not hard at all  Food Insecurity: No Food Insecurity  . Worried About Charity fundraiser in the Last Year: Never true  . Ran Out of Food in the Last Year: Never true  Transportation Needs: No Transportation Needs  . Lack of Transportation (Medical): No  .  Lack of Transportation (Non-Medical): No  Physical Activity: Sufficiently Active  . Days of Exercise per Week: 5 days  . Minutes of Exercise per Session: 30 min  Stress: No Stress Concern Present  . Feeling of Stress : Not at all  Social Connections: Socially Integrated  . Frequency of Communication with Friends and Family: More than three times a week  . Frequency of Social Gatherings with Friends and Family: More than three times a week  . Attends Religious Services: More than 4 times per year  . Active Member of Clubs or Organizations: Yes  . Attends Archivist Meetings: More than 4 times per year  . Marital Status: Married    Family History  Problem Relation Age of Onset  . Diabetes Mother   . Dementia Mother   . Diabetes Sister   . Lung cancer Other        maternal uncle & aunt  . Colon cancer Neg Hx   . Stomach cancer Neg Hx   . Arthritis Neg Hx   . Heart disease Neg Hx   . Stroke Neg Hx   . Esophageal cancer Neg Hx   . Liver cancer Neg Hx   . Pancreatic cancer Neg Hx   . Rectal cancer Neg Hx     Review of Systems  Constitutional: Positive for fatigue (in afternoon). Negative for chills and fever.  Respiratory: Negative for cough, shortness of breath and wheezing.   Cardiovascular: Negative for chest pain, palpitations and leg swelling.  Musculoskeletal: Positive for arthralgias (mild in hands - intermittent) and back pain.  Skin: Negative for rash.  Neurological: Negative for light-headedness and headaches.       Objective:   Vitals:   04/14/20 1527  BP: 120/78  Pulse: (!) 56  Temp: 98.2 F (36.8 C)   SpO2: 96%   BP Readings from Last 3 Encounters:  04/14/20 120/78  03/02/20 128/78  01/06/20 116/78   Wt Readings from Last 3 Encounters:  04/14/20 179 lb (81.2 kg)  03/02/20 181 lb (82.1 kg)  01/06/20 174 lb (78.9 kg)   Body mass index is 24.97 kg/m.   Physical Exam    Constitutional: Appears well-developed and well-nourished. No distress.  HENT:  Head: Normocephalic and atraumatic.  Neck: Neck supple. No tracheal deviation present. No thyromegaly present.  No cervical lymphadenopathy Cardiovascular: Normal rate, regular rhythm and normal heart sounds.   No murmur heard. No carotid bruit .  No edema Pulmonary/Chest: Effort normal and breath sounds normal. No respiratory distress. No has no wheezes. No rales.  Skin: Skin is warm and dry. Not diaphoretic.  Psychiatric: Normal mood and affect. Behavior is normal.      Assessment & Plan:    See Problem List for Assessment and Plan of chronic medical problems.    This visit occurred during the SARS-CoV-2 public health emergency.  Safety protocols were in place, including screening questions prior to the visit, additional usage of staff PPE, and extensive cleaning of exam room while observing appropriate contact time as indicated for disinfecting solutions.

## 2020-04-14 NOTE — Assessment & Plan Note (Signed)
Chronic Continue folate supplementation We will check homocysteine level at his next visit

## 2020-04-14 NOTE — Telephone Encounter (Signed)
Please call patient to confirm drug challenge for penicillin next week on 04/20/20.  Must be off antihistamines for 3-5 days before. Must be in good health and not ill. No vaccines/injections within the past 7 days. Not on any antibiotics.   Plan on being in the office for 2-3 hours and must bring in the medication you want to do the oral challenge for - sent Rx to pharmacy.  Thank you.

## 2020-04-14 NOTE — Assessment & Plan Note (Signed)
Chronic Currently doing physical therapy and that has helped Takes tramadol daily as needed, but not on a daily basis and this is helpful Continue physical therapy, regular exercising Continue tramadol 50 mg daily as needed

## 2020-04-14 NOTE — Assessment & Plan Note (Signed)
Chronic Lipids well controlled Continue TriCor 145 mg daily

## 2020-04-14 NOTE — Assessment & Plan Note (Signed)
Chronic  Lab Results  Component Value Date   HGBA1C 5.6 04/13/2020   Sugars in the normal range over the past 3 months Continue low sugar/carbohydrate diet and regular exercise

## 2020-04-14 NOTE — Assessment & Plan Note (Addendum)
Chronic Decrease gfr compared to the fall - stable compared to one year ago Following with nephrology-we will have recent blood work faxed over to them Continue increased hydration Blood pressure very well controlled, sugars very well controlled

## 2020-04-16 NOTE — Telephone Encounter (Signed)
Simonne Martinet,  Can you help out with this phone call?  Thanks

## 2020-04-16 NOTE — Telephone Encounter (Signed)
It looks as if Patsy already called and confirmed with patient.

## 2020-04-17 ENCOUNTER — Other Ambulatory Visit: Payer: Self-pay | Admitting: Internal Medicine

## 2020-04-20 ENCOUNTER — Encounter: Payer: Self-pay | Admitting: Allergy

## 2020-04-20 ENCOUNTER — Other Ambulatory Visit: Payer: Self-pay

## 2020-04-20 ENCOUNTER — Encounter: Payer: Medicare Other | Admitting: Allergy

## 2020-04-20 VITALS — BP 120/76 | HR 64 | Temp 98.7°F | Resp 18 | Ht 71.0 in | Wt 180.6 lb

## 2020-04-20 NOTE — Progress Notes (Deleted)
Follow Up Note  RE: Tom Gaines MRN: [id number] DOB: 10/23/1945 Date of Office Visit: 04/20/2020  Referring provider: Binnie Rail, MD Primary care provider: Binnie Rail, MD  Chief Complaint: No chief complaint on file.  Assessment and Plan: Tom Gaines is a 75 y.o. male with: No problem-specific Assessment & Plan notes found for this encounter.  No follow-ups on file.  Plan: Challenge drug: *** Challenge as per protocol: {Blank single:19197::"Passed","Failed"} Total time: ***  For next 24 hours monitor for hives, swelling, shortness of breath and dizziness. If you see these symptoms, use Benadryl for mild symptoms and epinephrine for more severe symptoms and call 911.  If no adverse symptoms in the next 24 hours then will take off drug from allergy list.  History of Present Illness: I had the pleasure of seeing Tom Gaines for a follow up visit at the [facility] [place] [facility] [place] [facility] on 04/20/2020. Tom Gaines is a 75 y.o. male, who is being followed for penicillin allergy, allergic rhinitis. His previous allergy office visit was on 03/02/2020 with Dr. Maudie Mercury. Today Tom Gaines is here for penicillin drug testing and challenge.    I reviewed your bloodwork. Your blood counts, immunoglobulin levels (looks at overall immune system), tetanus/diptheria titers (checks for some parts of the immune function) were all normal which is great.   Your pneumococcal titers were borderline. This is important as a lot of the bacterial upper respiratory infections are caused by a bacteria called s. Pneumonia. When is the last time you got a pneumonia vaccine? I would recommend to get a booster pneumovax vaccine and then repeat this bloodwork after 4 weeks to make sure your immune system responded to the vaccine appropriately.  History of Reaction: Other allergic rhinitis Perennial rhinitis symptoms for 10+ years.  Tried Flonase and Zyrtec with unknown benefit.  Usually gets one sinus infection per year.  No  prior allergy evaluation.  Today's skin testing showed: Positive to mold, dog, and dust mites.  Start environmental control measures as below.  May use over the counter antihistamines such as Zyrtec (cetirizine), Claritin (loratadine), Allegra (fexofenadine), or Xyzal (levocetirizine) daily as needed.  May use azelastine nasal spray 1-2 sprays per nostril twice a day as needed for runny nose/drainage.  Not sure how much of the above allergens are contributing to his fatigue.   Environmental allergies can lead to increased sinus infections though.   Encounter for screening for diseases of the blood and blood-forming organs and certain disorders involving the immune mechanism History of annual sinus infections and pneumonia in 2021.  Denies any other significant infections.  History of prostate cancer in the 1990s and has stage III kidney disease which is followed by nephrology and is pre-diabetic.  Discussed with Tom Gaines that given his clinical history immunodeficiency is unlikely.  Tom Gaines would still like to get blood work done for this and will order basic immune evaluation as below.  Keep track of infections.  History of penicillin allergy Tom Gaines was given penicillin for some kind of skin infection which led to worsening rash/hives.  Not sure if Tom Gaines had penicillin prior to this event.  Reaction was treated as outpatient.  Consider penicillin skin testing/drug challenge in the future.  If interested we can schedule drug challenge to penicillin. You must be off antihistamines for 3-5 days before. Must be in good health and not ill. Plan on being in the office for 2-3 hours and must bring in the drug you want to do the oral challenge  for - will send in prescription to pick up a few days before. You must call to schedule an appointment and specify it's for a drug challenge.   More than 90% of patients outgrow their penicillin allergy.   Return for Drug challenge.  Interval  History: Tom Gaines has not been ill, Tom Gaines has not had any accidental exposures to the culprit medication.   Recent/Current History: Pulmonary disease: {Blank single:19197::"yes","no"} Cardiac disease: {Blank single:19197::"yes","no"} Respiratory infection: {Blank single:19197::"yes","no"} Rash: {Blank single:19197::"yes","no"} Itch: {Blank single:19197::"yes","no"} Swelling: {Blank single:19197::"yes","no"} Cough: {Blank single:19197::"yes","no"} Shortness of breath: {Blank single:19197::"yes","no"} Runny/stuffy nose: {Blank single:19197::"yes","no"} Itchy eyes: {Blank single:19197::"yes","no"} Beta-blocker use: {Blank single:19197::"yes","no"}  Tom Gaines/guardian was informed of the test procedure with verbalized understanding of the risk of anaphylaxis. Consent was signed.   Last antihistamine use: *** Last beta-blocker use: ***  Medication List:  Current Outpatient Medications  Medication Sig Dispense Refill  . acetaminophen (TYLENOL) 500 MG tablet Take 500 mg by mouth every 6 (six) hours as needed.    Marland Kitchen allopurinol (ZYLOPRIM) 300 MG tablet TAKE 1 TABLET EVERY DAY 90 tablet 1  . Alpha-Lipoic Acid 600 MG CAPS Take by mouth.    Marland Kitchen amoxicillin (AMOXIL) 250 MG/5ML suspension Do NOT take at home. Bring to Dr. Julianne Rice office for drug challenge. 150 mL 0  . Ascorbic Acid (VITAMIN C) 100 MG tablet Take 100 mg by mouth daily.    . ASPIRIN 81 PO Take by mouth.    Marland Kitchen azelastine (ASTELIN) 0.1 % nasal spray Place 1-2 sprays into both nostrils 2 (two) times daily as needed (nasal drainage). Use in each nostril as directed 30 mL 5  . BREWERS YEAST PO Take by mouth.    . Capsicum, Cayenne, (CAYENNE PO) Take by mouth.    . cetirizine (ZYRTEC) 10 MG tablet Take 10 mg by mouth as needed.     . Cholecalciferol (VITAMIN D) 2000 units CAPS Take 1 capsule by mouth daily.    . Cyanocobalamin (B-12) 2000 MCG TABS Take 2,000 mcg by mouth.    . fenofibrate (TRICOR) 145 MG tablet TAKE 1 TABLET EVERY DAY 90 tablet 2   . FOLIC ACID PO Take 6,270 mcg by mouth.    . Ginger, Zingiber officinalis, 500 MG CAPS Take by mouth.    . INULIN PO Take by mouth. Tom Gaines takes 1/4 teaspoon    . L-CITRULLINE PO Take by mouth.    . LYSINE PO Take 1,000 mg by mouth.    . medium chain triglycerides (MCT OIL) oil Take by mouth 3 (three) times daily.    . Menaquinone-7 (VITAMIN K2 PO) Take 120 mg by mouth daily.    . Multiple Vitamin (MULTIVITAMIN) tablet Take 1 tablet by mouth daily.    . Omega-3 Fatty Acids (FISH OIL) 1000 MG CAPS Take 1,000 mg by mouth. 1 by mouth daily    . RESVERATROL PO Take by mouth. Tom Gaines takes bid    . traMADol (ULTRAM) 50 MG tablet TAKE 1 TABLET (50 MG TOTAL) BY MOUTH DAILY. FOR CHRONIC BACK PAIN 90 tablet 0  . Turmeric Curcumin 500 MG CAPS Take 500 mg by mouth 3 (three) times daily.      No current facility-administered medications for this visit.   Allergies: Allergies  Allergen Reactions  . 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  Review of Systems  Constitutional: Positive for fatigue. Negative for appetite change, chills, fever and unexpected weight change.  HENT: Positive for congestion and rhinorrhea.   Eyes: Negative for itching.  Respiratory: Negative for cough, chest tightness, shortness of breath and wheezing.   Cardiovascular: Negative for chest pain.  Gastrointestinal: Negative for abdominal pain.  Genitourinary: Negative for difficulty urinating.  Skin: Negative for rash.  Allergic/Immunologic: Positive for environmental allergies.  Neurological: Negative for headaches.   Objective: There were no vitals taken for this visit. There is no height or weight on file to calculate BMI. Physical Exam Vitals and nursing note reviewed.  Constitutional:       Appearance: Normal appearance. Tom Gaines is well-developed.  HENT:     Head: Normocephalic and atraumatic.     Right Ear: Tympanic membrane and external ear normal.     Left Ear: Tympanic membrane and external ear normal.     Nose: Nose normal.     Mouth/Throat:     Mouth: Mucous membranes are moist.     Pharynx: Oropharynx is clear.  Eyes:     Conjunctiva/sclera: Conjunctivae normal.  Cardiovascular:     Rate and Rhythm: Normal rate and regular rhythm.     Heart sounds: Normal heart sounds. No murmur heard. No friction rub. No gallop.   Pulmonary:     Effort: Pulmonary effort is normal.     Breath sounds: Normal breath sounds. No wheezing, rhonchi or rales.  Musculoskeletal:     Cervical back: Neck supple.  Skin:    General: Skin is warm.     Findings: No rash.  Neurological:     Mental Status: Tom Gaines is alert and oriented to person, place, and time.  Psychiatric:        Behavior: Behavior normal.     Diagnostics: Spirometry:  Tracings reviewed. His effort: {Blank single:19197::"Good reproducible efforts.","It was hard to get consistent efforts and there is a question as to whether this reflects a maximal maneuver.","Poor effort, data can not be interpreted."} FVC: ***L FEV1: ***L, ***% predicted FEV1/FVC ratio: ***% Interpretation: {Blank single:19197::"Spirometry consistent with mild obstructive disease","Spirometry consistent with moderate obstructive disease","Spirometry consistent with severe obstructive disease","Spirometry consistent with possible restrictive disease","Spirometry consistent with mixed obstructive and restrictive disease","Spirometry uninterpretable due to technique","Spirometry consistent with normal pattern","No overt abnormalities noted given today's efforts"}.  Please see scanned spirometry results for details.  Skin Testing: {Blank single:19197::"None","Deferred due to recent antihistamines use"}. Positive test to: ***. Negative test to: ***.  Results  discussed with Tom Gaines/family.   Previous notes and tests were reviewed. The plan was reviewed with the Tom Gaines/family, and all questions/concerned were addressed.  It was my pleasure to see Tom Gaines today and participate in his care. Please feel free to contact me with any questions or concerns.  Sincerely,  Rexene Alberts, DO Allergy & Immunology  [facility] office: [place] office: [phone]

## 2020-04-21 DIAGNOSIS — M545 Low back pain, unspecified: Secondary | ICD-10-CM | POA: Diagnosis not present

## 2020-04-23 NOTE — Progress Notes (Signed)
Follow Up Note  RE: Tom Gaines MRN: [id number] DOB: 1945/11/05 Date of Office Visit: 04/24/2020  Referring provider: Binnie Rail, MD Primary care provider: Binnie Rail, MD  Chief Complaint: Food/Drug Challenge (penicillin)  Assessment and Plan: Tom Gaines is a 75 y.o. male with: History of penicillin allergy Past history - Patient was given penicillin for some kind of skin infection which led to worsening rash/hives.  Not sure if patient had penicillin prior to this event.  Reaction was treated as outpatient.  Consider penicillin skin testing/drug challenge in the future. Interim history   Negative skin prick and intradermal testing today to penicillin G and pre pen.  Patient tolerated 550mg  of amoxicillin today with no reactions.   I removed penicillin from allergy list.  You may take penicillin type of antibiotics in the future.  Your risk of developing penicillin allergy in the future is the same as the general population's.  Encounter for screening for diseases of the blood and blood-forming organs and certain disorders involving the immune mechanism Past history - History of annual sinus infections and pneumonia in 2021.  Denies any other significant infections.  History of prostate cancer in the 1990s and has stage III kidney disease which is followed by nephrology and is pre-diabetic. Interim history - normal immunoglobulin levels, low titers again s. Pneumoniae. Patient got pneumovax.  Keep track of infections and antibiotics use.  Get s pneumonia titers to look at vaccine response.  Other allergic rhinitis Past history - Perennial rhinitis symptoms for 10+ years.  Tried Flonase and Zyrtec with unknown benefit.  Usually gets one sinus infection per year.  2022 skin testing showed: Positive to mold, dog, and dust mites.  Continue environmental control measures as below.  May use over the counter antihistamines such as Zyrtec (cetirizine), Claritin  (loratadine), Allegra (fexofenadine), or Xyzal (levocetirizine) daily as needed.  May use azelastine nasal spray 1-2 sprays per nostril twice a day as needed for runny nose/drainage.  Return if symptoms worsen or fail to improve.  Plan: Challenge drug: amoxicillin Challenge as per protocol: Passed Total time: 75 min for oral challenge part.  History of Present Illness: I had the pleasure of seeing Tom Gaines for a follow up visit at the [facility] on 04/24/2020. He is a 75 y.o. male, who is being followed for allergic rhinitis, penicillin allergy. His previous allergy office visit was on 03/02/2020 with Dr. Maudie Mercury. Today he is here for penicillin drug testing and challenge.   History of Reaction: Patient was given penicillin for some kind of skin infection which led to worsening rash/hives in the 1960s.  Not sure if patient had penicillin prior to this event.  Reaction was treated as outpatient. No penicillin since then.   Interval History: Patient has not been ill, he has not had any accidental exposures to the culprit medication.   Recent/Current History: Pulmonary disease: no Cardiac disease: no Respiratory infection: no Rash: no Itch: itchy foot. Swelling: no Cough: no Shortness of breath: no Runny/stuffy nose: some rhinorrhea since off antihistamines.  Itchy eyes: no Beta-blocker use: no  Patient/guardian was informed of the test procedure with verbalized understanding of the risk of anaphylaxis. Consent was signed.   Last antihistamine use: none in the past 3 days Last beta-blocker use: n/a  Medication List:  Current Outpatient Medications  Medication Sig Dispense Refill  . acetaminophen (TYLENOL) 500 MG tablet Take 500 mg by mouth every 6 (six) hours as needed.    Marland Kitchen  allopurinol (ZYLOPRIM) 300 MG tablet TAKE 1 TABLET EVERY DAY 90 tablet 1  . Alpha-Lipoic Acid 600 MG CAPS Take by mouth.    Marland Kitchen amoxicillin (AMOXIL) 250 MG/5ML suspension Do NOT take at  home. Bring to Dr. [facility] office for drug challenge. 150 mL 0  . Ascorbic Acid (VITAMIN C) 100 MG tablet Take 100 mg by mouth daily.    . ASPIRIN 81 PO Take by mouth.    Marland Kitchen azelastine (ASTELIN) 0.1 % nasal spray Place 1-2 sprays into both nostrils 2 (two) times daily as needed (nasal drainage). Use in each nostril as directed 30 mL 5  . BREWERS YEAST PO Take by mouth.    . Capsicum, Cayenne, (CAYENNE PO) Take by mouth.    . cetirizine (ZYRTEC) 10 MG tablet Take 10 mg by mouth as needed.     . Cholecalciferol (VITAMIN D) 2000 units CAPS Take 1 capsule by mouth daily.    . Cyanocobalamin (B-12) 2000 MCG TABS Take 2,000 mcg by mouth.    . fenofibrate (TRICOR) 145 MG tablet TAKE 1 TABLET EVERY DAY 90 tablet 2  . FOLIC ACID PO Take 2,637 mcg by mouth.    . Ginger, Zingiber officinalis, 500 MG CAPS Take by mouth.    . INULIN PO Take by mouth. Patient takes 1/4 teaspoon    . L-CITRULLINE PO Take by mouth.    . LYSINE PO Take 1,000 mg by mouth.    . medium chain triglycerides (MCT OIL) oil Take by mouth 3 (three) times daily.    . Menaquinone-7 (VITAMIN K2 PO) Take 120 mg by mouth daily.    . Multiple Vitamin (MULTIVITAMIN) tablet Take 1 tablet by mouth daily.    . Omega-3 Fatty Acids (FISH OIL) 1000 MG CAPS Take 1,000 mg by mouth. 1 by mouth daily    . RESVERATROL PO Take by mouth. Patient takes bid    . traMADol (ULTRAM) 50 MG tablet TAKE 1 TABLET (50 MG TOTAL) BY MOUTH DAILY. FOR CHRONIC BACK PAIN 90 tablet 0  . Turmeric Curcumin 500 MG CAPS Take 500 mg by mouth 3 (three) times daily.      No current facility-administered medications for this visit.   Allergies: No Known Allergies I reviewed his past medical history, social history, family history, and environmental history and no significant changes have been reported from his previous visit.  Review of Systems  Constitutional: Negative for appetite change, chills, fever and unexpected weight change.  HENT: Positive for rhinorrhea. Negative  for congestion.   Eyes: Negative for itching.  Respiratory: Negative for cough, chest tightness, shortness of breath and wheezing.   Cardiovascular: Negative for chest pain.  Gastrointestinal: Negative for abdominal pain.  Genitourinary: Negative for difficulty urinating.  Skin: Negative for rash.  Allergic/Immunologic: Positive for environmental allergies.  Neurological: Negative for headaches.   Objective: BP 118/78   Pulse 78   Resp 16   SpO2 95%  There is no height or weight on file to calculate BMI. Physical Exam Vitals and nursing note reviewed.  Constitutional:      Appearance: Normal appearance. He is well-developed.  HENT:     Head: Normocephalic and atraumatic.     Right Ear: Tympanic membrane and external ear normal.     Left Ear: Tympanic membrane and external ear normal.     Nose: Nose normal.     Mouth/Throat:     Mouth: Mucous membranes are moist.     Pharynx: Oropharynx is clear.  Eyes:  Conjunctiva/sclera: Conjunctivae normal.  Cardiovascular:     Rate and Rhythm: Normal rate and regular rhythm.     Heart sounds: Normal heart sounds. No murmur heard. No friction rub. No gallop.   Pulmonary:     Effort: Pulmonary effort is normal.     Breath sounds: Normal breath sounds. No wheezing, rhonchi or rales.  Musculoskeletal:     Cervical back: Neck supple.  Skin:    General: Skin is warm.     Findings: No rash.  Neurological:     Mental Status: He is alert and oriented to person, place, and time.  Psychiatric:        Behavior: Behavior normal.     Diagnostics: Skin Testing: Negative test to: penicillin and prepen as below.  Results discussed with patient/family.  Oral Challenge - 04/24/20 1000    Challenge Food/Drug Penicillin/Amoxicillin 250mg /50 mL    Lot #  if Applicable [id number]    Food/Drug provided by Patient    Comments EXP: 07/2021    BP 118/78    Pulse 78    Respirations 16    Lungs 95%    Skin Clear    Mouth Clear    Time 1030     Dose 1 mL    Time 1054    Dose 10 mL    BP 122/70    Pulse 60    Respirations 16    Lungs 98%    Skin Clear    Mouth Clear          Penicillin - 04/24/20 0934    Manufacturer N/A    Lot # N/A    Location Arm    Number of allergen test 9    Time Testing Placed 0919   Left Arm   Control SPT Negative    Histamine SPT 2+    Pre-Pen Puncture Negative    Penicillin-G 5000 u/ml SPT Negative    Time Testing Placed 0943   Right Arm   Control Intradermal Negative    Pre-Pen Intradermal Negative    Penicillin-G 50 u/ml Intradermal Negative    Time Testing Placed 1005   Right Arm   Penicillin-G 5000 u/ml Intradermal Negative    Comments --           Previous notes and tests were reviewed. The plan was reviewed with the patient/family, and all questions/concerned were addressed.  It was my pleasure to see Tom Gaines today and participate in his care. Please feel free to contact me with any questions or concerns.  Sincerely,  Rexene Alberts, DO Allergy & Immunology  [facility] office: [place] office: [phone]

## 2020-04-24 ENCOUNTER — Ambulatory Visit (INDEPENDENT_AMBULATORY_CARE_PROVIDER_SITE_OTHER): Payer: Medicare Other | Admitting: Allergy

## 2020-04-24 ENCOUNTER — Other Ambulatory Visit: Payer: Self-pay

## 2020-04-24 ENCOUNTER — Encounter: Payer: Self-pay | Admitting: Allergy

## 2020-04-24 VITALS — BP 118/78 | HR 78 | Resp 16

## 2020-04-24 DIAGNOSIS — Z130 Encounter for screening for diseases of the blood and blood-forming organs and certain disorders involving the immune mechanism: Secondary | ICD-10-CM | POA: Diagnosis not present

## 2020-04-24 DIAGNOSIS — T50905A Adverse effect of unspecified drugs, medicaments and biological substances, initial encounter: Secondary | ICD-10-CM

## 2020-04-24 DIAGNOSIS — J3089 Other allergic rhinitis: Secondary | ICD-10-CM

## 2020-04-24 DIAGNOSIS — Z880 Allergy status to penicillin: Secondary | ICD-10-CM

## 2020-04-24 DIAGNOSIS — T50905D Adverse effect of unspecified drugs, medicaments and biological substances, subsequent encounter: Secondary | ICD-10-CM

## 2020-04-24 NOTE — Assessment & Plan Note (Signed)
Past history - Patient was given penicillin for some kind of skin infection which led to worsening rash/hives.  Not sure if patient had penicillin prior to this event.  Reaction was treated as outpatient.  Consider penicillin skin testing/drug challenge in the future. Interim history   Negative skin prick and intradermal testing today to penicillin G and pre pen.  Patient tolerated 550mg  of amoxicillin today with no reactions.   I removed penicillin from allergy list.  You may take penicillin type of antibiotics in the future.  Your risk of developing penicillin allergy in the future is the same as the general population's.

## 2020-04-24 NOTE — Assessment & Plan Note (Signed)
Past history - Perennial rhinitis symptoms for 10+ years.  Tried Flonase and Zyrtec with unknown benefit.  Usually gets one sinus infection per year.  2022 skin testing showed: Positive to mold, dog, and dust mites.  Continue environmental control measures as below.  May use over the counter antihistamines such as Zyrtec (cetirizine), Claritin (loratadine), Allegra (fexofenadine), or Xyzal (levocetirizine) daily as needed.  May use azelastine nasal spray 1-2 sprays per nostril twice a day as needed for runny nose/drainage.

## 2020-04-24 NOTE — Assessment & Plan Note (Addendum)
Past history - History of annual sinus infections and pneumonia in 2021.  Denies any other significant infections.  History of prostate cancer in the 1990s and has stage III kidney disease which is followed by nephrology and is pre-diabetic. Interim history - normal immunoglobulin levels, low titers again s. Pneumoniae. Patient got pneumovax.  Keep track of infections and antibiotics use.  Get s pneumonia titers to look at vaccine response.

## 2020-04-24 NOTE — Patient Instructions (Addendum)
For next 24 hours monitor for hives, swelling, shortness of breath and dizziness. If you see these symptoms, use Benadryl for mild symptoms and epinephrine for more severe symptoms and call 911.  I will take off penicillin drug from allergy list. You may take penicillin type of antibiotics in the future.  Your risk of developing penicillin allergy in the future is the same as the general population's.  Other allergic rhinitis  Past skin testing showed: Positive to mold, dog, and dust mites.  Continue environmental control measures as below.  May use over the counter antihistamines such as Zyrtec (cetirizine), Claritin (loratadine), Allegra (fexofenadine), or Xyzal (levocetirizine) daily as needed.  May use azelastine nasal spray 1-2 sprays per nostril twice a day as needed for runny nose/drainage.  Frequent infections:  Keep track of infections and antibiotics use.  We are getting bloodwork today to see if you responded well to the pneumonia vaccine. Get bloodwork:  We are ordering labs, so please allow 1-2 weeks for the results to come back. With the newly implemented Cures Act, the labs might be visible to you at the same time that they become visible to me. However, I will not address the results until all of the results are back, so please be patient.   Follow up as needed.

## 2020-05-01 ENCOUNTER — Encounter: Payer: Self-pay | Admitting: Internal Medicine

## 2020-05-14 DIAGNOSIS — M545 Low back pain, unspecified: Secondary | ICD-10-CM | POA: Diagnosis not present

## 2020-05-20 LAB — STREP PNEUMONIAE 23 SEROTYPES IGG
Pneumo Ab Type 1*: 1.3 ug/mL — ABNORMAL LOW (ref >1.3)
Pneumo Ab Type 12 (12F)*: 0.5 ug/mL — ABNORMAL LOW (ref >1.3)
Pneumo Ab Type 14*: >30.6 ug/mL (ref >1.3)
Pneumo Ab Type 17 (17F)*: 0.7 ug/mL — ABNORMAL LOW (ref >1.3)
Pneumo Ab Type 19 (19F)*: 8.7 ug/mL (ref >1.3)
Pneumo Ab Type 2*: 2.7 ug/mL (ref >1.3)
Pneumo Ab Type 20*: 3.9 ug/mL (ref >1.3)
Pneumo Ab Type 22 (22F)*: 0.7 ug/mL — ABNORMAL LOW (ref >1.3)
Pneumo Ab Type 23 (23F)*: 0.9 ug/mL — ABNORMAL LOW (ref >1.3)
Pneumo Ab Type 26 (6B)*: 1.3 ug/mL — ABNORMAL LOW (ref >1.3)
Pneumo Ab Type 3*: 7.2 ug/mL (ref >1.3)
Pneumo Ab Type 34 (10A)*: 3.2 ug/mL (ref >1.3)
Pneumo Ab Type 4*: 2.9 ug/mL (ref >1.3)
Pneumo Ab Type 43 (11A)*: 1 ug/mL — ABNORMAL LOW (ref >1.3)
Pneumo Ab Type 5*: 0.9 ug/mL — ABNORMAL LOW (ref >1.3)
Pneumo Ab Type 51 (7F)*: 1.6 ug/mL (ref >1.3)
Pneumo Ab Type 54 (15B)*: 4.2 ug/mL (ref >1.3)
Pneumo Ab Type 56 (18C)*: 1.3 ug/mL — ABNORMAL LOW (ref >1.3)
Pneumo Ab Type 57 (19A)*: 2.9 ug/mL (ref >1.3)
Pneumo Ab Type 68 (9V)*: 3.1 ug/mL (ref >1.3)
Pneumo Ab Type 70 (33F)*: 2.4 ug/mL (ref >1.3)
Pneumo Ab Type 8*: 7 ug/mL (ref >1.3)
Pneumo Ab Type 9 (9N)*: 2.2 ug/mL (ref >1.3)

## 2020-06-10 ENCOUNTER — Other Ambulatory Visit: Payer: Self-pay | Admitting: Internal Medicine

## 2020-06-18 DIAGNOSIS — Z23 Encounter for immunization: Secondary | ICD-10-CM | POA: Diagnosis not present

## 2020-06-18 DIAGNOSIS — M545 Low back pain, unspecified: Secondary | ICD-10-CM | POA: Diagnosis not present

## 2020-06-22 NOTE — Progress Notes (Signed)
Virtual Visit via Video Note  I connected with Tom Gaines on 06/22/20 at  8:30 AM EDT by a video enabled telemedicine application and verified that I am speaking with the correct person using two identifiers.   I discussed the limitations of evaluation and management by telemedicine and the availability of in person appointments. The patient expressed understanding and agreed to proceed.  Present for the visit:  Myself, Dr Billey Gosling, [place] Score.  The patient is currently at home and I am in the office.    No referring provider.    History of Present Illness: He is here for an acute visit for cold symptoms.  His symptoms started 3 days ago  He is experiencing achiness, chills, fatigue, mild ST, ETD, some nasal congestion - more in throat, sore throat and headaches.   He feels better today.  He has tried taking tylenol.   Last Wednesday - got 4th covid vaccine.     Review of Systems  Constitutional: Positive for chills and malaise/fatigue. Negative for fever.  HENT: Positive for congestion and sore throat. Negative for ear pain (ear pressure) and sinus pain.   Respiratory: Positive for cough. Negative for sputum production, shortness of breath and wheezing.   Musculoskeletal: Positive for joint pain and myalgias.  Neurological: Positive for headaches.      Social History   Socioeconomic History  . Marital status: Married    Spouse name: Not on file  . Number of children: Not on file  . Years of education: Not on file  . Highest education level: Not on file  Occupational History  . Occupation: retired  Tobacco Use  . Smoking status: Former Smoker    Quit date: 01/31/1985    Years since quitting: 35.4  . Smokeless tobacco: Never Used  . Tobacco comment: smoked  [place], up to 2.5 ppd  Vaping Use  . Vaping Use: Never used  Substance and Sexual Activity  . Alcohol use: No    Comment:  none since 2012  . Drug use: No  . Sexual activity: Yes  Other Topics  Concern  . Not on file  Social History Narrative   Exercises regularly - gym 5 days a week   Social Determinants of Health   Financial Resource Strain: Low Risk   . Difficulty of Paying Living Expenses: Not hard at all  Food Insecurity: No Food Insecurity  . Worried About Charity fundraiser in the Last Year: Never true  . Ran Out of Food in the Last Year: Never true  Transportation Needs: No Transportation Needs  . Lack of Transportation (Medical): No  . Lack of Transportation (Non-Medical): No  Physical Activity: Sufficiently Active  . Days of Exercise per Week: 5 days  . Minutes of Exercise per Session: 30 min  Stress: No Stress Concern Present  . Feeling of Stress : Not at all  Social Connections: Socially Integrated  . Frequency of Communication with Friends and Family: More than three times a week  . Frequency of Social Gatherings with Friends and Family: More than three times a week  . Attends Religious Services: More than 4 times per year  . Active Member of Clubs or Organizations: Yes  . Attends Archivist Meetings: More than 4 times per year  . Marital Status: Married     Observations/Objective: Appears well in NAD Breathing normally Skin appears warm and dry  Assessment and Plan:  See Problem List for Assessment and Plan  of chronic medical problems.   Follow Up Instructions:    I discussed the assessment and treatment plan with the patient. The patient was provided an opportunity to ask questions and all were answered. The patient agreed with the plan and demonstrated an understanding of the instructions.   The patient was advised to call back or seek an in-person evaluation if the symptoms worsen or if the condition fails to improve as anticipated.    Binnie Rail, MD

## 2020-06-23 ENCOUNTER — Telehealth: Payer: Self-pay | Admitting: Internal Medicine

## 2020-06-23 ENCOUNTER — Telehealth (INDEPENDENT_AMBULATORY_CARE_PROVIDER_SITE_OTHER): Payer: Medicare Other | Admitting: Internal Medicine

## 2020-06-23 ENCOUNTER — Encounter: Payer: Self-pay | Admitting: Internal Medicine

## 2020-06-23 DIAGNOSIS — J069 Acute upper respiratory infection, unspecified: Secondary | ICD-10-CM | POA: Diagnosis not present

## 2020-06-23 DIAGNOSIS — J019 Acute sinusitis, unspecified: Secondary | ICD-10-CM | POA: Insufficient documentation

## 2020-06-23 DIAGNOSIS — Z20822 Contact with and (suspected) exposure to covid-19: Secondary | ICD-10-CM | POA: Diagnosis not present

## 2020-06-23 NOTE — Telephone Encounter (Signed)
Patient called and said that he went to the Covid testing site and they told him that they did not have an order for him to get tested. He was wondering if the order could be placed. Please advise

## 2020-06-23 NOTE — Telephone Encounter (Signed)
Patient called and said that he tested positive for covid and was wondering if there are any treatments. He had some questions about the infusion. Please advise

## 2020-06-23 NOTE — Assessment & Plan Note (Signed)
Acute Symptoms consistent with viral URI, possibly COVID Discussed getting tested for COVID Symptoms started 3 days ago I have not discussed that he is in the window of having antiviral treatment, but symptoms are relatively mild Will see what COVID test comes back as and if the symptoms are still mild he may not necessarily want the antiviral treatment and may not require it Discussed symptomatic treatment with over-the-counter cold medications

## 2020-06-23 NOTE — Telephone Encounter (Signed)
Spoke with patient and he was able to be tested.

## 2020-06-24 DIAGNOSIS — Z20828 Contact with and (suspected) exposure to other viral communicable diseases: Secondary | ICD-10-CM | POA: Diagnosis not present

## 2020-07-02 DIAGNOSIS — D3131 Benign neoplasm of right choroid: Secondary | ICD-10-CM | POA: Diagnosis not present

## 2020-07-02 DIAGNOSIS — H31091 Other chorioretinal scars, right eye: Secondary | ICD-10-CM | POA: Diagnosis not present

## 2020-07-02 DIAGNOSIS — H11132 Conjunctival pigmentations, left eye: Secondary | ICD-10-CM | POA: Diagnosis not present

## 2020-07-02 DIAGNOSIS — H26492 Other secondary cataract, left eye: Secondary | ICD-10-CM | POA: Diagnosis not present

## 2020-07-02 DIAGNOSIS — H04123 Dry eye syndrome of bilateral lacrimal glands: Secondary | ICD-10-CM | POA: Diagnosis not present

## 2020-07-02 DIAGNOSIS — Z961 Presence of intraocular lens: Secondary | ICD-10-CM | POA: Diagnosis not present

## 2020-07-16 DIAGNOSIS — M545 Low back pain, unspecified: Secondary | ICD-10-CM | POA: Diagnosis not present

## 2020-10-01 DIAGNOSIS — C61 Malignant neoplasm of prostate: Secondary | ICD-10-CM | POA: Diagnosis not present

## 2020-10-08 DIAGNOSIS — C61 Malignant neoplasm of prostate: Secondary | ICD-10-CM | POA: Diagnosis not present

## 2020-10-08 DIAGNOSIS — N529 Male erectile dysfunction, unspecified: Secondary | ICD-10-CM | POA: Diagnosis not present

## 2020-10-08 DIAGNOSIS — R972 Elevated prostate specific antigen [PSA]: Secondary | ICD-10-CM | POA: Diagnosis not present

## 2020-10-13 ENCOUNTER — Other Ambulatory Visit (INDEPENDENT_AMBULATORY_CARE_PROVIDER_SITE_OTHER): Payer: Medicare Other

## 2020-10-13 DIAGNOSIS — N1832 Chronic kidney disease, stage 3b: Secondary | ICD-10-CM | POA: Diagnosis not present

## 2020-10-13 DIAGNOSIS — E7211 Homocystinuria: Secondary | ICD-10-CM | POA: Diagnosis not present

## 2020-10-13 DIAGNOSIS — M109 Gout, unspecified: Secondary | ICD-10-CM | POA: Diagnosis not present

## 2020-10-13 DIAGNOSIS — E785 Hyperlipidemia, unspecified: Secondary | ICD-10-CM | POA: Diagnosis not present

## 2020-10-13 DIAGNOSIS — M2550 Pain in unspecified joint: Secondary | ICD-10-CM

## 2020-10-13 DIAGNOSIS — R7303 Prediabetes: Secondary | ICD-10-CM

## 2020-10-13 DIAGNOSIS — I5189 Other ill-defined heart diseases: Secondary | ICD-10-CM | POA: Diagnosis not present

## 2020-10-13 DIAGNOSIS — N2581 Secondary hyperparathyroidism of renal origin: Secondary | ICD-10-CM | POA: Diagnosis not present

## 2020-10-13 DIAGNOSIS — I77810 Thoracic aortic ectasia: Secondary | ICD-10-CM | POA: Diagnosis not present

## 2020-10-13 DIAGNOSIS — Z23 Encounter for immunization: Secondary | ICD-10-CM | POA: Diagnosis not present

## 2020-10-13 DIAGNOSIS — R7983 Abnormal findings of blood amino-acid level: Secondary | ICD-10-CM

## 2020-10-13 DIAGNOSIS — N1831 Chronic kidney disease, stage 3a: Secondary | ICD-10-CM | POA: Diagnosis not present

## 2020-10-13 DIAGNOSIS — E7849 Other hyperlipidemia: Secondary | ICD-10-CM | POA: Diagnosis not present

## 2020-10-13 DIAGNOSIS — R5383 Other fatigue: Secondary | ICD-10-CM | POA: Diagnosis not present

## 2020-10-13 LAB — CBC WITH DIFFERENTIAL/PLATELET
Basophils Absolute: 0 10*3/uL (ref 0.0–0.1)
Basophils Relative: 0.7 % (ref 0.0–3.0)
Eosinophils Absolute: 0.1 10*3/uL (ref 0.0–0.7)
Eosinophils Relative: 1.5 % (ref 0.0–5.0)
HCT: 44.1 % (ref 39.0–52.0)
Hemoglobin: 14.4 g/dL (ref 13.0–17.0)
Lymphocytes Relative: 22.4 % (ref 12.0–46.0)
Lymphs Abs: 1.4 10*3/uL (ref 0.7–4.0)
MCHC: 32.7 g/dL (ref 30.0–36.0)
MCV: 96.2 fl (ref 78.0–100.0)
Monocytes Absolute: 0.6 10*3/uL (ref 0.1–1.0)
Monocytes Relative: 10.3 % (ref 3.0–12.0)
Neutro Abs: 4 10*3/uL (ref 1.4–7.7)
Neutrophils Relative %: 65.1 % (ref 43.0–77.0)
Platelets: 201 10*3/uL (ref 150.0–400.0)
RBC: 4.58 Mil/uL (ref 4.22–5.81)
RDW: 15.6 % — ABNORMAL HIGH (ref 11.5–15.5)
WBC: 6.2 10*3/uL (ref 4.0–10.5)

## 2020-10-13 LAB — C-REACTIVE PROTEIN: CRP: <1 mg/dL (ref 0.5–20.0)

## 2020-10-13 LAB — BASIC METABOLIC PANEL
BUN: 31 mg/dL — ABNORMAL HIGH (ref 6–23)
CO2: 30 mEq/L (ref 19–32)
Calcium: 10.3 mg/dL (ref 8.4–10.5)
Chloride: 105 mEq/L (ref 96–112)
Creatinine, Ser: 1.46 mg/dL (ref 0.40–1.50)
GFR: 46.77 mL/min — ABNORMAL LOW (ref >60.00)
Glucose, Bld: 88 mg/dL (ref 70–99)
Potassium: 4.6 mEq/L (ref 3.5–5.1)
Sodium: 142 mEq/L (ref 135–145)

## 2020-10-13 LAB — LIPID PANEL
Cholesterol: 164 mg/dL (ref 0–200)
HDL: 61.1 mg/dL (ref >39.00)
LDL Cholesterol: 83 mg/dL (ref 0–99)
NonHDL: 103.25
Total CHOL/HDL Ratio: 3
Triglycerides: 99 mg/dL (ref 0.0–149.0)
VLDL: 19.8 mg/dL (ref 0.0–40.0)

## 2020-10-13 LAB — HEMOGLOBIN A1C: Hgb A1c MFr Bld: 5.8 % (ref 4.6–6.5)

## 2020-10-13 LAB — SEDIMENTATION RATE: Sed Rate: 6 mm/hr (ref 0–20)

## 2020-10-15 LAB — RHEUMATOID FACTOR: Rheumatoid fact SerPl-aCnc: 25 IU/mL — ABNORMAL HIGH (ref <14)

## 2020-10-15 LAB — ANA: Anti Nuclear Antibody (ANA): NEGATIVE

## 2020-10-15 LAB — HOMOCYSTEINE: Homocysteine: 15.6 umol/L — ABNORMAL HIGH (ref <11.4)

## 2020-10-15 NOTE — Patient Instructions (Addendum)
  Blood work was ordered.     Medications changes include :   none   A referral was ordered for  ENT and rheumatology.      Someone from their office will call you to schedule an appointment.    Please followup in 6 months

## 2020-10-15 NOTE — Progress Notes (Signed)
Subjective:    Patient ID: Tom Gaines, male    DOB: December 14, 1945, 75 y.o.   MRN: [id number]  This visit occurred during the SARS-CoV-2 public health emergency.  Safety protocols were in place, including screening questions prior to the visit, additional usage of staff PPE, and extensive cleaning of exam room while observing appropriate contact time as indicated for disinfecting solutions.     HPI The patient is here for follow up of their chronic medical problems, including prediabetes, hichol, ckd, gout, homocystinemia, lower back pain, b/l shoulder pain   He is taking a multivitamin with multiple B vitamins in it  B6 2, folic [id number], [id number] 15   He has had decreased smell for a while- not covid related.  He wonders if it is worth having this evaluated or not  Last tramadol several weeks ago    Medications and allergies reviewed with patient and updated if appropriate.  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   Nasal sore 08/27/2018   Bilateral shoulder pain 10/03/2017   CKD (chronic kidney disease) 03/31/2017   Bilateral shoulder bursitis 02/01/2017   Prediabetes 03/29/2016   Nonallopathic lesion of lumbosacral region 12/01/2015   Nonallopathic lesion of sacral region 12/01/2015   Nonallopathic lesion of thoracic region 12/01/2015   Low back pain 03/13/2014   Basal cell carcinoma 02/13/2013   Hyperlipidemia 03/12/2008   History of gout 03/12/2008   PROSTATE CANCER, HX OF 03/12/2008   History of colonic polyps 03/12/2008    Current Outpatient Medications on File Prior to Visit   Medication Sig Dispense Refill   acetaminophen (TYLENOL) 500 MG tablet Take 500 mg by mouth every 6 (six) hours as needed.     acyclovir (ZOVIRAX) 800 MG tablet Take 400 mg by mouth 2 (two) times daily.     allopurinol (ZYLOPRIM) 300 MG tablet TAKE 1 TABLET EVERY DAY 90 tablet 1   Alpha-Lipoic Acid 600 MG CAPS Take by mouth.     Ascorbic Acid (VITAMIN C) 100 MG tablet Take 100 mg by mouth daily.     ASHWAGANDHA PO Take 300 mg by mouth 2 (two) times daily.     BREWERS YEAST PO Take by mouth.     cetirizine (ZYRTEC) 10 MG tablet Take 10 mg by mouth as needed.      Cholecalciferol (VITAMIN D) 2000 units CAPS Take 1 capsule by mouth daily.     fenofibrate (TRICOR) 145 MG tablet TAKE 1 TABLET EVERY DAY 90 tablet 2   L-CITRULLINE PO Take by mouth.     LYSINE PO Take 1,000 mg by mouth.     medium chain triglycerides (MCT OIL) oil Take by mouth 3 (three) times daily.     Menaquinone-7 (VITAMIN K2 PO) Take 120 mg by mouth daily.     Multiple Vitamin (MULTIVITAMIN) tablet Take 1 tablet by mouth daily.     Omega-3 Fatty Acids (FISH OIL) 1000 MG CAPS Take 1,000 mg by mouth. 1 by mouth daily     Quercetin 500 MG CAPS Take by mouth.     RESVERATROL PO Take by mouth. Patient takes bid  Turmeric Curcumin 500 MG CAPS Take 500 mg by mouth 3 (three) times daily.      UBIQUINOL PO Take 100 mg by mouth.     Capsicum, Cayenne, (CAYENNE PO) Take by mouth. (Patient not taking: Reported on 10/16/2020)     traMADol (ULTRAM) 50 MG tablet TAKE 1 TABLET (50 MG TOTAL) BY MOUTH DAILY. FOR CHRONIC BACK PAIN (Patient not taking: Reported on 10/16/2020) 90 tablet 0   No current facility-administered medications on file prior to visit.    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 LDL goal equal less than 160, ideally less than 130. No FH MI or CVA   Low back pain 03/13/2014   Chronic, has seen Dr Tamala Julian and Dr Lynann Bologna Taking tramadol as needed   Nonallopathic lesion of lumbosacral region 12/01/2015   Nonallopathic lesion of sacral region 12/01/2015   Nonallopathic lesion of thoracic region 12/01/2015   Prediabetes 03/29/2016   Mom, sister DM   PROSTATE CANCER, HX OF 03/12/2008   Qualifier: Diagnosis of  By: Linna Darner MD, [facility]   Radical prostatectomy 1998, Urologist Dr. Lawerance Bach     Past Surgical History:  Procedure Laterality Date   CATARACT EXTRACTION, BILATERAL  2017   colonoscopy with polypectomy  2014   Dr Sydnee Cabal [facility] SURGERY  2002   Dr Ellene Route   PROSTATECTOMY  1998   SHOULDER ARTHROSCOPY     right; 2011; left 1990   TONSILLECTOMY AND ADENOIDECTOMY      Social History   Socioeconomic History   Marital status: Married    Spouse name: Not on file   Number of children: Not on file   Years of education: Not on file   Highest education level: Not on file  Occupational History   Occupation: retired  Tobacco Use   Smoking status: Former    Types: Cigarettes    Quit date: 01/31/1985    Years since quitting: 35.7   Smokeless tobacco: Never   Tobacco comments:    smoked  [place], up to 2.5 ppd  Vaping Use   Vaping Use: Never used  Substance and Sexual Activity   Alcohol use: No    Comment:  none since 2012   Drug use: No   Sexual activity: Yes  Other  Topics Concern   Not on file  Social History Narrative   Exercises regularly - gym 5 days a week   Social Determinants of Health   Financial Resource Strain: Low Risk    Difficulty of Paying Living Expenses: Not hard at all  Food Insecurity: No Food Insecurity   Worried About Charity fundraiser in the  Last Year: Never true   Ran Out of Food in the Last Year: Never true  Transportation Needs: No Transportation Needs   Lack of Transportation (Medical): No   Lack of Transportation (Non-Medical): No  Physical Activity: Sufficiently Active   Days of Exercise per Week: 5 days   Minutes of Exercise per Session: 30 min  Stress: No Stress Concern Present   Feeling of Stress : Not at all  Social Connections: Socially Integrated   Frequency of Communication with Friends and Family: More than three times a week   Frequency of Social Gatherings with Friends and Family: More than three times a week   Attends Religious Services: More than 4 times per year   Active Member of [employer] or Organizations: Yes   Attends Music therapist: More than 4 times per year   Marital Status: Married    Family History  Problem Relation Age of Onset   Diabetes Mother    Dementia Mother    Diabetes Sister    Lung cancer Other        maternal uncle & aunt   Colon cancer Neg Hx    Stomach cancer Neg Hx    Arthritis Neg Hx    Heart disease Neg Hx    Stroke Neg Hx    Esophageal cancer Neg Hx    Liver cancer Neg Hx    Pancreatic cancer Neg Hx    Rectal cancer Neg Hx     Review of Systems  Constitutional:  Negative for fever.  Respiratory:  Positive for cough (PND related). Negative for shortness of breath and wheezing.   Cardiovascular:  Negative for chest pain, palpitations and leg swelling.  Musculoskeletal:  Positive for arthralgias (shoulders a little) and back pain (intermittent).  Neurological:  Negative for light-headedness and headaches.      Objective:   Vitals:   10/16/20 1543   BP: 110/70  Pulse: 82  Temp: 98 F (36.7 C)  SpO2: 98%   BP Readings from Last 3 Encounters:  10/16/20 110/70  04/24/20 118/78  04/20/20 120/76   Wt Readings from Last 3 Encounters:  10/16/20 174 lb 12.8 oz (79.3 kg)  04/20/20 180 lb 9.6 oz (81.9 kg)  04/14/20 179 lb (81.2 kg)   Body mass index is 24.38 kg/m.   Physical Exam    Constitutional: Appears well-developed and well-nourished. No distress.  HENT:  Head: Normocephalic and atraumatic.  Neck: Neck supple. No tracheal deviation present. No thyromegaly present.  No cervical lymphadenopathy Cardiovascular: Normal rate, regular rhythm and normal heart sounds.   No murmur heard. No carotid bruit .  No edema Pulmonary/Chest: Effort normal and breath sounds normal. No respiratory distress. No has no wheezes. No rales.  Skin: Skin is warm and dry. Not diaphoretic.  Psychiatric: Normal mood and affect. Behavior is normal.   Lab Results  Component Value Date   WBC 6.2 10/13/2020   HGB 14.4 10/13/2020   HCT 44.1 10/13/2020   PLT 201.0 10/13/2020   GLUCOSE 88 10/13/2020   CHOL 164 10/13/2020   TRIG 99.0 10/13/2020   HDL 61.10 10/13/2020   LDLDIRECT 83.0 10/09/2019   LDLCALC 83 10/13/2020   ALT 15 04/13/2020   AST 25 04/13/2020   NA 142 10/13/2020   K 4.6 10/13/2020   CL 105 10/13/2020   CREATININE 1.46 10/13/2020   BUN 31 (H) 10/13/2020   CO2 30 10/13/2020   TSH 0.85 04/04/2018   PSA 0.60 03/01/2010   HGBA1C 5.8 10/13/2020  Assessment & Plan:    See Problem List for Assessment and Plan of chronic medical problems.

## 2020-10-16 ENCOUNTER — Other Ambulatory Visit: Payer: Self-pay

## 2020-10-16 ENCOUNTER — Ambulatory Visit (INDEPENDENT_AMBULATORY_CARE_PROVIDER_SITE_OTHER): Payer: Medicare Other | Admitting: Internal Medicine

## 2020-10-16 ENCOUNTER — Encounter: Payer: Self-pay | Admitting: Internal Medicine

## 2020-10-16 VITALS — BP 110/70 | HR 82 | Temp 98.0°F | Ht 71.0 in | Wt 174.8 lb

## 2020-10-16 DIAGNOSIS — N1832 Chronic kidney disease, stage 3b: Secondary | ICD-10-CM | POA: Diagnosis not present

## 2020-10-16 DIAGNOSIS — M545 Low back pain, unspecified: Secondary | ICD-10-CM | POA: Diagnosis not present

## 2020-10-16 DIAGNOSIS — Z8739 Personal history of other diseases of the musculoskeletal system and connective tissue: Secondary | ICD-10-CM

## 2020-10-16 DIAGNOSIS — E7211 Homocystinuria: Secondary | ICD-10-CM

## 2020-10-16 DIAGNOSIS — R768 Other specified abnormal immunological findings in serum: Secondary | ICD-10-CM | POA: Diagnosis not present

## 2020-10-16 DIAGNOSIS — E7849 Other hyperlipidemia: Secondary | ICD-10-CM

## 2020-10-16 DIAGNOSIS — G8929 Other chronic pain: Secondary | ICD-10-CM

## 2020-10-16 DIAGNOSIS — R7303 Prediabetes: Secondary | ICD-10-CM | POA: Diagnosis not present

## 2020-10-16 DIAGNOSIS — R438 Other disturbances of smell and taste: Secondary | ICD-10-CM | POA: Diagnosis not present

## 2020-10-16 NOTE — Assessment & Plan Note (Signed)
Chronic No episodes of gout Continue allopurinol 300 mg daily

## 2020-10-16 NOTE — Assessment & Plan Note (Signed)
Chronic Lipids well controlled Continue regular exercise, healthy diet Continue fenofibrate 145 milligrams daily

## 2020-10-16 NOTE — Assessment & Plan Note (Signed)
Positive rheumatoid factor No significant joint pain so may not need any treatment, but may need some further evaluation Will refer to rheumatology

## 2020-10-16 NOTE — Assessment & Plan Note (Signed)
Chronic Creatinine improved and GFR improved Continue to monitor He is following with nephrology

## 2020-10-16 NOTE — Assessment & Plan Note (Signed)
Chronic Homocystine level elevated Continue folate, B12 and B6 supplementation Advised increasing B6 intake Will recheck levels at next visit

## 2020-10-16 NOTE — Assessment & Plan Note (Addendum)
Chronic Lab Results  Component Value Date   HGBA1C 5.8 10/13/2020    Low sugar / carb diet Stressed regular exercise

## 2020-10-16 NOTE — Assessment & Plan Note (Signed)
Chronic Currently doing regular exercise Pain has been well controlled and has not taken the tramadol in several weeks Okay to take tramadol if needed Continue regular exercise

## 2020-10-16 NOTE — Assessment & Plan Note (Signed)
Subacute Has had this for a while-not related to COVID He is unable to connect this to anything in particular such as a sinus infection or cold Will refer to ENT

## 2020-11-11 NOTE — Progress Notes (Signed)
HPI: FU PVCs.  ETT November 2011 showed no ST changes.  Monitor August 2020 showed sinus bradycardia to sinus tachycardia, frequent PVCs and 6 beats of nonsustained ventricular tachycardia.  Echocardiogram September 2020 showed ejection fraction 55 to 70%, grade 1 diastolic dysfunction and mildly dilated ascending aorta.  Abdominal ultrasound September 2020 showed no aneurysm.  Since last seen patient denies dyspnea, exertional chest pain, palpitations or syncope.  No claudication.  Current Outpatient Medications  Medication Sig Dispense Refill   acetaminophen (TYLENOL) 500 MG tablet Take 500 mg by mouth every 6 (six) hours as needed.     acyclovir (ZOVIRAX) 800 MG tablet Take 400 mg by mouth 2 (two) times daily.     allopurinol (ZYLOPRIM) 300 MG tablet TAKE 1 TABLET EVERY DAY 90 tablet 1   Alpha-Lipoic Acid 600 MG CAPS Take by mouth.     Ascorbic Acid (VITAMIN C) 100 MG tablet Take 100 mg by mouth daily.     ASHWAGANDHA PO Take 300 mg by mouth 2 (two) times daily.     BREWERS YEAST PO Take by mouth.     Capsicum, Cayenne, (CAYENNE PO) Take by mouth.     cetirizine (ZYRTEC) 10 MG tablet Take 10 mg by mouth as needed.      Cholecalciferol (VITAMIN D) 2000 units CAPS Take 1 capsule by mouth daily.     fenofibrate (TRICOR) 145 MG tablet TAKE 1 TABLET EVERY DAY 90 tablet 2   L-CITRULLINE PO Take by mouth.     LYSINE PO Take 1,000 mg by mouth.     medium chain triglycerides (MCT OIL) oil Take by mouth 3 (three) times daily.     Menaquinone-7 (VITAMIN K2 PO) Take 120 mg by mouth daily.     Multiple Vitamin (MULTIVITAMIN) tablet Take 1 tablet by mouth daily.     Omega-3 Fatty Acids (FISH OIL) 1000 MG CAPS Take 1,000 mg by mouth. 1 by mouth daily     Quercetin 500 MG CAPS Take by mouth.     RESVERATROL PO Take by mouth. Patient takes bid     traMADol (ULTRAM) 50 MG tablet TAKE 1 TABLET (50 MG TOTAL) BY MOUTH DAILY. FOR CHRONIC BACK PAIN 90 tablet 0   Turmeric Curcumin 500 MG CAPS Take 500  mg by mouth 3 (three) times daily.      UBIQUINOL PO Take 100 mg by mouth.     No current facility-administered medications for this visit.     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 LDL goal equal less than 160, ideally less than 130. No FH MI or CVA   Low back pain 03/13/2014  Chronic, has seen Dr Tamala Julian and Dr Lynann Bologna Taking tramadol as needed   Nonallopathic lesion of lumbosacral region 12/01/2015   Nonallopathic lesion of sacral region 12/01/2015   Nonallopathic lesion of thoracic region 12/01/2015   Prediabetes 03/29/2016   Mom, sister DM   PROSTATE CANCER, HX OF 03/12/2008   Qualifier: Diagnosis of  By: Linna Darner MD, [facility]   Radical prostatectomy 1998, Urologist Dr. Lawerance Bach     Past Surgical History:  Procedure Laterality Date   CATARACT EXTRACTION, BILATERAL  2017   colonoscopy with polypectomy  2014   Dr Sydnee Cabal [facility] SURGERY  2002   Dr Ellene Route   PROSTATECTOMY  1998   SHOULDER ARTHROSCOPY     right; 2011; left 1990   TONSILLECTOMY AND ADENOIDECTOMY      Social History   Socioeconomic History   Marital status: Married    Spouse name: Not on file   Number of children: Not on file   Years of education: Not on file   Highest education  level: Not on file  Occupational History   Occupation: retired  Tobacco Use   Smoking status: Former    Types: Cigarettes    Quit date: 01/31/1985    Years since quitting: 35.8   Smokeless tobacco: Never   Tobacco comments:    smoked  [place], up to 2.5 ppd  Vaping Use   Vaping Use: Never used  Substance and Sexual Activity   Alcohol use: No    Comment:  none since 2012   Drug use: No   Sexual activity: Yes  Other Topics Concern   Not on file  Social History Narrative   Exercises regularly - gym 5 days a week   Social Determinants of Health   Financial Resource Strain: Low Risk    Difficulty of Paying Living Expenses: Not hard at all  Food Insecurity: No Food Insecurity   Worried About Charity fundraiser in the Last Year: Never true   [place] in the Last Year: Never true  Transportation Needs: No Transportation Needs   Lack of Transportation (Medical): No   Lack of Transportation (Non-Medical): No  Physical Activity: Sufficiently Active   Days of Exercise per Week: 5 days   Minutes of Exercise per Session: 30 min  Stress: No Stress Concern Present   Feeling of Stress : Not at all  Social Connections: Socially Integrated   Frequency of Communication with Friends and Family: More than three times a week   Frequency of Social Gatherings with Friends and Family: More than three times a week   Attends Religious Services: More than 4 times per year   Active Member of [employer] or Organizations: Yes   Attends Music therapist: More than 4 times per year   Marital Status: Married  Human resources officer Violence: Not on file    Family History  Problem Relation Age of Onset   Diabetes Mother    Dementia Mother    Diabetes Sister    Lung cancer Other        maternal uncle & aunt   Colon cancer Neg Hx    Stomach cancer Neg Hx    Arthritis Neg Hx    Heart disease Neg Hx    Stroke Neg Hx    Esophageal cancer Neg Hx    Liver cancer Neg Hx    Pancreatic  cancer Neg Hx    Rectal cancer Neg Hx  ROS: no fevers or chills, productive cough, hemoptysis, dysphasia, odynophagia, melena, hematochezia, dysuria, hematuria, rash, seizure activity, orthopnea, PND, pedal edema, claudication. Remaining systems are negative.  Physical Exam: Well-developed well-nourished in no acute distress.  Skin is warm and dry.  HEENT is normal.  Neck is supple.  Chest is clear to auscultation with normal expansion.  Cardiovascular exam is regular rate and rhythm.  Abdominal exam nontender or distended. No masses palpated. Extremities show no edema. neuro grossly intact  ECG- Sinus with PACs and PVCs, no ST changes; personally reviewed  A/P  1 PVC/nonsustained ventricular tachycardia/abnormal ECG-patient remains asymptomatic.  He would like to avoid beta-blockade.  We will repeat his echocardiogram to make sure LV function is normal and ectopy not causing cardiomyopathy.  I will also arrange a calcium score for risk stratification.  2 dilated aortic root-plan follow-up echocardiogram.  3 chronic stage III kidney disease-Per primary care.  Kirk Ruths, MD

## 2020-11-17 ENCOUNTER — Encounter: Payer: Self-pay | Admitting: Cardiology

## 2020-11-17 ENCOUNTER — Ambulatory Visit (INDEPENDENT_AMBULATORY_CARE_PROVIDER_SITE_OTHER): Payer: Medicare Other | Admitting: Cardiology

## 2020-11-17 ENCOUNTER — Other Ambulatory Visit: Payer: Self-pay

## 2020-11-17 VITALS — BP 122/75 | HR 67 | Ht 71.0 in | Wt 178.0 lb

## 2020-11-17 DIAGNOSIS — I77810 Thoracic aortic ectasia: Secondary | ICD-10-CM | POA: Diagnosis not present

## 2020-11-17 DIAGNOSIS — Z136 Encounter for screening for cardiovascular disorders: Secondary | ICD-10-CM | POA: Diagnosis not present

## 2020-11-17 DIAGNOSIS — E7800 Pure hypercholesterolemia, unspecified: Secondary | ICD-10-CM | POA: Diagnosis not present

## 2020-11-17 DIAGNOSIS — I493 Ventricular premature depolarization: Secondary | ICD-10-CM

## 2020-11-17 DIAGNOSIS — R9431 Abnormal electrocardiogram [ECG] [EKG]: Secondary | ICD-10-CM | POA: Diagnosis not present

## 2020-11-17 NOTE — Patient Instructions (Signed)
  Testing/Procedures:  Your physician has requested that you have an echocardiogram. Echocardiography is a painless test that uses sound waves to create images of your heart. It provides your doctor with information about the size and shape of your heart and how well your heart's chambers and valves are working. This procedure takes approximately one hour. There are no restrictions for this procedure. [place]   Follow-Up: At [facility], you and your health needs are our priority.  As part of our continuing mission to provide you with exceptional heart care, we have created designated Provider Care Teams.  These Care Teams include your primary Cardiologist (physician) and Advanced Practice Providers (APPs -  Physician Assistants and Nurse Practitioners) who all work together to provide you with the care you need, when you need it.  We recommend signing up for the patient portal called "MyChart".  Sign up information is provided on this After Visit Summary.  MyChart is used to connect with patients for Virtual Visits (Telemedicine).  Patients are able to view lab/test results, encounter notes, upcoming appointments, etc.  Non-urgent messages can be sent to your provider as well.   To learn more about what you can do with MyChart, go to [email or web address].    Your next appointment:   12 month(s)  The format for your next appointment:   In Person  Provider:   Kirk Ruths, MD

## 2020-11-24 NOTE — Progress Notes (Signed)
Office Visit Note  Patient: Tom Gaines             Date of Birth: 14-Aug-1945           MRN: [id number]             PCP: Binnie Rail, MD Referring: Binnie Rail, MD Visit Date: 11/25/2020 Occupation: Retired Chief Executive Officer  Subjective:   History of Present Illness: ZOLTAN GENEST is a 75 y.o. male with a history of hyperlipidemia, CKD stage 3, gout, homocystinemia, chronic degenerative back pain and bilateral shoulder pain here for evaluation of positive rheumatoid factor test. He has stable chronic joint stiffness without severe exacerbations but has had a number of arthritis symptoms over time. He has some persistent stiffness in the back, with previous imaging obtained showing mild degenerative change. He has had physical therapy treatment for back stiffness and paraspinal muscle pain and stiffness. Shoulders on both sides had arthroscopy with good results, and has had local steroid injection a few years ago with symptom exacerbation. He notices episodes of increased pain in the thumb and index finger lasting less than a day. There is usually no significant visible swelling or erythema. He does not have numbness or loss of use in either hand. He is very physically active exercising about 5 days per week with a mixture of aerobic stairclimbing or walking exercise and yoga exercises.  Labs reviewed 10/2020 ANA neg RF 25 CBC wnl Lipids wnl Homocysteine 15.6 ESR 6  Activities of Daily Living:  Patient reports morning stiffness for 1 hour.   Patient Denies nocturnal pain.  Difficulty dressing/grooming: Denies Difficulty climbing stairs: Denies Difficulty getting out of chair: Denies Difficulty using hands for taps, buttons, cutlery, and/or writing: Denies  Review of Systems  Constitutional:  Negative for fatigue.  HENT:  Positive for nose dryness. Negative for mouth sores and mouth dryness.   Eyes:  Positive for dryness. Negative for pain and itching.  Respiratory:  Negative for  shortness of breath and difficulty breathing.   Cardiovascular:  Negative for chest pain and palpitations.  Gastrointestinal:  Negative for blood in stool, constipation and diarrhea.  Endocrine: Negative for increased urination.  Genitourinary:  Negative for difficulty urinating.  Musculoskeletal:  Positive for joint pain, joint pain and morning stiffness. Negative for joint swelling, myalgias, muscle tenderness and myalgias.  Skin:  Negative for color change, rash and redness.  Allergic/Immunologic: Negative for susceptible to infections.  Neurological:  Negative for dizziness, numbness, headaches, memory loss and weakness.  Hematological:  Negative for bruising/bleeding tendency.  Psychiatric/Behavioral:  Negative for confusion.    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   Homocystinemia ([place]) 10/10/2018   Nasal sore 08/27/2018   Bilateral shoulder pain 10/03/2017   CKD (chronic kidney disease) 03/31/2017   Bilateral shoulder bursitis 02/01/2017   Prediabetes 03/29/2016   Nonallopathic lesion of lumbosacral region 12/01/2015   Nonallopathic lesion of sacral region 12/01/2015   Nonallopathic lesion of thoracic region 12/01/2015   Low back pain 03/13/2014   Basal cell carcinoma 02/13/2013  Hyperlipidemia 03/12/2008   History of gout 03/12/2008   PROSTATE CANCER, HX OF 03/12/2008   History of colonic polyps 03/12/2008    Past Medical History:  Diagnosis Date   Allergy    Arthritis    Ascending  aorta dilatation ([place]) 10/11/2018   Echo 10/11/2018 40 mm   Basal cell carcinoma 02/13/2013   12/2012 chest; Dr Wilhemina Bonito 12/2013 nodular basal cell, 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 LDL goal equal less than 160, ideally less than 130. No FH MI or CVA   Low back pain 03/13/2014   Chronic, has seen Dr Tamala Julian and Dr Lynann Bologna Taking tramadol as needed   Nonallopathic lesion of lumbosacral region 12/01/2015   Nonallopathic lesion of sacral region 12/01/2015   Nonallopathic lesion of thoracic region 12/01/2015   Prediabetes 03/29/2016   Mom, sister DM   PROSTATE CANCER, HX OF 03/12/2008   Qualifier: Diagnosis of  By: Linna Darner MD, [facility] [address]   Radical prostatectomy 1998, Urologist Dr. Lawerance Bach     Family History  Problem Relation Age of Onset   Diabetes Mother    Dementia Mother    Diabetes Sister    Lung cancer Other        maternal uncle & aunt   Healthy Daughter    Colon cancer Neg Hx    Stomach cancer Neg Hx    Arthritis Neg Hx    Heart disease Neg Hx    Stroke Neg Hx    Esophageal cancer Neg Hx    Liver cancer Neg Hx    Pancreatic cancer Neg Hx    Rectal cancer Neg Hx    Past Surgical History:  Procedure Laterality Date   CATARACT EXTRACTION, BILATERAL  2017   colonoscopy with polypectomy  2014   Dr Fuller Plan   LUMBAR [facility] SURGERY  2002   Dr Ellene Route   PROSTATECTOMY  1998   SHOULDER ARTHROSCOPY     right; 2011; left [place]      Social History   Social History Narrative   Exercises regularly - gym 5 days a week   Immunization History  Administered Date(s) Administered   Fluad Quad(high Dose 65+) 10/04/2018   Influenza Whole 1945-03-13, 10/08/2008, 11/08/2011   Influenza, High Dose Seasonal PF 10/22/2012, 11/20/2015, 10/03/2017   Influenza,inj,Quad PF,6+ Mos 10/10/2014   Influenza-Unspecified 11/14/2016   PFIZER(Purple Top)SARS-COV-2 Vaccination 02/22/2019, 03/15/2019, 11/06/2019   Pneumococcal Conjugate-13 10/10/2014   Pneumococcal Polysaccharide-23 02/13/2013, 03/19/2020   Td 03/12/2008   Tdap 08/27/2018   Zoster Recombinat (Shingrix) 05/27/2016   Zoster, Live 05/30/2006     Objective: Vital Signs: BP 122/80 (BP Location: Right Arm, Patient Position: Sitting, Cuff Size: Normal)   Pulse 80   Ht 5' 11"  (1.803 m)   Wt 178 lb 3.2 oz (80.8 kg)   BMI 24.85 kg/m    Physical Exam HENT:  Right Ear: External ear normal.     Left Ear: External ear normal.     Mouth/Throat:     Mouth: Mucous membranes are moist.     Pharynx: Oropharynx is clear.  Eyes:     Conjunctiva/sclera: Conjunctivae normal.  Cardiovascular:     Rate and Rhythm: Normal rate and regular rhythm.  Pulmonary:     Effort: Pulmonary effort is normal.     Breath sounds: Normal breath sounds.  Skin:    General: Skin is warm and dry.     Findings: No rash.  Neurological:     Mental Status: He is alert.     Deep Tendon Reflexes: Reflexes normal.  Psychiatric:        Mood and Affect: Mood normal.     Musculoskeletal Exam:  Neck full ROM no tenderness Shoulders full ROM no tenderness or swelling Elbows full ROM no tenderness or swelling Wrists full ROM no tenderness or swelling Dupuytren's contracture of right 4th-5th fingers with preserved joint ROM Fingers ROM is intact, bony nodule on volar surface of left thumb, mild heberdon's nodes in 2nd-3rd DIPs, no focal tenderness or synovitis No paraspinal tenderness to palpation  over upper and lower back, mild scoliosis or side bending deviation in thoracic vertebra Knees full ROM no tenderness or swelling Ankles full ROM no tenderness or swelling   Investigation: No additional findings.  Imaging: No results found.  Recent Labs: Lab Results  Component Value Date   WBC 6.2 10/13/2020   HGB 14.4 10/13/2020   PLT 201.0 10/13/2020   NA 142 10/13/2020   K 4.6 10/13/2020   CL 105 10/13/2020   CO2 30 10/13/2020   GLUCOSE 88 10/13/2020   BUN 31 (H) 10/13/2020   CREATININE 1.46 10/13/2020   BILITOT 0.6 04/13/2020   ALKPHOS 40 04/13/2020   AST 25 04/13/2020   ALT 15 04/13/2020   PROT 7.1 04/13/2020   ALBUMIN 4.2 04/13/2020   CALCIUM 10.3 10/13/2020   GFRAA 79 03/04/2008    Speciality Comments: No specialty comments available.  Procedures:  No procedures performed Allergies: Patient has no known allergies.   Assessment / Plan:     Visit Diagnoses: Rheumatoid factor positive  Bilateral hand pain - Plan: XR Hand 2 View Right, XR Hand 2 View Left  Low positive rheumatoid factor I do not appreciate any specific clinical findings at this time. Previous CXR interstitial changes likely from acute infection and no persistent pulmonary complaints. Xray of bilateral hands obtained in clinic these demonstrate mild degree of osteoarthritis changes and no evidence for erosive disease. I recommended he can consider monitoring by repeat RF in 1 year but does not need close scheduled f/u with our office, can return PRN if noticing increased joint pain and swelling of concern. We discussed conservative treatment options for his osteoarthritis as well including supplements, topical antiinflammatory medication, compressive gloves, or option of OT referral or local injection treatments if worsening problems.  Bilateral shoulder bursitis  No current symptom complaint and ROM is intact at this time, previous arthroscopy and shoulder injections but not in exacerbation I do  not think this is suggestive for inflammatory arthritis process.  History of gout  Well controlled on allopurinol 300 mg daily, past episodes limited to right foot and most commonly describes association with high fructose food and drinks.  Orders: Orders Placed This Encounter  Procedures   XR Hand 2 View Right   XR Hand 2 View Left    No orders of the  defined types were placed in this encounter.   Follow-Up Instructions: Return if symptoms worsen or fail to improve.   Collier Salina, MD  Note - This record has been created using [employer].  Chart creation errors have been sought, but may not always  have been located. Such creation errors do not reflect on  the standard of medical care.

## 2020-11-25 ENCOUNTER — Ambulatory Visit (INDEPENDENT_AMBULATORY_CARE_PROVIDER_SITE_OTHER): Payer: Medicare Other

## 2020-11-25 ENCOUNTER — Encounter: Payer: Self-pay | Admitting: Internal Medicine

## 2020-11-25 ENCOUNTER — Ambulatory Visit (INDEPENDENT_AMBULATORY_CARE_PROVIDER_SITE_OTHER): Payer: Medicare Other | Admitting: Internal Medicine

## 2020-11-25 ENCOUNTER — Other Ambulatory Visit: Payer: Self-pay

## 2020-11-25 VITALS — BP 122/80 | HR 80 | Ht 71.0 in | Wt 178.2 lb

## 2020-11-25 DIAGNOSIS — M79642 Pain in left hand: Secondary | ICD-10-CM | POA: Diagnosis not present

## 2020-11-25 DIAGNOSIS — M7551 Bursitis of right shoulder: Secondary | ICD-10-CM

## 2020-11-25 DIAGNOSIS — R768 Other specified abnormal immunological findings in serum: Secondary | ICD-10-CM

## 2020-11-25 DIAGNOSIS — M79641 Pain in right hand: Secondary | ICD-10-CM | POA: Diagnosis not present

## 2020-11-25 DIAGNOSIS — M7552 Bursitis of left shoulder: Secondary | ICD-10-CM

## 2020-11-25 DIAGNOSIS — Z8739 Personal history of other diseases of the musculoskeletal system and connective tissue: Secondary | ICD-10-CM

## 2020-11-25 NOTE — Patient Instructions (Signed)
I do not see current evidence of rheumatoid arthritis disease activity or evidence of chronic joint damages from inflammatory arthritis. It would be reasonable to recheck this lab test in about a year but does not always indicate future disease development.   For osteoarthritis of the hand several treatments may be beneficial: - Topical antiinflammatory medicine such as diclofenac or Voltaren can be applied to  affected area as needed but may be less effective than oral antiinflammatory medicine. Topical analgesics containing CBD, menthol, or lidocaine can be tried. Capsaicin containing treatments are recommended against for the hand.  - Other oral supplements such as glucosamine chondroitin containing OTC treatments such as osteo bi-flex or other brands do not have strong data supporting effectiveness but can be helpful for some individuals and have no major side effects. Turmeric has some antiinflammatory effect similar to NSAID medications and may help, if taken as a supplement should not be taken above recommended doses.  - Compressive gloves can be helpful to support the thumb joint especially if hurting with certain activities. Effectiveness has generally shown to be most with use for 8 hours of the day or during symptom provoking activities.  - Occupational therapy referral is an option to discuss exercises or activity modification to improve symptoms or hand strength if needed.  - Local steroid injection is an option if symptoms become worse and not controlled by the above options.

## 2020-11-30 DIAGNOSIS — J343 Hypertrophy of nasal turbinates: Secondary | ICD-10-CM | POA: Diagnosis not present

## 2020-11-30 DIAGNOSIS — R430 Anosmia: Secondary | ICD-10-CM | POA: Diagnosis not present

## 2020-11-30 DIAGNOSIS — J310 Chronic rhinitis: Secondary | ICD-10-CM | POA: Diagnosis not present

## 2020-12-01 ENCOUNTER — Other Ambulatory Visit: Payer: Self-pay | Admitting: Internal Medicine

## 2020-12-01 ENCOUNTER — Other Ambulatory Visit: Payer: Self-pay | Admitting: Allergy

## 2020-12-08 ENCOUNTER — Encounter: Payer: Self-pay | Admitting: Internal Medicine

## 2020-12-08 DIAGNOSIS — R1310 Dysphagia, unspecified: Secondary | ICD-10-CM

## 2020-12-10 NOTE — Addendum Note (Signed)
Addended by: Binnie Rail on: 12/10/2020 07:49 PM   Modules accepted: Orders

## 2020-12-21 ENCOUNTER — Other Ambulatory Visit: Payer: Self-pay

## 2020-12-21 ENCOUNTER — Ambulatory Visit (INDEPENDENT_AMBULATORY_CARE_PROVIDER_SITE_OTHER)
Admission: RE | Admit: 2020-12-21 | Discharge: 2020-12-21 | Disposition: A | Discharge status: Home / Self Care | Payer: Self-pay | Source: Ambulatory Visit | Attending: Cardiology | Admitting: Cardiology

## 2020-12-21 ENCOUNTER — Ambulatory Visit (HOSPITAL_COMMUNITY): Payer: Medicare Other | Attending: Cardiovascular Disease

## 2020-12-21 DIAGNOSIS — R9431 Abnormal electrocardiogram [ECG] [EKG]: Secondary | ICD-10-CM | POA: Diagnosis not present

## 2020-12-21 DIAGNOSIS — I77810 Thoracic aortic ectasia: Secondary | ICD-10-CM | POA: Insufficient documentation

## 2020-12-21 DIAGNOSIS — I493 Ventricular premature depolarization: Secondary | ICD-10-CM | POA: Diagnosis not present

## 2020-12-21 DIAGNOSIS — Z136 Encounter for screening for cardiovascular disorders: Secondary | ICD-10-CM

## 2020-12-21 LAB — ECHOCARDIOGRAM COMPLETE
Area-P 1/2: 2.73 cm2
S' Lateral: 3.1 cm

## 2020-12-23 ENCOUNTER — Telehealth: Payer: Self-pay | Admitting: *Deleted

## 2020-12-23 DIAGNOSIS — E7800 Pure hypercholesterolemia, unspecified: Secondary | ICD-10-CM

## 2020-12-23 MED ORDER — ROSUVASTATIN CALCIUM 20 MG PO TABS: 20.0000 mg | ORAL_TABLET | Freq: Every day | ORAL | 3 refills | Status: DC

## 2020-12-23 NOTE — Telephone Encounter (Signed)
-----   Message from Lelon Perla, MD sent at 12/21/2020  6:21 PM EST ----- Elevated Ca score; add crestor 20 mg daily; CK, lipids and liver 8 weeks [facility]

## 2020-12-23 NOTE — Telephone Encounter (Signed)
pt aware of results  New script sent to the pharmacy  Lab orders mailed to the pt  

## 2020-12-24 ENCOUNTER — Other Ambulatory Visit: Payer: Self-pay | Admitting: Allergy

## 2021-02-25 DIAGNOSIS — E7800 Pure hypercholesterolemia, unspecified: Secondary | ICD-10-CM | POA: Diagnosis not present

## 2021-02-25 LAB — LIPID PANEL
Chol/HDL Ratio: 1.9 ratio (ref 0.0–5.0)
Cholesterol, Total: 124 mg/dL (ref 100–199)
HDL: 64 mg/dL (ref >39)
LDL Chol Calc (NIH): 49 mg/dL (ref 0–99)
Triglycerides: 47 mg/dL (ref 0–149)
VLDL Cholesterol Cal: 11 mg/dL (ref 5–40)

## 2021-02-25 LAB — HEPATIC FUNCTION PANEL
ALT: 18 IU/L (ref 0–44)
AST: 35 IU/L (ref 0–40)
Albumin: 4.8 g/dL — ABNORMAL HIGH (ref 3.7–4.7)
Alkaline Phosphatase: 47 IU/L (ref 44–121)
Bilirubin Total: 0.4 mg/dL (ref 0.0–1.2)
Bilirubin, Direct: 0.18 mg/dL (ref 0.00–0.40)
Total Protein: 6.7 g/dL (ref 6.0–8.5)

## 2021-02-28 ENCOUNTER — Encounter: Payer: Self-pay | Admitting: Cardiology

## 2021-02-28 ENCOUNTER — Encounter: Payer: Self-pay | Admitting: Internal Medicine

## 2021-03-03 ENCOUNTER — Other Ambulatory Visit: Payer: Self-pay

## 2021-03-03 ENCOUNTER — Ambulatory Visit (INDEPENDENT_AMBULATORY_CARE_PROVIDER_SITE_OTHER): Payer: Medicare Other

## 2021-03-03 ENCOUNTER — Encounter: Payer: Self-pay | Admitting: Gastroenterology

## 2021-03-03 DIAGNOSIS — Z0000 Encounter for general adult medical examination without abnormal findings: Secondary | ICD-10-CM

## 2021-03-03 NOTE — Progress Notes (Signed)
I connected with Glendora Score today by telephone and verified that I am speaking with the correct person using two identifiers. Location patient: home Location provider: work Persons participating in the virtual visit: patient, provider.   I discussed the limitations, risks, security and privacy concerns of performing an evaluation and management service by telephone and the availability of in person appointments. I also discussed with the patient that there may be a patient responsible charge related to this service. The patient expressed understanding and verbally consented to this telephonic visit.    Interactive audio and video telecommunications were attempted between this provider and patient, however failed, due to patient having technical difficulties OR patient did not have access to video capability.  We continued and completed visit with audio only.  Some vital signs may be absent or patient reported.   Time Spent with patient on telephone encounter: 40 minutes  Subjective:   Tom Gaines is a 76 y.o. male who presents for Medicare Annual/Subsequent preventive examination.  Review of Systems     Cardiac Risk Factors include: advanced age (>60men, >32 women);dyslipidemia;male gender     Objective:    There were no vitals filed for this visit. There is no height or weight on file to calculate BMI.  Advanced Directives 03/03/2021 01/30/2020 04/10/2018 04/26/2017 03/31/2017 07/28/2015 03/21/2014  Does Patient Have a Medical Advance Directive? Yes Yes Yes No Yes Yes No;Yes  Type of Advance Directive Living will;Healthcare Power of [place];Living will - [place];Living will - [place];Living will;Out of facility DNR (pink MOST or yellow form)  Does patient want to make changes to medical advance directive? No - Patient declined No - Patient declined - - - - -  Copy of [place] in Chart? No -  copy requested - No - copy requested - No - copy requested No - copy requested No - copy requested    Current Medications (verified) Outpatient Encounter Medications as of 03/03/2021  Medication Sig   co-enzyme Q-10 30 MG capsule Take 100 mg by mouth daily.   acetaminophen (TYLENOL) 500 MG tablet Take 500 mg by mouth every 6 (six) hours as needed.   acyclovir (ZOVIRAX) 800 MG tablet Take 400 mg by mouth 2 (two) times daily.   allopurinol (ZYLOPRIM) 300 MG tablet TAKE 1 TABLET BY MOUTH EVERY DAY   Alpha-Lipoic Acid 600 MG CAPS Take by mouth.   Ascorbic Acid (VITAMIN C) 100 MG tablet Take 100 mg by mouth daily.   ASHWAGANDHA PO Take 300 mg by mouth 2 (two) times daily.   Azelastine HCl 137 MCG/SPRAY SOLN PLACE 1-2 SPRAYS INTO BOTH NOSTRILS 2 (TWO) TIMES DAILY AS NEEDED. USE IN EACH NOSTRIL AS DIRECTED   AZELASTINE HCL OP as needed.   B Complex Vitamins (VITAMIN B COMPLEX PO)    Bioflavonoid Products (CITRUS BIOFLAVONOIDS PO)    BREWERS YEAST PO Take by mouth.   cetirizine (ZYRTEC) 10 MG tablet Take 10 mg by mouth as needed.    Cholecalciferol (VITAMIN D) 2000 units CAPS Take 1 capsule by mouth daily.   fenofibrate (TRICOR) 145 MG tablet TAKE 1 TABLET BY MOUTH EVERY DAY   FOLIC ACID PO 9,767 mcg.   L-CITRULLINE PO Take by mouth.   LYSINE PO Take 1,000 mg by mouth.   medium chain triglycerides (MCT OIL) oil Take by mouth 3 (three) times daily.   Multiple Vitamin (MULTIVITAMIN) tablet Take 1 tablet by mouth daily.  Omega-3 Fatty Acids (FISH OIL) 1000 MG CAPS Take 1,000 mg by mouth. 1 by mouth daily   Pyridoxine HCl (VITAMIN B6) 100 MG TABS    Quercetin 500 MG CAPS Take by mouth.   RESVERATROL PO Take by mouth. Patient takes bid   rosuvastatin (CRESTOR) 20 MG tablet Take 1 tablet (20 mg total) by mouth daily.   traMADol (ULTRAM) 50 MG tablet TAKE 1 TABLET (50 MG TOTAL) BY MOUTH DAILY. FOR CHRONIC BACK PAIN   Turmeric Curcumin 500 MG CAPS Take 500 mg by mouth 3 (three) times daily.     Vitamin D-Vitamin K (D3 + K2 DOTS PO)    [DISCONTINUED] Capsicum, Cayenne, (CAYENNE PO) Take by mouth.   [DISCONTINUED] Menaquinone-7 (VITAMIN K2 PO) Take 120 mg by mouth daily.   [DISCONTINUED] UBIQUINOL PO Take 100 mg by mouth.   No facility-administered encounter medications on file as of 03/03/2021.    Allergies (verified) Patient has no known allergies.   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 LDL goal equal less than 160, ideally less than 130. No FH MI or CVA   Low back pain 03/13/2014   Chronic, has seen Dr Tamala Julian and Dr Lynann Bologna Taking tramadol as needed   Nonallopathic lesion of lumbosacral region 12/01/2015   Nonallopathic lesion of sacral region 12/01/2015   Nonallopathic lesion of thoracic region 12/01/2015   Prediabetes 03/29/2016   Mom, sister DM   PROSTATE CANCER, HX OF 03/12/2008   Qualifier: Diagnosis of  By: Linna Darner MD, [facility]   Radical prostatectomy 1998, Urologist Dr. Lawerance Bach    Past Surgical History:  Procedure Laterality Date   CATARACT EXTRACTION, BILATERAL  2017   colonoscopy with polypectomy  2014   Dr Fuller Plan   LUMBAR [facility] SURGERY  2002   Dr Ellene Route   PROSTATECTOMY  1998   SHOULDER ARTHROSCOPY     right; 2011; left 1990    SIGMOIDOSCOPY     TONSILLECTOMY AND ADENOIDECTOMY     Family History  Problem Relation Age of Onset   Diabetes Mother    Dementia Mother    Diabetes Sister    Lung cancer Other        maternal uncle & aunt   Healthy Daughter    Colon cancer Neg Hx    Stomach cancer Neg Hx    Arthritis Neg Hx    Heart disease Neg Hx    Stroke Neg Hx    Esophageal cancer Neg Hx    Liver cancer Neg Hx    Pancreatic cancer Neg Hx    Rectal cancer Neg Hx    Social History   Socioeconomic History   Marital status:  Married    Spouse name: Not on file   Number of children: Not on file   Years of education: Not on file   Highest education level: Not on file  Occupational History   Occupation: retired  Tobacco Use   Smoking status: Former    Types: Cigarettes    Quit date: 01/31/1985    Years since quitting: 36.1   Smokeless tobacco: Never   Tobacco comments:    smoked  [place], up to 2.5 ppd  Vaping Use   Vaping Use: Never used  Substance and Sexual Activity   Alcohol use: No    Comment:  none since 2012   Drug use: No   Sexual activity: Yes  Other Topics Concern   Not on file  Social History Narrative   Exercises regularly - gym 5 days a week   Social Determinants of Health   Financial Resource Strain: Low Risk    Difficulty of Paying Living Expenses: Not hard at all  Food Insecurity: No Food Insecurity   Worried About Charity fundraiser in the Last Year: Never true   [place] in the Last Year: Never true  Transportation Needs: No Transportation Needs   Lack of Transportation (Medical): No   Lack of Transportation (Non-Medical): No  Physical Activity: Sufficiently Active   Days of Exercise per Week: 5 days   Minutes of Exercise per Session: 30 min  Stress: No Stress Concern Present   Feeling of Stress : Not at all  Social Connections: Socially Integrated   Frequency of Communication with Friends and Family: More than three times a week   Frequency of Social  Gatherings with Friends and Family: More than three times a week   Attends Religious Services: More than 4 times per year   Active Member of [employer] or Organizations: Yes   Attends Music therapist: More than 4 times per year   Marital Status: Married    Tobacco Counseling Counseling given: Not Answered Tobacco comments: smoked  [place], up to 2.5 ppd   Clinical Intake:  Pre-visit preparation completed: Yes  Pain : No/denies pain     Nutritional Risks: None Diabetes: No  How often do you need to have someone help you when you read instructions, pamphlets, or other written materials from your doctor or pharmacy?: 1 - Never What is the last grade level you completed in school?: Law School  Diabetic? no  Interpreter Needed?: No  Information entered by :: Lisette Abu, LPN   Activities of Daily Living In your present state of health, do you have any difficulty performing the following activities: 03/03/2021  Hearing? N  Vision? N  Difficulty concentrating or making decisions? N  Walking or climbing stairs? N  Dressing or bathing? N  Doing errands, shopping? N  Preparing Food and eating ? N  Using the Toilet? N  In the past six months, have you accidently leaked urine? N  Do you have problems with loss of bowel control? N  Managing your Medications? N  Managing your Finances? N  Housekeeping or managing your Housekeeping? N  Some recent data might be hidden    Patient Care Team: Binnie Rail, MD as PCP - General (Internal Medicine) Stanford Breed Denice Bors, MD as PCP - Cardiology (Cardiology) Clent Jacks, MD as Consulting Physician (Ophthalmology)  Indicate any recent Medical Services you may have received from other than [facility] providers in the past year (date may  be approximate).     Assessment:   This is a routine wellness examination for Tom Gaines.  Hearing/Vision screen Hearing Screening - Comments:: Patient denied any hearing difficulty.    No hearing aids.  Vision Screening - Comments:: Patient wears corrective glasses/contacts.  Eye exam done annually by: Clent Jacks, MD.  Dietary issues and exercise activities discussed: Current Exercise Habits: Home exercise routine, Type of exercise: walking, Time (Minutes): 30, Frequency (Times/Week): 5, Weekly Exercise (Minutes/Week): 150, Intensity: Moderate, Exercise limited by: None identified   Goals Addressed               This Visit's Progress     Patient Stated (pt-stated)        Patient declined health goal at this time.      Depression Screen PHQ 2/9 Scores 03/03/2021 01/30/2020 01/06/2020 04/10/2018 03/31/2017 07/08/2016 03/30/2015  PHQ - 2 Score 0 0 0 0 0 0 0  PHQ- 9 Score - - - - 0 - -    Fall Risk Fall Risk  03/03/2021 01/30/2020 01/06/2020 04/10/2018 03/31/2017  Falls in the past year? 0 0 0 0 No  Number falls in past yr: 0 0 0 - -  Injury with Fall? 0 0 0 - -  Risk for fall due to : No Fall Risks No Fall Risks No Fall Risks - -  Follow up Falls evaluation completed Falls evaluation completed Falls evaluation completed - -    FALL RISK PREVENTION PERTAINING TO THE HOME:  Any stairs in or around the home? Yes  If so, are there any without handrails? No  Home free of loose throw rugs in walkways, pet beds, electrical cords, etc? Yes  Adequate lighting in your home to reduce risk of falls? Yes   ASSISTIVE DEVICES UTILIZED TO PREVENT FALLS:  Life alert? No  Use of a cane, walker or w/c? No  Grab bars in the bathroom? Yes  Shower chair or bench in shower? Yes  Elevated toilet seat or a handicapped toilet? Yes   TIMED UP AND GO:  Was the test performed? No .  Length of time to ambulate 10 feet: n/a sec.   Gait steady and fast without use of assistive device  Cognitive Function: Normal cognitive status assessed by direct observation by this Nurse Health Advisor. No abnormalities found.          Immunizations Immunization History  Administered Date(s)  Administered   Fluad Quad(high Dose 65+) 10/04/2018   Influenza Whole 08/09/1945, 10/08/2008, 11/08/2011   Influenza, High Dose Seasonal PF 10/22/2012, 11/20/2015, 10/03/2017   Influenza,inj,Quad PF,6+ Mos 10/10/2014   Influenza-Unspecified 11/14/2016, 11/09/2020   PFIZER(Purple Top)SARS-COV-2 Vaccination 02/22/2019, 03/15/2019, 11/06/2019, 06/18/2020   Pfizer Covid-19 Vaccine Bivalent Booster 17yrs & up 11/09/2020   Pneumococcal Conjugate-13 10/10/2014   Pneumococcal Polysaccharide-23 02/13/2013, 03/19/2020   Td 03/12/2008   Tdap 08/27/2018   Zoster Recombinat (Shingrix) 05/27/2016, 08/31/2016   Zoster, Live 05/30/2006    TDAP status: Up to date  Flu Vaccine status: Up to date  Pneumococcal vaccine status: Up to date  Covid-19 vaccine status: Completed vaccines  Qualifies for Shingles Vaccine? Yes   Zostavax completed Yes   Shingrix Completed?: Yes  Screening Tests Health Maintenance  Topic Date Due   COLONOSCOPY (Pts 45-32yrs Insurance coverage will need to be confirmed)  04/27/2022   TETANUS/TDAP  08/26/2028   Pneumonia Vaccine 45+ Years old  Completed   INFLUENZA VACCINE  Completed   COVID-19 Vaccine  Completed   Hepatitis C Screening  Completed  Zoster Vaccines- Shingrix  Completed   HPV VACCINES  Aged Out    Health Maintenance  There are no preventive care reminders to display for this patient.   Colorectal cancer screening: No longer required.   Lung Cancer Screening: (Low Dose CT Chest recommended if Age 20-80 years, 30 pack-year currently smoking OR have quit w/in 15years.) does not qualify.   Lung Cancer Screening Referral: no  Additional Screening:  Hepatitis C Screening: does qualify; Completed yes  Vision Screening: Recommended annual ophthalmology exams for early detection of glaucoma and other disorders of the eye. Is the patient up to date with their annual eye exam?  Yes  Who is the provider or what is the name of the office in which the  patient attends annual eye exams? Clent Jacks, MD. If pt is not established with a provider, would they like to be referred to a provider to establish care? No .   Dental Screening: Recommended annual dental exams for proper oral hygiene  Community Resource Referral / Chronic Care Management: CRR required this visit?  No   CCM required this visit?  No      Plan:     I have personally reviewed and noted the following in the patients chart:   Medical and social history Use of alcohol, tobacco or illicit drugs  Current medications and supplements including opioid prescriptions. Patient is not currently taking opioid prescriptions. Functional ability and status Nutritional status Physical activity Advanced directives List of other physicians Hospitalizations, surgeries, and ER visits in previous 12 months Vitals Screenings to include cognitive, depression, and falls Referrals and appointments  In addition, I have reviewed and discussed with patient certain preventive protocols, quality metrics, and best practice recommendations. A written personalized care plan for preventive services as well as general preventive health recommendations were provided to patient.     Sheral Flow, LPN   04/06/1222   Nurse Notes:  Patient is cogitatively intact. There were no vitals filed for this visit. There is no height or weight on file to calculate BMI. Patient stated that he has no issues with gait or balance; does not use any assistive devices. Medications reviewed with patient; no opioid use noted.

## 2021-03-03 NOTE — Patient Instructions (Signed)
Tom Gaines , Thank you for taking time to come for your Medicare Wellness Visit. I appreciate your ongoing commitment to your health goals. Please review the following plan we discussed and let me know if I can assist you in the future.   Screening recommendations/referrals: Colonoscopy: 05/10/2017; due every 5 years Recommended yearly ophthalmology/optometry visit for glaucoma screening and checkup Recommended yearly dental visit for hygiene and checkup  Vaccinations: Influenza vaccine: 11/09/2020 Pneumococcal vaccine: 10/10/2014, 03/19/2020 Tdap vaccine: 08/27/2018; due every 10 years Shingles vaccine: 05/27/2016,    Covid-19: 02/22/2019, 03/15/2019, 11/06/2019, 06/18/2020, 11/09/2020  Advanced directives: Please bring a copy of your health care power of attorney and living will to the office at your convenience.  Conditions/risks identified: Yes; Client understands the importance of follow-up with providers by attending scheduled visits and discussed goals to eat healthier, increase physical activity, exercise the brain, socialize more, get enough sleep and make time for laughter.  Next appointment: Please schedule your next Medicare Wellness Visit with your Nurse Health Advisor in 1 year by calling [phone].  Preventive Care 76 Years and Older, Male Preventive care refers to lifestyle choices and visits with your health care provider that can promote health and wellness. What does preventive care include? A yearly physical exam. This is also called an annual well check. Dental exams once or twice a year. Routine eye exams. Ask your health care provider how often you should have your eyes checked. Personal lifestyle choices, including: Daily care of your teeth and gums. Regular physical activity. Eating a healthy diet. Avoiding tobacco and drug use. Limiting alcohol use. Practicing safe sex. Taking low doses of aspirin every day. Taking vitamin and mineral supplements as recommended by  your health care provider. What happens during an annual well check? The services and screenings done by your health care provider during your annual well check will depend on your age, overall health, lifestyle risk factors, and family history of disease. Counseling  Your health care provider may ask you questions about your: Alcohol use. Tobacco use. Drug use. Emotional well-being. Home and relationship well-being. Sexual activity. Eating habits. History of falls. Memory and ability to understand (cognition). Work and work Statistician. Screening  You may have the following tests or measurements: Height, weight, and BMI. Blood pressure. Lipid and cholesterol levels. These may be checked every 5 years, or more frequently if you are over 58 years old. Skin check. Lung cancer screening. You may have this screening every year starting at age 38 if you have a 30-pack-year history of smoking and currently smoke or have quit within the past 15 years. Fecal occult blood test (FOBT) of the stool. You may have this test every year starting at age 61. Flexible sigmoidoscopy or colonoscopy. You may have a sigmoidoscopy every 5 years or a colonoscopy every 10 years starting at age 10. Prostate cancer screening. Recommendations will vary depending on your family history and other risks. Hepatitis C blood test. Hepatitis B blood test. Sexually transmitted disease (STD) testing. Diabetes screening. This is done by checking your blood sugar (glucose) after you have not eaten for a while (fasting). You may have this done every 1-3 years. Abdominal aortic aneurysm (AAA) screening. You may need this if you are a current or former smoker. Osteoporosis. You may be screened starting at age 66 if you are at high risk. Talk with your health care provider about your test results, treatment options, and if necessary, the need for more tests. Vaccines  Your health care provider  may recommend certain vaccines,  such as: Influenza vaccine. This is recommended every year. Tetanus, diphtheria, and acellular pertussis (Tdap, Td) vaccine. You may need a Td booster every 10 years. Zoster vaccine. You may need this after age 66. Pneumococcal 13-valent conjugate (PCV13) vaccine. One dose is recommended after age 29. Pneumococcal polysaccharide (PPSV23) vaccine. One dose is recommended after age 60. Talk to your health care provider about which screenings and vaccines you need and how often you need them. This information is not intended to replace advice given to you by your health care provider. Make sure you discuss any questions you have with your health care provider. Document Released: 02/13/2015 Document Revised: 10/07/2015 Document Reviewed: 11/18/2014 Elsevier Interactive Patient Education  2017 [place] Prevention in the Home Falls can cause injuries. They can happen to people of all ages. There are many things you can do to make your home safe and to help prevent falls. What can I do on the outside of my home? Regularly fix the edges of walkways and driveways and fix any cracks. Remove anything that might make you trip as you walk through a door, such as a raised step or threshold. Trim any bushes or trees on the path to your home. Use bright outdoor lighting. Clear any walking paths of anything that might make someone trip, such as rocks or tools. Regularly check to see if handrails are loose or broken. Make sure that both sides of any steps have handrails. Any raised decks and porches should have guardrails on the edges. Have any leaves, snow, or ice cleared regularly. Use sand or salt on walking paths during winter. Clean up any spills in your garage right away. This includes oil or grease spills. What can I do in the bathroom? Use night lights. Install grab bars by the toilet and in the tub and shower. Do not use towel bars as grab bars. Use non-skid mats or decals in the tub or  shower. If you need to sit down in the shower, use a plastic, non-slip stool. Keep the floor dry. Clean up any water that spills on the floor as soon as it happens. Remove soap buildup in the tub or shower regularly. Attach bath mats securely with double-sided non-slip rug tape. Do not have throw rugs and other things on the floor that can make you trip. What can I do in the bedroom? Use night lights. Make sure that you have a light by your bed that is easy to reach. Do not use any sheets or blankets that are too big for your bed. They should not hang down onto the floor. Have a firm chair that has side arms. You can use this for support while you get dressed. Do not have throw rugs and other things on the floor that can make you trip. What can I do in the kitchen? Clean up any spills right away. Avoid walking on wet floors. Keep items that you use a lot in easy-to-reach places. If you need to reach something above you, use a strong step stool that has a grab bar. Keep electrical cords out of the way. Do not use floor polish or wax that makes floors slippery. If you must use wax, use non-skid floor wax. Do not have throw rugs and other things on the floor that can make you trip. What can I do with my stairs? Do not leave any items on the stairs. Make sure that there are handrails on both sides  of the stairs and use them. Fix handrails that are broken or loose. Make sure that handrails are as long as the stairways. Check any carpeting to make sure that it is firmly attached to the stairs. Fix any carpet that is loose or worn. Avoid having throw rugs at the top or bottom of the stairs. If you do have throw rugs, attach them to the floor with carpet tape. Make sure that you have a light switch at the top of the stairs and the bottom of the stairs. If you do not have them, ask someone to add them for you. What else can I do to help prevent falls? Wear shoes that: Do not have high heels. Have  rubber bottoms. Are comfortable and fit you well. Are closed at the toe. Do not wear sandals. If you use a stepladder: Make sure that it is fully opened. Do not climb a closed stepladder. Make sure that both sides of the stepladder are locked into place. Ask someone to hold it for you, if possible. Clearly mark and make sure that you can see: Any grab bars or handrails. First and last steps. Where the edge of each step is. Use tools that help you move around (mobility aids) if they are needed. These include: Canes. Walkers. Scooters. Crutches. Turn on the lights when you go into a dark area. Replace any light bulbs as soon as they burn out. Set up your furniture so you have a clear path. Avoid moving your furniture around. If any of your floors are uneven, fix them. If there are any pets around you, be aware of where they are. Review your medicines with your doctor. Some medicines can make you feel dizzy. This can increase your chance of falling. Ask your doctor what other things that you can do to help prevent falls. This information is not intended to replace advice given to you by your health care provider. Make sure you discuss any questions you have with your health care provider. Document Released: 11/13/2008 Document Revised: 06/25/2015 Document Reviewed: 02/21/2014 Elsevier Interactive Patient Education  2017 [employer].

## 2021-03-29 DIAGNOSIS — M19041 Primary osteoarthritis, right hand: Secondary | ICD-10-CM | POA: Diagnosis not present

## 2021-03-29 DIAGNOSIS — M25531 Pain in right wrist: Secondary | ICD-10-CM | POA: Diagnosis not present

## 2021-03-29 DIAGNOSIS — M19031 Primary osteoarthritis, right wrist: Secondary | ICD-10-CM | POA: Diagnosis not present

## 2021-03-29 DIAGNOSIS — Z20822 Contact with and (suspected) exposure to covid-19: Secondary | ICD-10-CM | POA: Diagnosis not present

## 2021-04-03 DIAGNOSIS — Z20828 Contact with and (suspected) exposure to other viral communicable diseases: Secondary | ICD-10-CM | POA: Diagnosis not present

## 2021-04-12 NOTE — Progress Notes (Unsigned)
HPI:FU PVCs and CAD.  ETT November 2011 showed no ST changes.  Monitor August 2020 showed sinus bradycardia to sinus tachycardia, frequent PVCs and 6 beats of nonsustained ventricular tachycardia.  Abdominal ultrasound September 2020 showed no aneurysm.  Calcium score November 2022 320 which was 54th percentile; ascending aorta measured at 4 cm.  Echocardiogram November 2022 showed normal LV function, grade 1 diastolic dysfunction, mildly dilated ascending aorta at 41 mm.  Note patient's aorta was noted to be 4 cm on echocardiogram September 2020.  Since last seen   Current Outpatient Medications  Medication Sig Dispense Refill   acetaminophen (TYLENOL) 500 MG tablet Take 500 mg by mouth every 6 (six) hours as needed.     acyclovir (ZOVIRAX) 800 MG tablet Take 400 mg by mouth 2 (two) times daily.     allopurinol (ZYLOPRIM) 300 MG tablet TAKE 1 TABLET BY MOUTH EVERY DAY 90 tablet 1   Alpha-Lipoic Acid 600 MG CAPS Take by mouth.     Ascorbic Acid (VITAMIN C) 100 MG tablet Take 100 mg by mouth daily.     ASHWAGANDHA PO Take 300 mg by mouth 2 (two) times daily.     Azelastine HCl 137 MCG/SPRAY SOLN PLACE 1-2 SPRAYS INTO BOTH NOSTRILS 2 (TWO) TIMES DAILY AS NEEDED. USE IN EACH NOSTRIL AS DIRECTED 30 mL 3   AZELASTINE HCL OP as needed.     B Complex Vitamins (VITAMIN B COMPLEX PO)      Bioflavonoid Products (CITRUS BIOFLAVONOIDS PO)      BREWERS YEAST PO Take by mouth.     cetirizine (ZYRTEC) 10 MG tablet Take 10 mg by mouth as needed.      Cholecalciferol (VITAMIN D) 2000 units CAPS Take 1 capsule by mouth daily.     co-enzyme Q-10 30 MG capsule Take 100 mg by mouth daily.     fenofibrate (TRICOR) 145 MG tablet TAKE 1 TABLET BY MOUTH EVERY DAY 90 tablet 2   FOLIC ACID PO 3,810 mcg.     L-CITRULLINE PO Take by mouth.     LYSINE PO Take 1,000 mg by mouth.     medium chain triglycerides (MCT OIL) oil Take by mouth 3 (three) times daily.     Multiple Vitamin (MULTIVITAMIN) tablet Take 1  tablet by mouth daily.     Omega-3 Fatty Acids (FISH OIL) 1000 MG CAPS Take 1,000 mg by mouth. 1 by mouth daily     Pyridoxine HCl (VITAMIN B6) 100 MG TABS      Quercetin 500 MG CAPS Take by mouth.     RESVERATROL PO Take by mouth. Patient takes bid     rosuvastatin (CRESTOR) 20 MG tablet Take 1 tablet (20 mg total) by mouth daily. 90 tablet 3   traMADol (ULTRAM) 50 MG tablet TAKE 1 TABLET (50 MG TOTAL) BY MOUTH DAILY. FOR CHRONIC BACK PAIN 90 tablet 0   Turmeric Curcumin 500 MG CAPS Take 500 mg by mouth 3 (three) times daily.      Vitamin D-Vitamin K (D3 + K2 DOTS PO)      No current facility-administered medications for this visit.     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 LDL goal equal less than 160, ideally less than 130. No FH MI or CVA   Low back pain 03/13/2014   Chronic, has seen Dr Tamala Julian and Dr Lynann Bologna Taking tramadol as needed   Nonallopathic lesion of lumbosacral region 12/01/2015   Nonallopathic lesion of sacral region 12/01/2015   Nonallopathic lesion of thoracic region 12/01/2015   Prediabetes 03/29/2016   Mom, sister DM   PROSTATE CANCER, HX OF 03/12/2008   Qualifier: Diagnosis of  By: Linna Darner MD, [facility]   Radical prostatectomy 1998, Urologist  Dr. Lawerance Bach     Past Surgical History:  Procedure Laterality Date   CATARACT EXTRACTION, BILATERAL  2017   colonoscopy with polypectomy  2014   Dr Sydnee Cabal [facility] SURGERY  2002   Dr Ellene Route   PROSTATECTOMY  1998   SHOULDER ARTHROSCOPY     right; 2011; left 1990   SIGMOIDOSCOPY     TONSILLECTOMY AND ADENOIDECTOMY      Social History   Socioeconomic History   Marital status: Married    Spouse name: Not on file   Number of children: Not on file   Years of education: Not on file   Highest education level: Not on file  Occupational History   Occupation: retired  Tobacco Use   Smoking status: Former    Types: Cigarettes    Quit date: 01/31/1985    Years since quitting: 36.2   Smokeless tobacco: Never   Tobacco comments:    smoked  [place], up to 2.5 ppd  Vaping Use   Vaping Use: Never used  Substance and Sexual Activity   Alcohol use: No    Comment:  none since 2012   Drug use: No   Sexual activity: Yes  Other Topics Concern   Not on file  Social History Narrative   Exercises regularly - gym 5 days a week   Social Determinants of Health   Financial Resource Strain: Low Risk    Difficulty of Paying Living Expenses: Not hard at all  Food Insecurity: No Food Insecurity   Worried About Charity fundraiser in the Last Year: Never true   [place] in the Last Year: Never true  Transportation Needs: No Transportation Needs   Lack of Transportation (Medical): No   Lack of Transportation (Non-Medical): No  Physical Activity: Sufficiently Active   Days of Exercise per Week: 5 days   Minutes of Exercise per Session: 30 min  Stress: No Stress Concern Present   Feeling of Stress : Not at all  Social Connections: Socially Integrated   Frequency of Communication with Friends and Family: More than three times a week   Frequency of Social Gatherings with Friends and Family: More than three times a week   Attends Religious Services: More than 4 times per year    Active Member of [employer] or Organizations: Yes   Attends Archivist Meetings: More than 4  times per year   Marital Status: Married  Human resources officer Violence: Not At Risk   Fear of Current or Ex-Partner: No   Emotionally Abused: No   Physically Abused: No   Sexually Abused: No    Family History  Problem Relation Age of Onset   Diabetes Mother    Dementia Mother    Diabetes Sister    Lung cancer Other        maternal uncle & aunt   Healthy Daughter    Colon cancer Neg Hx    Stomach cancer Neg Hx    Arthritis Neg Hx    Heart disease Neg Hx    Stroke Neg Hx    Esophageal cancer Neg Hx    Liver cancer Neg Hx    Pancreatic cancer Neg Hx    Rectal cancer Neg Hx     ROS: no fevers or chills, productive cough, hemoptysis, dysphasia, odynophagia, melena, hematochezia, dysuria, hematuria, rash, seizure activity, orthopnea, PND, pedal edema, claudication. Remaining systems are negative.  Physical Exam: Well-developed well-nourished in no acute distress.  Skin is warm and dry.  HEENT is normal.  Neck is supple.  Chest is clear to auscultation with normal expansion.  Cardiovascular exam is regular rate and rhythm.  Abdominal exam nontender or distended. No masses palpated. Extremities show no edema. neuro grossly intact  ECG- personally reviewed  A/P  1 coronary artery disease-based on elevated calcium score.  Patient denies chest pain.  We will continue medical therapy with aspirin and statin.  2 dilated aortic root-unchanged on most recent CT.  3 PVCs-as outlined above his LV function is normal and he would like to avoid beta-blockade.  He is asymptomatic.  We will follow.    4 History of chronic stage III kidney disease-managed by primary care.  Kirk Ruths, MD

## 2021-04-18 NOTE — Progress Notes (Signed)
? ? ? ? ?Subjective:  ? ? Patient ID: Tom Gaines, male    DOB: [birth year], 76 y.o.   MRN: [id number] ? ?This visit occurred during the SARS-CoV-2 public health emergency.  Safety protocols were in place, including screening questions prior to the visit, additional usage of staff PPE, and extensive cleaning of exam room while observing appropriate contact time as indicated for disinfecting solutions.   ? ? ?HPI ?Tom Gaines is here for follow up of his chronic medical problems, including prediabetes, hld, CKD3b, gout, homocystinemia, lower back pain, b/l shoulder pain ? ?Saw Dr Stanford Breed today.   Crestor increased.   ? ?Still feels tired.  Sleep is refreshing most of the time, but sometimes in the middle of the afternoon he can just lay down and take a nap he just feels exhausted.  He has never had a sleep study. ? ?He has intermittent pressure under his ears-he notes this most when he is going up or down elevation.  At times this does affect his hearing. ? ?He is exercising regularly.    ? ?Medications and allergies reviewed with patient and updated if appropriate. ? ?Current Outpatient Medications on File Prior to Visit  ?Medication Sig Dispense Refill  ? acetaminophen (TYLENOL) 500 MG tablet Take 500 mg by mouth every 6 (six) hours as needed.    ? acyclovir (ZOVIRAX) 800 MG tablet Take 400 mg by mouth 2 (two) times daily.    ? allopurinol (ZYLOPRIM) 300 MG tablet TAKE 1 TABLET BY MOUTH EVERY DAY 90 tablet 1  ? Alpha-Lipoic Acid 600 MG CAPS Take by mouth.    ? Ascorbic Acid (VITAMIN C) 100 MG tablet Take 100 mg by mouth daily.    ? ASHWAGANDHA PO Take 300 mg by mouth 2 (two) times daily.    ? Azelastine HCl 137 MCG/SPRAY SOLN PLACE 1-2 SPRAYS INTO BOTH NOSTRILS 2 (TWO) TIMES DAILY AS NEEDED. USE IN EACH NOSTRIL AS DIRECTED 30 mL 3  ? AZELASTINE HCL OP as needed.    ? B Complex Vitamins (VITAMIN B COMPLEX PO)     ? Bioflavonoid Products (CITRUS BIOFLAVONOIDS PO)     ? BREWERS YEAST PO Take by mouth.    ?  Cholecalciferol (VITAMIN D) 2000 units CAPS Take 1 capsule by mouth daily.    ? co-enzyme Q-10 30 MG capsule Take 100 mg by mouth daily.    ? FOLIC ACID PO 2,440 mcg.    ? L-CITRULLINE PO Take by mouth.    ? LYSINE PO Take 1,000 mg by mouth.    ? medium chain triglycerides (MCT OIL) oil Take by mouth 3 (three) times daily.    ? Multiple Vitamin (MULTIVITAMIN) tablet Take 1 tablet by mouth daily.    ? Omega-3 Fatty Acids (FISH OIL) 1000 MG CAPS Take 1,000 mg by mouth. 1 by mouth daily    ? Pyridoxine HCl (VITAMIN B6) 100 MG TABS     ? RESVERATROL PO Take by mouth. Patient takes bid    ? traMADol (ULTRAM) 50 MG tablet TAKE 1 TABLET (50 MG TOTAL) BY MOUTH DAILY. FOR CHRONIC BACK PAIN 90 tablet 0  ? Turmeric Curcumin 500 MG CAPS Take 500 mg by mouth 3 (three) times daily.     ? Vitamin D-Vitamin K (D3 + K2 DOTS PO)     ? ?No current facility-administered medications on file prior to visit.  ? ? ? ?Review of Systems  ?Constitutional:  Negative for fever.  ?Respiratory:  Negative for cough, shortness  of breath and wheezing.   ?Cardiovascular:  Negative for chest pain, palpitations and leg swelling.  ?Neurological:  Negative for light-headedness and headaches.  ? ?   ?Objective:  ? ?Vitals:  ? 04/19/21 1535  ?BP: 108/78  ?Pulse: 86  ?Temp: 97.7 ?F (36.5 ?C)  ?SpO2: 96%  ? ?BP Readings from Last 3 Encounters:  ?04/19/21 108/78  ?04/19/21 132/76  ?11/25/20 122/80  ? ?Wt Readings from Last 3 Encounters:  ?04/19/21 173 lb 3.2 oz (78.6 kg)  ?04/19/21 173 lb (78.5 kg)  ?11/25/20 178 lb 3.2 oz (80.8 kg)  ? ?Body mass index is 24.16 kg/m?. ? ?  ?Physical Exam ?Constitutional:   ?   General: He is not in acute distress. ?   Appearance: Normal appearance. He is not ill-appearing.  ?HENT:  ?   Head: Normocephalic and atraumatic.  ?Eyes:  ?   Conjunctiva/sclera: Conjunctivae normal.  ?Cardiovascular:  ?   Rate and Rhythm: Normal rate and regular rhythm.  ?   Heart sounds: Normal heart sounds. No murmur heard. ?Pulmonary:  ?   Effort:  Pulmonary effort is normal. No respiratory distress.  ?   Breath sounds: Normal breath sounds. No wheezing or rales.  ?Musculoskeletal:  ?   Right lower leg: No edema.  ?   Left lower leg: No edema.  ?Skin: ?   General: Skin is warm and dry.  ?   Findings: No rash.  ?Neurological:  ?   Mental Status: He is alert. Mental status is at baseline.  ?Psychiatric:     ?   Mood and Affect: Mood normal.  ? ?   ? ?Lab Results  ?Component Value Date  ? WBC 5.4 04/19/2021  ? HGB 14.3 04/19/2021  ? HCT 42.8 04/19/2021  ? PLT 186.0 04/19/2021  ? GLUCOSE 91 04/19/2021  ? CHOL 131 04/19/2021  ? TRIG 59.0 04/19/2021  ? HDL 65.20 04/19/2021  ? LDLDIRECT 83.0 10/09/2019  ? [place] 54 04/19/2021  ? ALT 19 04/19/2021  ? AST 31 04/19/2021  ? NA 142 04/19/2021  ? K 4.4 04/19/2021  ? CL 106 04/19/2021  ? CREATININE 1.66 (H) 04/19/2021  ? BUN 41 (H) 04/19/2021  ? CO2 27 04/19/2021  ? TSH 0.85 04/04/2018  ? PSA 0.60 03/01/2010  ? HGBA1C 5.8 04/19/2021  ? ? ? ?Assessment & Plan:  ? ? ?See Problem List for Assessment and Plan of chronic medical problems.  ? ? ?

## 2021-04-18 NOTE — Patient Instructions (Addendum)
? ? ? ?  Blood work was ordered for your next visit.   ? ? ?Medications changes include :   none ? ? ? ?Return in about 6 months (around 10/20/2021) for follow up. ? ?

## 2021-04-19 ENCOUNTER — Encounter: Payer: Self-pay | Admitting: Cardiology

## 2021-04-19 ENCOUNTER — Ambulatory Visit (INDEPENDENT_AMBULATORY_CARE_PROVIDER_SITE_OTHER): Payer: Medicare Other | Admitting: Cardiology

## 2021-04-19 ENCOUNTER — Encounter: Payer: Self-pay | Admitting: Internal Medicine

## 2021-04-19 ENCOUNTER — Other Ambulatory Visit: Payer: Self-pay

## 2021-04-19 ENCOUNTER — Other Ambulatory Visit (INDEPENDENT_AMBULATORY_CARE_PROVIDER_SITE_OTHER): Payer: Medicare Other

## 2021-04-19 ENCOUNTER — Ambulatory Visit (INDEPENDENT_AMBULATORY_CARE_PROVIDER_SITE_OTHER): Payer: Medicare Other | Admitting: Internal Medicine

## 2021-04-19 ENCOUNTER — Other Ambulatory Visit: Payer: Self-pay | Admitting: Internal Medicine

## 2021-04-19 VITALS — BP 108/78 | HR 86 | Temp 97.7°F | Ht 71.0 in | Wt 173.2 lb

## 2021-04-19 VITALS — BP 132/76 | HR 45 | Ht 71.0 in | Wt 173.0 lb

## 2021-04-19 DIAGNOSIS — I2510 Atherosclerotic heart disease of native coronary artery without angina pectoris: Secondary | ICD-10-CM

## 2021-04-19 DIAGNOSIS — E7800 Pure hypercholesterolemia, unspecified: Secondary | ICD-10-CM | POA: Diagnosis not present

## 2021-04-19 DIAGNOSIS — G8929 Other chronic pain: Secondary | ICD-10-CM | POA: Diagnosis not present

## 2021-04-19 DIAGNOSIS — R5382 Chronic fatigue, unspecified: Secondary | ICD-10-CM | POA: Diagnosis not present

## 2021-04-19 DIAGNOSIS — I77810 Thoracic aortic ectasia: Secondary | ICD-10-CM

## 2021-04-19 DIAGNOSIS — E7211 Homocystinuria: Secondary | ICD-10-CM | POA: Diagnosis not present

## 2021-04-19 DIAGNOSIS — N1832 Chronic kidney disease, stage 3b: Secondary | ICD-10-CM | POA: Diagnosis not present

## 2021-04-19 DIAGNOSIS — E7849 Other hyperlipidemia: Secondary | ICD-10-CM | POA: Diagnosis not present

## 2021-04-19 DIAGNOSIS — M545 Low back pain, unspecified: Secondary | ICD-10-CM

## 2021-04-19 DIAGNOSIS — I493 Ventricular premature depolarization: Secondary | ICD-10-CM

## 2021-04-19 DIAGNOSIS — R7303 Prediabetes: Secondary | ICD-10-CM

## 2021-04-19 DIAGNOSIS — M25511 Pain in right shoulder: Secondary | ICD-10-CM

## 2021-04-19 DIAGNOSIS — Z8739 Personal history of other diseases of the musculoskeletal system and connective tissue: Secondary | ICD-10-CM

## 2021-04-19 DIAGNOSIS — R5383 Other fatigue: Secondary | ICD-10-CM | POA: Insufficient documentation

## 2021-04-19 DIAGNOSIS — M25512 Pain in left shoulder: Secondary | ICD-10-CM | POA: Diagnosis not present

## 2021-04-19 LAB — COMPREHENSIVE METABOLIC PANEL
ALT: 19 U/L (ref 0–53)
AST: 31 U/L (ref 0–37)
Albumin: 4.6 g/dL (ref 3.5–5.2)
Alkaline Phosphatase: 42 U/L (ref 39–117)
BUN: 41 mg/dL — ABNORMAL HIGH (ref 6–23)
CO2: 27 mEq/L (ref 19–32)
Calcium: 10.2 mg/dL (ref 8.4–10.5)
Chloride: 106 mEq/L (ref 96–112)
Creatinine, Ser: 1.66 mg/dL — ABNORMAL HIGH (ref 0.40–1.50)
GFR: 39.94 mL/min — ABNORMAL LOW (ref >60.00)
Glucose, Bld: 91 mg/dL (ref 70–99)
Potassium: 4.4 mEq/L (ref 3.5–5.1)
Sodium: 142 mEq/L (ref 135–145)
Total Bilirubin: 0.5 mg/dL (ref 0.2–1.2)
Total Protein: 6.9 g/dL (ref 6.0–8.3)

## 2021-04-19 LAB — LIPID PANEL
Cholesterol: 131 mg/dL (ref 0–200)
HDL: 65.2 mg/dL (ref >39.00)
LDL Cholesterol: 54 mg/dL (ref 0–99)
NonHDL: 66.07
Total CHOL/HDL Ratio: 2
Triglycerides: 59 mg/dL (ref 0.0–149.0)
VLDL: 11.8 mg/dL (ref 0.0–40.0)

## 2021-04-19 LAB — CBC WITH DIFFERENTIAL/PLATELET
Basophils Absolute: 0 10*3/uL (ref 0.0–0.1)
Basophils Relative: 0.7 % (ref 0.0–3.0)
Eosinophils Absolute: 0.1 10*3/uL (ref 0.0–0.7)
Eosinophils Relative: 2.3 % (ref 0.0–5.0)
HCT: 42.8 % (ref 39.0–52.0)
Hemoglobin: 14.3 g/dL (ref 13.0–17.0)
Lymphocytes Relative: 25.8 % (ref 12.0–46.0)
Lymphs Abs: 1.4 10*3/uL (ref 0.7–4.0)
MCHC: 33.5 g/dL (ref 30.0–36.0)
MCV: 96.6 fl (ref 78.0–100.0)
Monocytes Absolute: 0.6 10*3/uL (ref 0.1–1.0)
Monocytes Relative: 11.5 % (ref 3.0–12.0)
Neutro Abs: 3.2 10*3/uL (ref 1.4–7.7)
Neutrophils Relative %: 59.7 % (ref 43.0–77.0)
Platelets: 186 10*3/uL (ref 150.0–400.0)
RBC: 4.43 Mil/uL (ref 4.22–5.81)
RDW: 15.7 % — ABNORMAL HIGH (ref 11.5–15.5)
WBC: 5.4 10*3/uL (ref 4.0–10.5)

## 2021-04-19 LAB — FOLATE: Folate: >24.2 ng/mL (ref >5.9)

## 2021-04-19 LAB — VITAMIN B12: Vitamin B-12: 556 pg/mL (ref 211–911)

## 2021-04-19 LAB — HEMOGLOBIN A1C: Hgb A1c MFr Bld: 5.8 % (ref 4.6–6.5)

## 2021-04-19 LAB — URIC ACID: Uric Acid, Serum: 4.5 mg/dL (ref 4.0–7.8)

## 2021-04-19 MED ORDER — ROSUVASTATIN CALCIUM 40 MG PO TABS: 40.0000 mg | ORAL_TABLET | Freq: Every day | ORAL | 3 refills | Status: DC

## 2021-04-19 NOTE — Assessment & Plan Note (Addendum)
Chronic ?No episodes of gout ?Continue allopurinol 300 mg daily ?Uric acid level well controlled ?

## 2021-04-19 NOTE — Assessment & Plan Note (Signed)
Chronic ?Doing regular exercises for his back ?Overall pain controlled ?Takes tramadol 50 mg daily as needed ?

## 2021-04-19 NOTE — Assessment & Plan Note (Signed)
Chronic ?Following with orthopedics ?Takes Tylenol as needed ?Okay to take tramadol as needed for more severe pain, which she does not take often ?

## 2021-04-19 NOTE — Assessment & Plan Note (Addendum)
Chronic ?Following with nephrology ?CMP overall stable, but slightly worse than 6 months ago ?

## 2021-04-19 NOTE — Assessment & Plan Note (Addendum)
Chronic ?Lab Results  ?Component Value Date  ? HGBA1C 5.8 04/19/2021  ? ? ?Low sugar / carb diet ?Stressed regular exercise ?

## 2021-04-19 NOTE — Assessment & Plan Note (Addendum)
Chronic ?Continue folate, B12 and B6 supplementation ?Check folic acid level, S43 level, homocystine level-homocystine level pending, other levels are good ?

## 2021-04-19 NOTE — Patient Instructions (Signed)
Medication Instructions:  ? ?INCREASE ROSUVASTATIN TO 40 MG ONCE DAILY=2 OF THE 20 MG TABLETS ONCE DAILY ? ?STOP FENOFIBRATE ? ?*If you need a refill on your cardiac medications before your next appointment, please call your pharmacy* ? ? ?Lab Work: ? ?Your physician recommends that you return for lab work in: [place] ? ?If you have labs (blood work) drawn today and your tests are completely normal, you will receive your results only by: ?MyChart Message (if you have MyChart) OR ?A paper copy in the mail ?If you have any lab test that is abnormal or we need to change your treatment, we will call you to review the results. ? ? ?Follow-Up: ?At [facility], you and your health needs are our priority.  As part of our continuing mission to provide you with exceptional heart care, we have created designated Provider Care Teams.  These Care Teams include your primary Cardiologist (physician) and Advanced Practice Providers (APPs -  Physician Assistants and Nurse Practitioners) who all work together to provide you with the care you need, when you need it. ? ?We recommend signing up for the patient portal called "MyChart".  Sign up information is provided on this After Visit Summary.  MyChart is used to connect with patients for Virtual Visits (Telemedicine).  Patients are able to view lab/test results, encounter notes, upcoming appointments, etc.  Non-urgent messages can be sent to your provider as well.   ?To learn more about what you can do with MyChart, go to [email or web address].   ? ?Your next appointment:   ?6 month(s) ? ?The format for your next appointment:   ?In Person ? ?Provider:   ?Kirk Ruths, MD   ? ? ? ?

## 2021-04-19 NOTE — Assessment & Plan Note (Signed)
Chronic ?Experiences fatigue neck significant mid afternoon ?No explanation by blood work ?Exercising regularly ?States sleep is refreshing ?Discussed possible sleep study-he deferred for now, but can consider in the future to rule out OSA ?

## 2021-04-19 NOTE — Assessment & Plan Note (Addendum)
Chronic ?Regular exercise and healthy diet encouraged ?Check lipid panel  ?Continue Crestor-dose just increased to 40 mg by cardiology-coronary calcium score 320 ?

## 2021-04-20 ENCOUNTER — Ambulatory Visit: Payer: Medicare Other | Admitting: Gastroenterology

## 2021-04-20 DIAGNOSIS — C61 Malignant neoplasm of prostate: Secondary | ICD-10-CM | POA: Diagnosis not present

## 2021-04-20 DIAGNOSIS — R972 Elevated prostate specific antigen [PSA]: Secondary | ICD-10-CM | POA: Diagnosis not present

## 2021-04-21 LAB — HOMOCYSTEINE: Homocysteine: 14.8 umol/L — ABNORMAL HIGH (ref <11.4)

## 2021-04-27 DIAGNOSIS — N1831 Chronic kidney disease, stage 3a: Secondary | ICD-10-CM | POA: Diagnosis not present

## 2021-04-27 DIAGNOSIS — M109 Gout, unspecified: Secondary | ICD-10-CM | POA: Diagnosis not present

## 2021-04-27 DIAGNOSIS — Z20822 Contact with and (suspected) exposure to covid-19: Secondary | ICD-10-CM | POA: Diagnosis not present

## 2021-04-27 DIAGNOSIS — I77810 Thoracic aortic ectasia: Secondary | ICD-10-CM | POA: Diagnosis not present

## 2021-04-27 DIAGNOSIS — E785 Hyperlipidemia, unspecified: Secondary | ICD-10-CM | POA: Diagnosis not present

## 2021-04-27 DIAGNOSIS — R7303 Prediabetes: Secondary | ICD-10-CM | POA: Diagnosis not present

## 2021-04-27 DIAGNOSIS — E7211 Homocystinuria: Secondary | ICD-10-CM | POA: Diagnosis not present

## 2021-04-27 DIAGNOSIS — I5189 Other ill-defined heart diseases: Secondary | ICD-10-CM | POA: Diagnosis not present

## 2021-04-27 DIAGNOSIS — R5383 Other fatigue: Secondary | ICD-10-CM | POA: Diagnosis not present

## 2021-04-27 DIAGNOSIS — N2581 Secondary hyperparathyroidism of renal origin: Secondary | ICD-10-CM | POA: Diagnosis not present

## 2021-04-29 DIAGNOSIS — C61 Malignant neoplasm of prostate: Secondary | ICD-10-CM | POA: Diagnosis not present

## 2021-04-29 DIAGNOSIS — R972 Elevated prostate specific antigen [PSA]: Secondary | ICD-10-CM | POA: Diagnosis not present

## 2021-04-29 DIAGNOSIS — N529 Male erectile dysfunction, unspecified: Secondary | ICD-10-CM | POA: Diagnosis not present

## 2021-05-02 DIAGNOSIS — K2270 Barrett's esophagus without dysplasia: Secondary | ICD-10-CM | POA: Insufficient documentation

## 2021-05-07 ENCOUNTER — Encounter: Payer: Self-pay | Admitting: Internal Medicine

## 2021-05-07 DIAGNOSIS — R5383 Other fatigue: Secondary | ICD-10-CM

## 2021-05-08 DIAGNOSIS — Z20822 Contact with and (suspected) exposure to covid-19: Secondary | ICD-10-CM | POA: Diagnosis not present

## 2021-05-10 DIAGNOSIS — Z20822 Contact with and (suspected) exposure to covid-19: Secondary | ICD-10-CM | POA: Diagnosis not present

## 2021-05-19 ENCOUNTER — Ambulatory Visit (INDEPENDENT_AMBULATORY_CARE_PROVIDER_SITE_OTHER): Payer: Medicare Other | Admitting: Gastroenterology

## 2021-05-19 ENCOUNTER — Encounter: Payer: Self-pay | Admitting: Gastroenterology

## 2021-05-19 VITALS — BP 126/66 | HR 60 | Ht 71.0 in | Wt 175.0 lb

## 2021-05-19 DIAGNOSIS — I2510 Atherosclerotic heart disease of native coronary artery without angina pectoris: Secondary | ICD-10-CM

## 2021-05-19 DIAGNOSIS — R1310 Dysphagia, unspecified: Secondary | ICD-10-CM

## 2021-05-19 NOTE — Patient Instructions (Signed)
You have been scheduled for a Barium Esophogram at [facility] (1st floor of the hospital) on 05/27/21 at 10:30am. Please arrive 15 minutes prior to your appointment for registration. Make certain not to have anything to eat or drink 3 hours prior to your test. If you need to reschedule for any reason, please contact radiology at [phone] to do so. ?___________________________________________________________ ?A barium swallow is an examination that concentrates on views of the esophagus. This tends to be a double contrast exam (barium and two liquids which, when combined, create a gas to distend the wall of the oesophagus) or single contrast (non-ionic iodine based). The study is usually tailored to your symptoms so a good history is essential. Attention is paid during the study to the form, structure and configuration of the esophagus, looking for functional disorders (such as aspiration, dysphagia, achalasia, motility and reflux) ?EXAMINATION ?You may be asked to change into a gown, depending on the type of swallow being performed. A radiologist and radiographer will perform the procedure. The radiologist will advise you of the ?type of contrast selected for your procedure and direct you during the exam. You will be asked to stand, sit or lie in several different positions and to hold a small amount of fluid in your mouth before being asked to swallow while the imaging is performed .In some instances you may be asked to swallow barium coated marshmallows to assess the motility of a solid food bolus. ?The exam can be recorded as a digital or video fluoroscopy procedure. ?POST PROCEDURE ?It will take 1-2 days for the barium to pass through your system. To facilitate this, it is important, unless otherwise directed, to increase your fluids for the next 24-48hrs and to resume your normal diet.  ?This test typically takes about 30 minutes to  perform. ?________________________________________________________________. ? ?You have been scheduled for an endoscopy. Please follow written instructions given to you at your visit today. ?If you use inhalers (even only as needed), please bring them with you on the day of your procedure. ? ?The [place] [facility] providers would like to encourage you to use [facility] to communicate with providers for non-urgent requests or questions.  Due to long hold times on the telephone, sending your provider a message by [facility] may be a faster and more efficient way to get a response.  Please allow 48 business hours for a response.  Please remember that this is for non-urgent requests.  ? ?Due to recent changes in healthcare laws, you may see the results of your imaging and laboratory studies on MyChart before your provider has had a chance to review them.  We understand that in some cases there may be results that are confusing or concerning to you. Not all laboratory results come back in the same time frame and the provider may be waiting for multiple results in order to interpret others.  Please give [place] 48 hours in order for your provider to thoroughly review all the results before contacting the office for clarification of your results.  ? ?Thank you for choosing me and [facility]. ? ?Malcolm T. Dagoberto Ligas., MD., Marval Regal ? ? ?

## 2021-05-19 NOTE — Progress Notes (Signed)
? ? ?History of Present Illness: This is a 76 year old male referred by Tom Rail, MD for the evaluation of dysphagia.  He relates once every couple months he will have difficulty swallowing chicken.  At other times he has no difficulties with solids.  No liquid dysphagia.  He relates heartburn occurring about once every 6 months.  No other gastrointestinal complaints.  Denies weight loss, abdominal pain, constipation, diarrhea, change in stool caliber, melena, hematochezia, nausea, vomiting, chest pain.  ? ? ?No Known Allergies ?Outpatient Medications Prior to Visit  ?Medication Sig Dispense Refill  ? acetaminophen (TYLENOL) 500 MG tablet Take 500 mg by mouth every 6 (six) hours as needed.    ? acyclovir (ZOVIRAX) 800 MG tablet Take 400 mg by mouth 2 (two) times daily.    ? allopurinol (ZYLOPRIM) 300 MG tablet TAKE 1 TABLET BY MOUTH EVERY DAY 90 tablet 1  ? Alpha-Lipoic Acid 600 MG CAPS Take by mouth.    ? Ascorbic Acid (VITAMIN C) 100 MG tablet Take 100 mg by mouth daily.    ? ASHWAGANDHA PO Take 300 mg by mouth 2 (two) times daily.    ? ASPIRIN 81 PO Take 1 tablet by mouth daily.    ? Azelastine HCl 137 MCG/SPRAY SOLN PLACE 1-2 SPRAYS INTO BOTH NOSTRILS 2 (TWO) TIMES DAILY AS NEEDED. USE IN EACH NOSTRIL AS DIRECTED 30 mL 3  ? B Complex Vitamins (VITAMIN B COMPLEX PO)     ? Bioflavonoid Products (CITRUS BIOFLAVONOIDS PO)     ? Cholecalciferol (VITAMIN D) 2000 units CAPS Take 1 capsule by mouth daily.    ? co-enzyme Q-10 30 MG capsule Take 100 mg by mouth daily.    ? FOLIC ACID PO 6,283 mcg.    ? GLUCOSAMINE-CHONDROITIN PO Take 300 mg by mouth 3 (three) times daily.    ? GLYCINE PO Take 1,000 mg by mouth 3 (three) times daily.    ? L-CITRULLINE PO Take by mouth.    ? LYSINE PO Take 1,000 mg by mouth.    ? medium chain triglycerides (MCT OIL) oil Take by mouth 3 (three) times daily.    ? Multiple Vitamin (MULTIVITAMIN) tablet Take 1 tablet by mouth daily.    ? Omega-3 Fatty Acids (FISH OIL) 1000 MG CAPS Take  1,000 mg by mouth. 1 by mouth daily    ? Pyridoxine HCl (VITAMIN B6) 100 MG TABS     ? RESVERATROL PO Take by mouth. Patient takes bid    ? rosuvastatin (CRESTOR) 40 MG tablet Take 1 tablet (40 mg total) by mouth daily. 90 tablet 3  ? traMADol (ULTRAM) 50 MG tablet TAKE 1 TABLET (50 MG TOTAL) BY MOUTH DAILY. FOR CHRONIC BACK PAIN (Patient taking differently: Take 50 mg by mouth as needed. For chronic back pain) 90 tablet 0  ? Turmeric Curcumin 500 MG CAPS Take 500 mg by mouth 3 (three) times daily.     ? Vitamin D-Vitamin K (D3 + K2 DOTS PO)     ? AZELASTINE HCL OP as needed.    ? BREWERS YEAST PO Take by mouth.    ? ?No facility-administered medications prior to visit.  ? ?Past Medical History:  ?Diagnosis Date  ? Allergy   ? Arthritis   ? 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 Echo 10/11/2018 40 mm  ? Basal cell carcinoma 02/13/2013  ? 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  Bilateral shoulder bursitis 02/01/2017  ? Bilateral injections February 01, 2017  ? Cataract   ? CKD (chronic kidney disease) 03/31/2017  ? Diastolic dysfunction 2/87/8676  ? Echo 10/11/18  ? History of colonic polyps 03/12/2008  ? Qualifier: Diagnosis of  By: Linna Darner MD, Gwyndolyn Saxon   02/06/2012  Sessile polyps (2)    ? History of gout 03/12/2008  ? R great toe   ? 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 Hyperlipidemia 03/12/2008  ? NMR LipoProfile 2004: LDL 126 (total particle 1084/small dense particle #80), HDL 73.5. LDL goal equal less than 160, ideally less than 130. No FH MI or CVA  ? Low back pain 03/13/2014  ? Chronic, has seen Dr Tamala Julian and Dr Lynann Bologna Taking tramadol as needed  ? Nonallopathic lesion of lumbosacral region 12/01/2015  ? Nonallopathic lesion of sacral region 12/01/2015  ? Nonallopathic lesion of thoracic region 12/01/2015  ? Prediabetes 03/29/2016  ? Mom, sister DM  ? PROSTATE CANCER, HX OF 03/12/2008  ? Qualifier: Diagnosis of  By: Linna Darner MD, [facility]   Radical  prostatectomy 1998, Urologist Dr. Lawerance Bach   ? ?Past Surgical History:  ?Procedure Laterality Date  ? CATARACT EXTRACTION, BILATERAL  2017  ? colonoscopy with polypectomy  2014  ? Dr Fuller Plan  ? [place] SURGERY  2002  ? Dr Ellene Route  ? PROSTATECTOMY  1998  ? SHOULDER ARTHROSCOPY    ? right; 2011; left 1990  ? SIGMOIDOSCOPY    ? TONSILLECTOMY AND ADENOIDECTOMY    ? ?Social History  ? ?Socioeconomic History  ? Marital status: Married  ?  Spouse name: Not on file  ? Number of children: Not on file  ? Years of education: Not on file  ? Highest education level: Not on file  ?Occupational History  ? Occupation: retired  ?Tobacco Use  ? Smoking status: Former  ?  Types: Cigarettes  ?  Quit date: 01/31/1985  ?  Years since quitting: 36.3  ? Smokeless tobacco: Never  ? Tobacco comments:  ?  smoked  [place], up to 2.5 ppd  ?Vaping Use  ? Vaping Use: Never used  ?Substance and Sexual Activity  ? Alcohol use: No  ?  Comment:  none since 2012  ? Drug use: No  ? Sexual activity: Yes  ?Other Topics Concern  ? Not on file  ?Social History Narrative  ? Exercises regularly - gym 5 days a week  ? ?Social Determinants of Health  ? ?Financial Resource Strain: Low Risk   ? Difficulty of Paying Living Expenses: Not hard at all  ?Food Insecurity: No Food Insecurity  ? Worried About Charity fundraiser in the Last Year: Never true  ? Ran Out of Food in the Last Year: Never true  ?Transportation Needs: No Transportation Needs  ? Lack of Transportation (Medical): No  ? Lack of Transportation (Non-Medical): No  ?Physical Activity: Sufficiently Active  ? Days of Exercise per Week: 5 days  ? Minutes of Exercise per Session: 30 min  ?Stress: No Stress Concern Present  ? Feeling of Stress : Not at all  ?Social Connections: Socially Integrated  ? Frequency of Communication with Friends and Family: More than three times a week  ? Frequency of Social Gatherings with Friends and Family: More than three times a week  ? Attends Religious Services:  More than 4 times per year  ? Active Member of Clubs or Organizations: Yes  ? Attends Archivist Meetings: More than 4 times per  year  ? Marital Status: Married  ? ?Family History  ?Problem Relation Age of Onset  ? Diabetes Mother   ? Dementia Mother   ? Diabetes Sister   ? Lung cancer Other   ?     maternal uncle & aunt  ? Healthy Daughter   ? Colon cancer Neg Hx   ? Stomach cancer Neg Hx   ? Arthritis Neg Hx   ? Heart disease Neg Hx   ? Stroke Neg Hx   ? Esophageal cancer Neg Hx   ? Liver cancer Neg Hx   ? Pancreatic cancer Neg Hx   ? Rectal cancer Neg Hx   ? ?   ? ?Review of Systems: Pertinent positive and negative review of systems were noted in the above HPI section. All other review of systems were otherwise negative. ? ? ? ?Physical Exam: ?General: Well developed, well nourished, no acute distress ?Head: Normocephalic and atraumatic ?Eyes: Sclerae anicteric, EOMI ?Ears: Normal auditory acuity ?Mouth: Not examined, mask on during Covid-19 pandemic ?Neck: Supple, no masses or thyromegaly ?Lungs: Clear throughout to auscultation ?Heart: Regular rate and rhythm; no murmurs, rubs or bruits ?Abdomen: Soft, non tender and non distended. No masses, hepatosplenomegaly or hernias noted. Normal Bowel sounds ?Rectal: Not done ?Musculoskeletal: Symmetrical with no gross deformities  ?Skin: No lesions on visible extremities ?Pulses:  Normal pulses noted ?Extremities: No clubbing, cyanosis, edema or deformities noted ?Neurological: Alert oriented x 4, grossly nonfocal ?Cervical Nodes:  No significant cervical adenopathy ?Inguinal Nodes: No significant inguinal adenopathy ?Psychological:  Alert and cooperative. Normal mood and affect ? ? ?Assessment and Recommendations: ? ?Intermittent solid food dysphagia.  Infrequent episodes of heartburn.  Rule out esophageal stricture, esophageal dysmotility, GERD and other disorders..  Schedule barium esophagram and EGD. The risks (including bleeding, perforation, infection,  missed lesions, medication reactions and possible hospitalization or surgery if complications occur), benefits, and alternatives to endoscopy with possible biopsy and possible dilation were discussed with the pati

## 2021-05-25 ENCOUNTER — Other Ambulatory Visit: Payer: Self-pay | Admitting: Internal Medicine

## 2021-05-26 DIAGNOSIS — Z20822 Contact with and (suspected) exposure to covid-19: Secondary | ICD-10-CM | POA: Diagnosis not present

## 2021-05-27 ENCOUNTER — Other Ambulatory Visit: Payer: Self-pay

## 2021-05-27 ENCOUNTER — Ambulatory Visit (HOSPITAL_COMMUNITY)
Admission: RE | Admit: 2021-05-27 | Discharge: 2021-05-27 | Disposition: A | Discharge status: Home / Self Care | Payer: Medicare Other | Source: Ambulatory Visit | Attending: Gastroenterology | Admitting: Gastroenterology

## 2021-05-27 DIAGNOSIS — K219 Gastro-esophageal reflux disease without esophagitis: Secondary | ICD-10-CM | POA: Diagnosis not present

## 2021-05-27 DIAGNOSIS — R1310 Dysphagia, unspecified: Secondary | ICD-10-CM | POA: Diagnosis not present

## 2021-05-27 DIAGNOSIS — Z20822 Contact with and (suspected) exposure to covid-19: Secondary | ICD-10-CM | POA: Diagnosis not present

## 2021-05-27 MED ORDER — PANTOPRAZOLE SODIUM 40 MG PO TBEC: 40.0000 mg | DELAYED_RELEASE_TABLET | Freq: Every morning | ORAL | 11 refills | Status: DC

## 2021-06-01 DIAGNOSIS — Z20822 Contact with and (suspected) exposure to covid-19: Secondary | ICD-10-CM | POA: Diagnosis not present

## 2021-06-03 DIAGNOSIS — Z20822 Contact with and (suspected) exposure to covid-19: Secondary | ICD-10-CM | POA: Diagnosis not present

## 2021-06-14 DIAGNOSIS — E7800 Pure hypercholesterolemia, unspecified: Secondary | ICD-10-CM | POA: Diagnosis not present

## 2021-06-15 LAB — LIPID PANEL
Chol/HDL Ratio: 2 ratio (ref 0.0–5.0)
Cholesterol, Total: 115 mg/dL (ref 100–199)
HDL: 57 mg/dL (ref >39)
LDL Chol Calc (NIH): 45 mg/dL (ref 0–99)
Triglycerides: 59 mg/dL (ref 0–149)
VLDL Cholesterol Cal: 13 mg/dL (ref 5–40)

## 2021-06-15 LAB — HEPATIC FUNCTION PANEL
ALT: 32 IU/L (ref 0–44)
AST: 46 IU/L — ABNORMAL HIGH (ref 0–40)
Albumin: 4.4 g/dL (ref 3.7–4.7)
Alkaline Phosphatase: 77 IU/L (ref 44–121)
Bilirubin Total: 0.4 mg/dL (ref 0.0–1.2)
Bilirubin, Direct: 0.17 mg/dL (ref 0.00–0.40)
Total Protein: 6.6 g/dL (ref 6.0–8.5)

## 2021-06-16 DIAGNOSIS — L570 Actinic keratosis: Secondary | ICD-10-CM | POA: Diagnosis not present

## 2021-06-16 DIAGNOSIS — Z85828 Personal history of other malignant neoplasm of skin: Secondary | ICD-10-CM | POA: Diagnosis not present

## 2021-06-16 DIAGNOSIS — L821 Other seborrheic keratosis: Secondary | ICD-10-CM | POA: Diagnosis not present

## 2021-06-16 DIAGNOSIS — D0421 Carcinoma in situ of skin of right ear and external auricular canal: Secondary | ICD-10-CM | POA: Diagnosis not present

## 2021-06-16 DIAGNOSIS — D485 Neoplasm of uncertain behavior of skin: Secondary | ICD-10-CM | POA: Diagnosis not present

## 2021-06-16 DIAGNOSIS — L814 Other melanin hyperpigmentation: Secondary | ICD-10-CM | POA: Diagnosis not present

## 2021-06-23 ENCOUNTER — Encounter: Payer: Self-pay | Admitting: Gastroenterology

## 2021-06-23 ENCOUNTER — Ambulatory Visit (AMBULATORY_SURGERY_CENTER): Payer: Medicare Other | Admitting: Gastroenterology

## 2021-06-23 VITALS — BP 122/76 | HR 52 | Temp 98.6°F | Resp 20 | Ht 71.0 in | Wt 175.0 lb

## 2021-06-23 DIAGNOSIS — K319 Disease of stomach and duodenum, unspecified: Secondary | ICD-10-CM

## 2021-06-23 DIAGNOSIS — R1310 Dysphagia, unspecified: Secondary | ICD-10-CM

## 2021-06-23 DIAGNOSIS — R933 Abnormal findings on diagnostic imaging of other parts of digestive tract: Secondary | ICD-10-CM

## 2021-06-23 DIAGNOSIS — R12 Heartburn: Secondary | ICD-10-CM | POA: Diagnosis not present

## 2021-06-23 DIAGNOSIS — K229 Disease of esophagus, unspecified: Secondary | ICD-10-CM

## 2021-06-23 DIAGNOSIS — K2950 Unspecified chronic gastritis without bleeding: Secondary | ICD-10-CM | POA: Diagnosis not present

## 2021-06-23 DIAGNOSIS — K449 Diaphragmatic hernia without obstruction or gangrene: Secondary | ICD-10-CM | POA: Diagnosis not present

## 2021-06-23 DIAGNOSIS — K2270 Barrett's esophagus without dysplasia: Secondary | ICD-10-CM | POA: Diagnosis not present

## 2021-06-23 MED ORDER — SODIUM CHLORIDE 0.9 % IV SOLN: 500.0000 mL | Freq: Once | INTRAVENOUS | Status: DC

## 2021-06-23 NOTE — Op Note (Signed)
[place] Patient Name: Tom Gaines Procedure Date: 06/23/2021 4:01 PM MRN: [id number] Endoscopist: Ladene Artist , MD Age: 76 Referring MD:  Date of Birth: [birth year] Gender: Male Account #: [id number] Procedure:                Upper GI endoscopy Indications:              Dysphagia, Heartburn, Abnormal esophagram Medicines:                Monitored Anesthesia Care Procedure:                Pre-Anesthesia Assessment:                           - Prior to the procedure, a History and Physical                            was performed, and patient medications and                            allergies were reviewed. The patient's tolerance of                            previous anesthesia was also reviewed. The risks                            and benefits of the procedure and the sedation                            options and risks were discussed with the patient.                            All questions were answered, and informed consent                            was obtained. Prior Anticoagulants: The patient has                            taken no previous anticoagulant or antiplatelet                            agents. ASA Grade Assessment: II - A patient with                            mild systemic disease. After reviewing the risks                            and benefits, the patient was deemed in                            satisfactory condition to undergo the procedure.                           After obtaining informed consent, the endoscope was  passed under direct vision. Throughout the                            procedure, the patient's blood pressure, pulse, and                            oxygen saturations were monitored continuously. The                            Endoscope was introduced through the mouth, and                            advanced to the second part of duodenum. The upper                            GI endoscopy was  accomplished without difficulty.                            The patient tolerated the procedure well. Scope In: Scope Out: Findings:                 The Z-line was variable, 1-2 cm, and was found at                            the gastroesophageal junction. Biopsies were taken                            with a cold forceps for histology.                           No endoscopic abnormality was evident in the                            esophagus to explain the patient's complaint of                            dysphagia. It was decided, however, to proceed with                            dilation of the entire esophagus. A guidewire was                            placed and the scope was withdrawn. Dilation was                            performed with a Savary dilator with no resistance                            at 17 mm.                           Patchy mildly erythematous mucosa without bleeding  was found in the gastric antrum. Biopsies were                            taken with a cold forceps for histology.                           A small hiatal hernia was present.                           The exam of the stomach was otherwise normal.                           The duodenal bulb and second portion of the                            duodenum were normal. Complications:            No immediate complications. Estimated Blood Loss:     Estimated blood loss was minimal. Impression:               - Z-line variable at the gastroesophageal junction.                            Biopsied.                           - No endoscopic esophageal abnormality to explain                            patient's dysphagia. Esophagus dilated.                           - Erythematous mucosa in the antrum. Biopsied.                           - Small hiatal hernia.                           - Normal duodenal bulb and second portion of the                             duodenum. Recommendation:           - Patient has a contact number available for                            emergencies. The signs and symptoms of potential                            delayed complications were discussed with the                            patient. Return to normal activities tomorrow.                            Written discharge instructions were provided to the  patient.                           - Clear liquid diet for 2 hours, then advance as                            tolerated to soft diet today.                           - Resume prior diet tomorrow.                           - Follow antireflux measures.                           - Continue present medications.                           - Await pathology results. Ladene Artist, MD 06/23/2021 4:33:19 PM This report has been signed electronically.

## 2021-06-23 NOTE — Progress Notes (Signed)
Vs by DT in adm 

## 2021-06-23 NOTE — Progress Notes (Signed)
History of Present Illness: This is a 76 year old male here for the evaluation of infrequent solid food dysphagia, infrequent heartburn and an abnormal esophagram with EGD.      No Known Allergies       Outpatient Medications Prior to Visit  Medication Sig Dispense Refill   acetaminophen (TYLENOL) 500 MG tablet Take 500 mg by mouth every 6 (six) hours as needed.       acyclovir (ZOVIRAX) 800 MG tablet Take 400 mg by mouth 2 (two) times daily.       allopurinol (ZYLOPRIM) 300 MG tablet TAKE 1 TABLET BY MOUTH EVERY DAY 90 tablet 1   Alpha-Lipoic Acid 600 MG CAPS Take by mouth.       Ascorbic Acid (VITAMIN C) 100 MG tablet Take 100 mg by mouth daily.       ASHWAGANDHA PO Take 300 mg by mouth 2 (two) times daily.       ASPIRIN 81 PO Take 1 tablet by mouth daily.       Azelastine HCl 137 MCG/SPRAY SOLN PLACE 1-2 SPRAYS INTO BOTH NOSTRILS 2 (TWO) TIMES DAILY AS NEEDED. USE IN EACH NOSTRIL AS DIRECTED 30 mL 3   B Complex Vitamins (VITAMIN B COMPLEX PO)         Bioflavonoid Products (CITRUS BIOFLAVONOIDS PO)         Cholecalciferol (VITAMIN D) 2000 units CAPS Take 1 capsule by mouth daily.       co-enzyme Q-10 30 MG capsule Take 100 mg by mouth daily.       FOLIC ACID PO 8,144 mcg.       GLUCOSAMINE-CHONDROITIN PO Take 300 mg by mouth 3 (three) times daily.       GLYCINE PO Take 1,000 mg by mouth 3 (three) times daily.       L-CITRULLINE PO Take by mouth.       LYSINE PO Take 1,000 mg by mouth.       medium chain triglycerides (MCT OIL) oil Take by mouth 3 (three) times daily.       Multiple Vitamin (MULTIVITAMIN) tablet Take 1 tablet by mouth daily.       Omega-3 Fatty Acids (FISH OIL) 1000 MG CAPS Take 1,000 mg by mouth. 1 by mouth daily       Pyridoxine HCl (VITAMIN B6) 100 MG TABS         RESVERATROL PO Take by mouth. Patient takes bid       rosuvastatin (CRESTOR) 40 MG tablet Take 1 tablet (40 mg total) by mouth daily. 90 tablet 3   traMADol (ULTRAM) 50 MG tablet TAKE 1 TABLET (50 MG TOTAL)  BY MOUTH DAILY. FOR CHRONIC BACK PAIN (Patient taking differently: Take 50 mg by mouth as needed. For chronic back pain) 90 tablet 0   Turmeric Curcumin 500 MG CAPS Take 500 mg by mouth 3 (three) times daily.        Vitamin D-Vitamin K (D3 + K2 DOTS PO)         AZELASTINE HCL OP as needed.       BREWERS YEAST PO Take by mouth.        No facility-administered medications prior to visit.        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 LDL goal equal less than 160, ideally less than 130. No FH MI or CVA   Low back pain 03/13/2014    Chronic, has seen Dr Tamala Julian and Dr Lynann Bologna Taking tramadol as needed   Nonallopathic lesion of lumbosacral region 12/01/2015   Nonallopathic lesion of sacral region 12/01/2015   Nonallopathic lesion of thoracic region 12/01/2015   Prediabetes 03/29/2016    Mom, sister DM   PROSTATE CANCER, HX OF 03/12/2008    Qualifier: Diagnosis of  By: Linna Darner MD, [facility]   Radical prostatectomy 1998, Urologist Dr. Lawerance Bach          Past Surgical History:  Procedure Laterality Date   CATARACT EXTRACTION, BILATERAL   2017   colonoscopy with polypectomy   2014    Dr Sydnee Cabal [facility] SURGERY   2002    Dr Ellene Route   PROSTATECTOMY   1998    SHOULDER ARTHROSCOPY        right; 2011; left 1990   SIGMOIDOSCOPY       TONSILLECTOMY AND ADENOIDECTOMY        Social History         Socioeconomic History   Marital status: Married      Spouse name: Not on file   Number of children: Not on file   Years of education: Not on file   Highest education level: Not on file  Occupational History   Occupation: retired  Tobacco Use   Smoking status: Former      Types: Cigarettes      Quit date: 01/31/1985      Years since quitting: 36.3   Smokeless tobacco: Never   Tobacco comments:      smoked  [place], up to 2.5 ppd  Vaping Use   Vaping Use: Never used  Substance and Sexual Activity   Alcohol use: No      Comment:  none since 2012   Drug use: No   Sexual activity: Yes  Other Topics Concern   Not on file  Social History Narrative    Exercises regularly - gym 5 days a week    Social Determinants of Health       Financial Resource Strain: Low Risk    Difficulty of Paying Living Expenses: Not hard at all  Food Insecurity: No Food Insecurity   Worried About Charity fundraiser in the Last Year: Never true   [place] in the Last Year: Never true  Transportation Needs: No Transportation Needs   Lack of Transportation (Medical): No   Lack of Transportation (Non-Medical): No  Physical Activity: Sufficiently Active   Days of Exercise per Week: 5 days   Minutes of Exercise per Session: 30 min  Stress: No Stress Concern Present   Feeling of Stress : Not at all  Social Connections: Engineer, building services of Communication with Friends and Family: More than three times a week   Frequency of Social Gatherings with Friends and Family: More than three times a week   Attends Religious Services: More than 4 times per year   Active Member of [employer] or Organizations: Yes   Attends Music therapist: More than 4 times per year   Marital Status: Married         Family History  Problem Relation Age of  Onset   Diabetes Mother     Dementia Mother     Diabetes Sister     Lung cancer Other          maternal uncle & aunt   Healthy Daughter     Colon cancer Neg Hx     Stomach cancer Neg Hx     Arthritis Neg Hx     Heart disease Neg Hx     Stroke Neg Hx     Esophageal cancer Neg Hx     Liver cancer Neg Hx     Pancreatic cancer Neg Hx     Rectal cancer Neg Hx           Review of Systems: Pertinent positive and negative review of systems were noted in the above HPI section. All other review of systems were otherwise negative.       Physical Exam: General: Well developed, well nourished, no acute distress Head: Normocephalic and atraumatic Eyes: Sclerae anicteric, EOMI Ears: Normal auditory acuity Mouth: Not examined, mask on during Covid-19 pandemic Neck: Supple, no masses or thyromegaly Lungs: Clear throughout to auscultation Heart: Regular rate and rhythm; no murmurs, rubs or bruits Abdomen: Soft, non tender and non distended. No masses, hepatosplenomegaly or hernias noted. Normal Bowel sounds Rectal: Not done Musculoskeletal: Symmetrical with no gross deformities  Skin: No lesions on visible extremities Pulses:  Normal pulses noted Extremities: No clubbing, cyanosis, edema or deformities noted Neurological: Alert oriented x 4, grossly nonfocal Cervical Nodes:  No significant cervical adenopathy Inguinal Nodes: No significant inguinal adenopathy Psychological:  Alert and cooperative. Normal mood and affect     Assessment and Recommendations:   Intermittent solid food dysphagia, infrequent episodes of heartburn, abnormal esophagram for EGD.

## 2021-06-23 NOTE — Patient Instructions (Signed)
Handouts Provided:  Post Dilation Diet  YOU HAD AN ENDOSCOPIC PROCEDURE TODAY AT [facility]:   Refer to the procedure report that was given to you for any specific questions about what was found during the examination.  If the procedure report does not answer your questions, please call your gastroenterologist to clarify.  If you requested that your care partner not be given the details of your procedure findings, then the procedure report has been included in a sealed envelope for you to review at your convenience later.  YOU SHOULD EXPECT: Some feelings of bloating in the abdomen. Passage of more gas than usual.  Walking can help get rid of the air that was put into your GI tract during the procedure and reduce the bloating. If you had a lower endoscopy (such as a colonoscopy or flexible sigmoidoscopy) you may notice spotting of blood in your stool or on the toilet paper. If you underwent a bowel prep for your procedure, you may not have a normal bowel movement for a few days.  Please Note:  You might notice some irritation and congestion in your nose or some drainage.  This is from the oxygen used during your procedure.  There is no need for concern and it should clear up in a day or so.  SYMPTOMS TO REPORT IMMEDIATELY:  Following upper endoscopy (EGD)  Vomiting of blood or coffee ground material  New chest pain or pain under the shoulder blades  Painful or persistently difficult swallowing  New shortness of breath  Fever of 100F or higher  Black, tarry-looking stools  For urgent or emergent issues, a gastroenterologist can be reached at any hour by calling [phone]. Do not use MyChart messaging for urgent concerns.    DIET:  See Post Dilation diet Provided.  Drink plenty of fluids but you should avoid alcoholic beverages for 24 hours.  ACTIVITY:  You should plan to take it easy for the rest of today and you should NOT DRIVE or use heavy machinery until tomorrow  (because of the sedation medicines used during the test).    FOLLOW UP: Our staff will call the number listed on your records 48-72 hours following your procedure to check on you and address any questions or concerns that you may have regarding the information given to you following your procedure. If we do not reach you, we will leave a message.  We will attempt to reach you two times.  During this call, we will ask if you have developed any symptoms of COVID 19. If you develop any symptoms (ie: fever, flu-like symptoms, shortness of breath, cough etc.) before then, please call [phone].  If you test positive for Covid 19 in the 2 weeks post procedure, please call and report this information to [place].    If any biopsies were taken you will be contacted by phone or by letter within the next 1-3 weeks.  Please call us at [phone] if you have not heard about the biopsies in 3 weeks.    SIGNATURES/CONFIDENTIALITY: You and/or your care partner have signed paperwork which will be entered into your electronic medical record.  These signatures attest to the fact that that the information above on your After Visit Summary has been reviewed and is understood.  Full responsibility of the confidentiality of this discharge information lies with you and/or your care-partner.

## 2021-06-23 NOTE — Progress Notes (Signed)
Called to room to assist during endoscopic procedure.  Patient ID and intended procedure confirmed with present staff. Received instructions for my participation in the procedure from the performing physician.  

## 2021-06-23 NOTE — Progress Notes (Signed)
Pt in recovery with monitors in place, VSS. Report given to receiving RN. Bite guard was placed with pt awake to ensure comfort. No dental or soft tissue damage noted. 

## 2021-06-24 ENCOUNTER — Telehealth: Payer: Self-pay

## 2021-06-24 NOTE — Telephone Encounter (Signed)
  Follow up Call-     06/23/2021    3:19 PM  Call back number  Post procedure Call Back phone  # [phone]  Permission to leave phone message Yes     Patient questions:  Do you have a fever, pain , or abdominal swelling? No. Pain Score  0 *  Have you tolerated food without any problems? Yes.    Have you been able to return to your normal activities? Yes.    Do you have any questions about your discharge instructions: Diet   No. Medications  No. Follow up visit  No.  Do you have questions or concerns about your Care? No.  Actions: * If pain score is 4 or above: No action needed, pain <4.

## 2021-06-25 ENCOUNTER — Ambulatory Visit: Payer: Medicare Other | Admitting: Cardiology

## 2021-07-05 ENCOUNTER — Encounter: Payer: Self-pay | Admitting: Gastroenterology

## 2021-07-06 DIAGNOSIS — H31091 Other chorioretinal scars, right eye: Secondary | ICD-10-CM | POA: Diagnosis not present

## 2021-07-06 DIAGNOSIS — H26492 Other secondary cataract, left eye: Secondary | ICD-10-CM | POA: Diagnosis not present

## 2021-07-06 DIAGNOSIS — H11132 Conjunctival pigmentations, left eye: Secondary | ICD-10-CM | POA: Diagnosis not present

## 2021-07-06 DIAGNOSIS — H04123 Dry eye syndrome of bilateral lacrimal glands: Secondary | ICD-10-CM | POA: Diagnosis not present

## 2021-07-06 DIAGNOSIS — D3131 Benign neoplasm of right choroid: Secondary | ICD-10-CM | POA: Diagnosis not present

## 2021-07-06 DIAGNOSIS — Z961 Presence of intraocular lens: Secondary | ICD-10-CM | POA: Diagnosis not present

## 2021-07-25 ENCOUNTER — Other Ambulatory Visit: Payer: Self-pay | Admitting: Internal Medicine

## 2021-07-26 MED ORDER — TRAMADOL HCL 50 MG PO TABS: 50.0000 mg | ORAL_TABLET | Freq: Every day | ORAL | 0 refills | Status: DC

## 2021-08-11 ENCOUNTER — Encounter: Payer: Self-pay | Admitting: Gastroenterology

## 2021-10-06 DIAGNOSIS — N1831 Chronic kidney disease, stage 3a: Secondary | ICD-10-CM | POA: Diagnosis not present

## 2021-10-14 DIAGNOSIS — R5383 Other fatigue: Secondary | ICD-10-CM | POA: Diagnosis not present

## 2021-10-14 DIAGNOSIS — R7303 Prediabetes: Secondary | ICD-10-CM | POA: Diagnosis not present

## 2021-10-14 DIAGNOSIS — I5189 Other ill-defined heart diseases: Secondary | ICD-10-CM | POA: Diagnosis not present

## 2021-10-14 DIAGNOSIS — N1831 Chronic kidney disease, stage 3a: Secondary | ICD-10-CM | POA: Diagnosis not present

## 2021-10-14 DIAGNOSIS — E7211 Homocystinuria: Secondary | ICD-10-CM | POA: Diagnosis not present

## 2021-10-14 DIAGNOSIS — K2270 Barrett's esophagus without dysplasia: Secondary | ICD-10-CM | POA: Diagnosis not present

## 2021-10-14 DIAGNOSIS — I77810 Thoracic aortic ectasia: Secondary | ICD-10-CM | POA: Diagnosis not present

## 2021-10-14 DIAGNOSIS — E785 Hyperlipidemia, unspecified: Secondary | ICD-10-CM | POA: Diagnosis not present

## 2021-10-14 DIAGNOSIS — M109 Gout, unspecified: Secondary | ICD-10-CM | POA: Diagnosis not present

## 2021-10-14 DIAGNOSIS — N2581 Secondary hyperparathyroidism of renal origin: Secondary | ICD-10-CM | POA: Diagnosis not present

## 2021-10-19 ENCOUNTER — Encounter: Payer: Self-pay | Admitting: Internal Medicine

## 2021-10-19 ENCOUNTER — Other Ambulatory Visit (INDEPENDENT_AMBULATORY_CARE_PROVIDER_SITE_OTHER): Payer: Medicare Other

## 2021-10-19 DIAGNOSIS — M545 Low back pain, unspecified: Secondary | ICD-10-CM | POA: Diagnosis not present

## 2021-10-19 DIAGNOSIS — Z8739 Personal history of other diseases of the musculoskeletal system and connective tissue: Secondary | ICD-10-CM

## 2021-10-19 DIAGNOSIS — R7303 Prediabetes: Secondary | ICD-10-CM

## 2021-10-19 DIAGNOSIS — N1832 Chronic kidney disease, stage 3b: Secondary | ICD-10-CM | POA: Diagnosis not present

## 2021-10-19 DIAGNOSIS — E7800 Pure hypercholesterolemia, unspecified: Secondary | ICD-10-CM | POA: Diagnosis not present

## 2021-10-19 DIAGNOSIS — R5383 Other fatigue: Secondary | ICD-10-CM | POA: Diagnosis not present

## 2021-10-19 DIAGNOSIS — E7211 Homocystinuria: Secondary | ICD-10-CM

## 2021-10-19 DIAGNOSIS — G8929 Other chronic pain: Secondary | ICD-10-CM

## 2021-10-19 LAB — LIPID PANEL
Cholesterol: 109 mg/dL (ref 0–200)
HDL: 61 mg/dL (ref >39.00)
LDL Cholesterol: 39 mg/dL (ref 0–99)
NonHDL: 48.06
Total CHOL/HDL Ratio: 2
Triglycerides: 44 mg/dL (ref 0.0–149.0)
VLDL: 8.8 mg/dL (ref 0.0–40.0)

## 2021-10-19 LAB — COMPREHENSIVE METABOLIC PANEL
ALT: 23 U/L (ref 0–53)
AST: 31 U/L (ref 0–37)
Albumin: 4 g/dL (ref 3.5–5.2)
Alkaline Phosphatase: 72 U/L (ref 39–117)
BUN: 21 mg/dL (ref 6–23)
CO2: 28 mEq/L (ref 19–32)
Calcium: 9.4 mg/dL (ref 8.4–10.5)
Chloride: 106 mEq/L (ref 96–112)
Creatinine, Ser: 1.11 mg/dL (ref 0.40–1.50)
GFR: 64.51 mL/min (ref >60.00)
Glucose, Bld: 94 mg/dL (ref 70–99)
Potassium: 4.4 mEq/L (ref 3.5–5.1)
Sodium: 142 mEq/L (ref 135–145)
Total Bilirubin: 0.4 mg/dL (ref 0.2–1.2)
Total Protein: 6.6 g/dL (ref 6.0–8.3)

## 2021-10-19 LAB — TSH: TSH: 1.6 u[IU]/mL (ref 0.35–5.50)

## 2021-10-19 LAB — CBC WITH DIFFERENTIAL/PLATELET
Basophils Absolute: 0.1 10*3/uL (ref 0.0–0.1)
Basophils Relative: 1.1 % (ref 0.0–3.0)
Eosinophils Absolute: 0.2 10*3/uL (ref 0.0–0.7)
Eosinophils Relative: 2.6 % (ref 0.0–5.0)
HCT: 41.7 % (ref 39.0–52.0)
Hemoglobin: 13.9 g/dL (ref 13.0–17.0)
Lymphocytes Relative: 21.6 % (ref 12.0–46.0)
Lymphs Abs: 1.3 10*3/uL (ref 0.7–4.0)
MCHC: 33.3 g/dL (ref 30.0–36.0)
MCV: 96.3 fl (ref 78.0–100.0)
Monocytes Absolute: 0.6 10*3/uL (ref 0.1–1.0)
Monocytes Relative: 10.3 % (ref 3.0–12.0)
Neutro Abs: 3.7 10*3/uL (ref 1.4–7.7)
Neutrophils Relative %: 64.4 % (ref 43.0–77.0)
Platelets: 163 10*3/uL (ref 150.0–400.0)
RBC: 4.33 Mil/uL (ref 4.22–5.81)
RDW: 16.2 % — ABNORMAL HIGH (ref 11.5–15.5)
WBC: 5.8 10*3/uL (ref 4.0–10.5)

## 2021-10-19 LAB — FOLATE: Folate: >23.9 ng/mL (ref >5.9)

## 2021-10-19 LAB — VITAMIN B12: Vitamin B-12: 563 pg/mL (ref 211–911)

## 2021-10-19 LAB — HEMOGLOBIN A1C: Hgb A1c MFr Bld: 6 % (ref 4.6–6.5)

## 2021-10-19 NOTE — Progress Notes (Unsigned)
Subjective:    Patient ID: Tom Gaines, male    DOB: February 05, 1945, 76 y.o.   MRN: [id number]     HPI Tom Gaines is here for follow up of his chronic medical problems, including CKD 3b, hld, gout, chronic lower back pain, chronic b/l shoulder pain, prediabetes, homocysteine, chronic fatigue.   He is exercising regularly - 5 times a week.  He is not overdoing the sugars intake - may be eating too many carbs - eats whole wheat bread.   Still gets intermittent pressure in ears.  Can occasionally pop them but not always.  At night when he sleeps he often wakes up with a dry mouth - bought a strap to wear at night.  He often has his mouth open during the day.  No snoring. No witnessed apnea.  Denies nasal congestion.    Has a lot of phlegm in his throat since his last visit.    Jumped up and still has discomfort - 4 months ago.    [place] tired.   Slept 7 hr 58 last night.   Medications and allergies reviewed with patient and updated if appropriate.  Current Outpatient Medications on File Prior to Visit  Medication Sig Dispense Refill   acetaminophen (TYLENOL) 500 MG tablet Take 500 mg by mouth every 6 (six) hours as needed.     acyclovir (ZOVIRAX) 800 MG tablet Take 400 mg by mouth 2 (two) times daily.     allopurinol (ZYLOPRIM) 300 MG tablet TAKE 1 TABLET BY MOUTH EVERY DAY 90 tablet 1   Alpha-Lipoic Acid 600 MG CAPS Take by mouth.     Ascorbic Acid (VITAMIN C) 100 MG tablet Take 100 mg by mouth daily.     ASHWAGANDHA PO Take 300 mg by mouth 2 (two) times daily.     ASPIRIN 81 PO Take 1 tablet by mouth daily.     Azelastine HCl 137 MCG/SPRAY SOLN PLACE 1-2 SPRAYS INTO BOTH NOSTRILS 2 (TWO) TIMES DAILY AS NEEDED. USE IN EACH NOSTRIL AS DIRECTED 30 mL 3   B Complex Vitamins (VITAMIN B COMPLEX PO)      Bioflavonoid Products (CITRUS BIOFLAVONOIDS PO)      Cholecalciferol (VITAMIN D) 2000 units CAPS Take 1 capsule by mouth daily.     co-enzyme Q-10 30 MG capsule Take 100 mg by mouth  daily.     FOLIC ACID PO 2,536 mcg.     GLUCOSAMINE-CHONDROITIN PO Take 300 mg by mouth 3 (three) times daily.     L-CITRULLINE PO Take by mouth.     LYSINE PO Take 1,000 mg by mouth.     Multiple Vitamin (MULTIVITAMIN) tablet Take 1 tablet by mouth daily.     Omega-3 Fatty Acids (FISH OIL) 1000 MG CAPS Take 1,000 mg by mouth. 1 by mouth daily     Pyridoxine HCl (VITAMIN B6) 100 MG TABS      RESVERATROL PO Take by mouth. Patient takes bid     traMADol (ULTRAM) 50 MG tablet Take 1 tablet (50 mg total) by mouth daily. For chronic back pain 90 tablet 0   Turmeric Curcumin 500 MG CAPS Take 500 mg by mouth 3 (three) times daily.      Vitamin D-Vitamin K (D3 + K2 DOTS PO)      pantoprazole (PROTONIX) 40 MG tablet Take 1 tablet (40 mg total) by mouth in the morning. 30 tablet 11   rosuvastatin (CRESTOR) 40 MG tablet Take 1 tablet (40 mg total)  by mouth daily. 90 tablet 3   No current facility-administered medications on file prior to visit.     Review of Systems  Constitutional:  Positive for fatigue (in afternoon). Negative for fever.  Respiratory:  Negative for cough, shortness of breath and wheezing.   Cardiovascular:  Negative for chest pain, palpitations and leg swelling.  Neurological:  Positive for headaches (rare). Negative for light-headedness.       Objective:   Vitals:   10/20/21 1524  BP: (!) 106/58  Pulse: 72  Temp: 98.2 F (36.8 C)  SpO2: 96%   BP Readings from Last 3 Encounters:  10/20/21 (!) 106/58  06/23/21 122/76  05/19/21 126/66   Wt Readings from Last 3 Encounters:  10/20/21 174 lb (78.9 kg)  06/23/21 175 lb (79.4 kg)  05/19/21 175 lb (79.4 kg)   Body mass index is 24.27 kg/m.    Physical Exam Constitutional:      General: He is not in acute distress.    Appearance: Normal appearance. He is not ill-appearing.  HENT:     Head: Normocephalic and atraumatic.  Eyes:     Conjunctiva/sclera: Conjunctivae normal.  Cardiovascular:     Rate and Rhythm:  Normal rate and regular rhythm.     Heart sounds: Normal heart sounds. No murmur heard. Pulmonary:     Effort: Pulmonary effort is normal. No respiratory distress.     Breath sounds: Normal breath sounds. No wheezing or rales.  Musculoskeletal:     Right lower leg: No edema.     Left lower leg: No edema.  Skin:    General: Skin is warm and dry.     Findings: No rash.  Neurological:     Mental Status: He is alert. Mental status is at baseline.  Psychiatric:        Mood and Affect: Mood normal.        Lab Results  Component Value Date   WBC 5.8 10/19/2021   HGB 13.9 10/19/2021   HCT 41.7 10/19/2021   PLT 163.0 10/19/2021   GLUCOSE 94 10/19/2021   CHOL 109 10/19/2021   TRIG 44.0 10/19/2021   HDL 61.00 10/19/2021   LDLDIRECT 83.0 10/09/2019   LDLCALC 39 10/19/2021   ALT 23 10/19/2021   AST 31 10/19/2021   NA 142 10/19/2021   K 4.4 10/19/2021   CL 106 10/19/2021   CREATININE 1.11 10/19/2021   BUN 21 10/19/2021   CO2 28 10/19/2021   TSH 1.60 10/19/2021   PSA 0.60 03/01/2010   HGBA1C 6.0 10/19/2021     Assessment & Plan:    See Problem List for Assessment and Plan of chronic medical problems.

## 2021-10-19 NOTE — Patient Instructions (Addendum)
     Blood work was ordered.     Medications changes include :      Your prescription(s) have been sent to your pharmacy.    A referral was ordered for XX.     Someone from that office will call you to schedule an appointment.    Return in about 6 months (around 04/20/2022) for follow up.

## 2021-10-20 ENCOUNTER — Encounter: Payer: Self-pay | Admitting: Internal Medicine

## 2021-10-20 ENCOUNTER — Ambulatory Visit (INDEPENDENT_AMBULATORY_CARE_PROVIDER_SITE_OTHER): Payer: Medicare Other | Admitting: Internal Medicine

## 2021-10-20 VITALS — BP 106/58 | HR 72 | Temp 98.2°F | Ht 71.0 in | Wt 174.0 lb

## 2021-10-20 DIAGNOSIS — M545 Low back pain, unspecified: Secondary | ICD-10-CM | POA: Diagnosis not present

## 2021-10-20 DIAGNOSIS — R7303 Prediabetes: Secondary | ICD-10-CM

## 2021-10-20 DIAGNOSIS — H9190 Unspecified hearing loss, unspecified ear: Secondary | ICD-10-CM | POA: Diagnosis not present

## 2021-10-20 DIAGNOSIS — Z8739 Personal history of other diseases of the musculoskeletal system and connective tissue: Secondary | ICD-10-CM

## 2021-10-20 DIAGNOSIS — M25511 Pain in right shoulder: Secondary | ICD-10-CM

## 2021-10-20 DIAGNOSIS — M25512 Pain in left shoulder: Secondary | ICD-10-CM | POA: Diagnosis not present

## 2021-10-20 DIAGNOSIS — G8929 Other chronic pain: Secondary | ICD-10-CM

## 2021-10-20 DIAGNOSIS — E7211 Homocystinuria: Secondary | ICD-10-CM | POA: Diagnosis not present

## 2021-10-20 DIAGNOSIS — N1832 Chronic kidney disease, stage 3b: Secondary | ICD-10-CM | POA: Diagnosis not present

## 2021-10-20 DIAGNOSIS — E7800 Pure hypercholesterolemia, unspecified: Secondary | ICD-10-CM | POA: Diagnosis not present

## 2021-10-20 DIAGNOSIS — H938X3 Other specified disorders of ear, bilateral: Secondary | ICD-10-CM | POA: Insufficient documentation

## 2021-10-20 DIAGNOSIS — I2510 Atherosclerotic heart disease of native coronary artery without angina pectoris: Secondary | ICD-10-CM

## 2021-10-20 DIAGNOSIS — R7983 Abnormal findings of blood amino-acid level: Secondary | ICD-10-CM

## 2021-10-20 LAB — HOMOCYSTEINE: Homocysteine: 9.8 umol/L (ref <11.4)

## 2021-10-20 NOTE — Assessment & Plan Note (Signed)
Chronic Homocystine level much improved Continue folate, B12 and B6 supplementation

## 2021-10-20 NOTE — Assessment & Plan Note (Signed)
Chronic Following with nephrology Recent kidney function in the normal range improved-GFR of 64.5

## 2021-10-20 NOTE — Assessment & Plan Note (Signed)
Chronic Lab Results  Component Value Date   HGBA1C 6.0 10/19/2021   Sugars stable in prediabetic range Continue healthy diet, regular exercise

## 2021-10-20 NOTE — Assessment & Plan Note (Addendum)
Chronic Denies episodes of gout Continue allopurinol 300 mg daily-we will try cutting this down to 150 mg daily to see if that improves his fatigue and I will and if it does not we will increase back up to 300 Check uric acid level next visit

## 2021-10-20 NOTE — Assessment & Plan Note (Signed)
Chronic Coronary calcium score 320 Regular exercise and healthy diet encouraged Lipids well controlled Continue Crestor 40 mg daily

## 2021-10-20 NOTE — Assessment & Plan Note (Signed)
Chronic Doing back exercises regularly Pain controlled Continue tramadol 50 mg daily as needed Continue Tylenol as needed

## 2021-10-20 NOTE — Assessment & Plan Note (Signed)
Chronic Following with orthopedics Taking Tylenol as needed Also taking tramadol for more severe pain as needed-continue tramadol 50 mg daily as needed

## 2021-10-20 NOTE — Assessment & Plan Note (Signed)
Chronic Has a sensation of his ears being clogged and unable to unclog them Will refer to ENT for further evaluation

## 2021-10-20 NOTE — Assessment & Plan Note (Signed)
Does have some hearing loss and would like to evaluate further We will refer to ENT since he is also having some clogged ear sensation and wonders if that is related, also often has his mouth open out of habit?  Related

## 2021-10-28 ENCOUNTER — Ambulatory Visit: Payer: Medicare Other | Admitting: Cardiology

## 2021-10-31 NOTE — Progress Notes (Deleted)
Cardiology Clinic Note   Patient Name: Tom Gaines Date of Encounter: 10/31/2021  Primary Care Provider:  Binnie Rail, MD Primary Cardiologist:  Kirk Ruths, MD  Patient Profile    Tom Gaines 76 year old male presents to the clinic today for follow-up evaluation of his coronary artery disease PVCs, and a sending aortic dilation.  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 LDL goal equal less than 160, ideally less than 130. No FH MI or CVA   Low back pain 03/13/2014   Chronic, has seen Dr Tamala Julian and Dr Lynann Bologna Taking tramadol as needed   Nonallopathic lesion of lumbosacral region 12/01/2015   Nonallopathic lesion of sacral region 12/01/2015   Nonallopathic lesion of thoracic region 12/01/2015   Prediabetes 03/29/2016   Mom, sister DM   PROSTATE CANCER, HX OF 03/12/2008   Qualifier: Diagnosis of  By: Linna Darner MD, [facility]   Radical prostatectomy 1998, Urologist Dr. Lawerance Bach    Past Surgical History:  Procedure Laterality Date   CATARACT EXTRACTION, BILATERAL  2017   colonoscopy with polypectomy  2014   Dr Fuller Plan   LUMBAR [facility] SURGERY  2002   Dr Ellene Route   PROSTATECTOMY  1998   SHOULDER ARTHROSCOPY     right; 2011; left  1990   SIGMOIDOSCOPY     TONSILLECTOMY AND ADENOIDECTOMY      Allergies  No Known Allergies  History of Present Illness    Tom Gaines has a PMH of HLD, PVCs, ascending aortic dilation, and coronary artery disease.  His exercise treadmill test 11/11 showed no ST changes.  He has cardiac event monitor 8/20 showed sinus bradycardia and sinus rhythm along with sinus tachycardia and frequent PVCs.  He was also noted to have 6 beats of NSVT.  An abdominal ultrasound 9/20 showed no aneurysm.  His coronary calcium score 11/22 was 320 which placed him in the 54th percentile for age gender matched control.  He was noted to have an ascending aortic aneurysm that measured 4 cm.  His echocardiogram 11/22 showed normal LV function, G1 DD, mild dilation of his ascending aorta measuring 41 mm.  His echocardiogram 9/20 showed an aortic aneurysm measuring 4 cm.  He was seen by Dr. Stanford Breed on 04/19/2021.  During that time he denied orthopnea PND lower extremity swelling chest pain and syncope.  He denied palpitations.  He was able to walk on his treadmill and in his neighborhood or 45 minutes without cardiac symptoms.  He presents to the clinic today for follow-up evaluation states***  *** denies chest pain, shortness of breath, lower extremity edema, fatigue, palpitations, melena, hematuria, hemoptysis,  diaphoresis, weakness, presyncope, syncope, orthopnea, and PND.  Coronary artery disease-no chest pain today.  Denies exertional chest discomfort.  Continues to be physically active walking in his neighborhood and on his treadmill several days per week.  Calcium scoring 11/22 showed a score of 320. Continue aspirin, rosuvastatin, omega-3 fatty acids, turmeric Heart healthy low-sodium diet-salty 6 given Increase physical activity as tolerated  Hyperlipidemia-LDL 39 on 10/19/2021 Continue aspirin, rosuvastatin, omega-3 fatty acids, turmeric, co-Q10 Heart healthy low-sodium heart fiber diet Increase physical  activity as tolerated   Ascending aortic dilation-denies episodes of chest and back discomfort.  Echocardiogram 9/20 showed stable aortic aneurysm measuring 4 cm.  Blood pressure well controlled. Maintain good blood pressure Repeat echocardiogram  PVCs-no increased DOE or activity intolerance.  Notes occasional brief palpitations.  Previous cardiac monitor showed sinus bradycardia, sinus tachycardia, NSVT, and frequent PVCs. Avoid triggers caffeine, chocolate, EtOH, dehydration  Disposition: Follow-up with Dr. Stanford Breed or me in 6-9 months. Home Medications    Prior to Admission medications   Medication Sig Start Date End Date Taking? Authorizing Provider  acetaminophen (TYLENOL) 500 MG tablet Take 500 mg by mouth every 6 (six) hours as needed.    [provider]  acyclovir (ZOVIRAX) 800 MG tablet Take 400 mg by mouth 2 (two) times daily. 09/04/20   [provider]  allopurinol (ZYLOPRIM) 300 MG tablet TAKE 1 TABLET BY MOUTH EVERY DAY 05/25/21   Binnie Rail, MD  Alpha-Lipoic Acid 600 MG CAPS Take by mouth.    [provider]  Ascorbic Acid (VITAMIN C) 100 MG tablet Take 100 mg by mouth daily.    [provider]  ASHWAGANDHA PO Take 300 mg by mouth 2 (two) times daily.    [provider]  ASPIRIN 81 PO Take 1 tablet by mouth daily.    [provider]  Azelastine HCl 137 MCG/SPRAY SOLN PLACE 1-2 SPRAYS INTO BOTH NOSTRILS 2 (TWO) TIMES DAILY AS NEEDED. USE IN [facility] NOSTRIL AS DIRECTED 12/28/20   Garnet Sierras, DO  B Complex Vitamins (VITAMIN B COMPLEX PO)     [provider]  Bioflavonoid Products (CITRUS BIOFLAVONOIDS PO)     [provider]  Cholecalciferol (VITAMIN D) 2000 units CAPS Take 1 capsule by mouth daily.    [provider]  co-enzyme Q-10 30 MG capsule Take 100 mg by mouth daily.    [provider]  FOLIC ACID PO 7,619 mcg.    [provider]  GLUCOSAMINE-CHONDROITIN PO Take 300 mg by  mouth 3 (three) times daily.    [provider]  L-CITRULLINE PO Take by mouth.    [provider]  LYSINE PO Take 1,000 mg by mouth.    [provider]  Multiple Vitamin (MULTIVITAMIN) tablet Take 1 tablet by mouth daily.    [provider]  Omega-3 Fatty Acids (FISH OIL) 1000 MG CAPS Take 1,000 mg by mouth. 1 by mouth daily    [provider]  pantoprazole (PROTONIX) 40 MG tablet Take 1 tablet (40 mg total) by mouth in the morning. 05/27/21 06/26/21  Ladene Artist, MD  Pyridoxine HCl (VITAMIN B6) 100 MG TABS     [provider]  RESVERATROL PO Take by mouth. Patient takes bid    [provider]  rosuvastatin (CRESTOR) 40 MG tablet Take 1 tablet (40 mg total) by mouth daily. 04/19/21 07/18/21  Lelon Perla, MD  traMADol (ULTRAM) 50 MG tablet Take 1 tablet (50 mg total) by mouth  daily. For chronic back pain 07/26/21   Binnie Rail, MD  Turmeric Curcumin 500 MG CAPS Take 500 mg by mouth 3 (three) times daily.     [provider]  Vitamin D-Vitamin K (D3 + K2 DOTS PO)     [provider]    Family History    Family History  Problem Relation Age of Onset   Diabetes Mother    Dementia Mother    Diabetes Sister    Lung cancer Other        maternal uncle & aunt   Healthy Daughter    Colon cancer Neg Hx    Stomach cancer Neg Hx    Arthritis Neg Hx    Heart disease Neg Hx    Stroke Neg Hx    Esophageal cancer Neg Hx    Liver cancer Neg Hx    Pancreatic cancer Neg Hx    Rectal cancer Neg Hx    He indicated that his mother is deceased. He indicated that his father is deceased. He indicated that his sister is alive. He indicated that his daughter is alive. He indicated that the status of his neg hx is unknown. He indicated that the status of his other is unknown.  Social History    Social History   Socioeconomic History   Marital status: Married    Spouse name: Not on file   Number of children: Not  on file   Years of education: Not on file   Highest education level: Not on file  Occupational History   Occupation: retired  Tobacco Use   Smoking status: Former    Types: Cigarettes    Quit date: 01/31/1985    Years since quitting: 36.7   Smokeless tobacco: Never   Tobacco comments:    smoked  [place], up to 2.5 ppd  Vaping Use   Vaping Use: Never used  Substance and Sexual Activity   Alcohol use: No    Comment:  none since 2012   Drug use: No   Sexual activity: Yes  Other Topics Concern   Not on file  Social History Narrative   Exercises regularly - gym 5 days a week   Social Determinants of Health   Financial Resource Strain: Low Risk  (03/03/2021)   Overall Financial Resource Strain (CARDIA)    Difficulty of Paying Living Expenses: Not hard at all  Food Insecurity: No Food Insecurity (03/03/2021)   Hunger Vital Sign    Worried About Running Out of Food in the Last Year: Never true    [place] in the Last Year: Never true  Transportation Needs: No Transportation Needs (03/03/2021)   PRAPARE - Hydrologist (Medical): No    Lack of Transportation (Non-Medical): No  Physical Activity: Sufficiently Active (03/03/2021)   Exercise Vital Sign    Days of Exercise per Week: 5 days    Minutes of Exercise per Session: 30 min  Stress: No Stress Concern Present (03/03/2021)   [place]    Feeling of Stress : Not at all  Social Connections: [place] (03/03/2021)   Social Connection and Isolation Panel [NHANES]    Frequency of Communication with Friends and Family: More than three times a week    Frequency of Social Gatherings with Friends and Family: More than three times a week    Attends Religious Services: More than 4 times per year  Active Member of Clubs or Organizations: Yes    Attends Archivist Meetings: More than 4 times per year    Marital Status:  Married  Human resources officer Violence: Not At Risk (03/03/2021)   Humiliation, Afraid, Rape, and Kick questionnaire    Fear of Current or Ex-Partner: No    Emotionally Abused: No    Physically Abused: No    Sexually Abused: No     Review of Systems    General:  No chills, fever, night sweats or weight changes.  Cardiovascular:  No chest pain, dyspnea on exertion, edema, orthopnea, palpitations, paroxysmal nocturnal dyspnea. Dermatological: No rash, lesions/masses Respiratory: No cough, dyspnea Urologic: No hematuria, dysuria Abdominal:   No nausea, vomiting, diarrhea, bright red blood per rectum, melena, or hematemesis Neurologic:  No visual changes, wkns, changes in mental status. All other systems reviewed and are otherwise negative except as noted above.  Physical Exam    VS:  There were no vitals taken for this visit. , BMI There is no height or weight on file to calculate BMI. GEN: Well nourished, well developed, in no acute distress. HEENT: normal. Neck: Supple, no JVD, carotid bruits, or masses. Cardiac: RRR, no murmurs, rubs, or gallops. No clubbing, cyanosis, edema.  Radials/DP/PT 2+ and equal bilaterally.  Respiratory:  Respirations regular and unlabored, clear to auscultation bilaterally. GI: Soft, nontender, nondistended, BS + x 4. MS: no deformity or atrophy. Skin: warm and dry, no rash. Neuro:  Strength and sensation are intact. Psych: Normal affect.  Accessory Clinical Findings    Recent Labs: 10/19/2021: ALT 23; BUN 21; Creatinine, Ser 1.11; Hemoglobin 13.9; Platelets 163.0; Potassium 4.4; Sodium 142; TSH 1.60   Recent Lipid Panel    Component Value Date/Time   CHOL 109 10/19/2021 0846   CHOL 115 06/14/2021 0911   TRIG 44.0 10/19/2021 0846   HDL 61.00 10/19/2021 0846   HDL 57 06/14/2021 0911   CHOLHDL 2 10/19/2021 0846   VLDL 8.8 10/19/2021 0846   LDLCALC 39 10/19/2021 0846   LDLCALC 45 06/14/2021 0911   LDLDIRECT 83.0 10/09/2019 1541    No BP recorded.   {Refresh Note OR Click here to enter BP  :1}***    ECG personally reviewed by me today- *** - No acute changes  CT cardiac scoring 12/21/2020 FINDINGS: Coronary Calcium Score:   Left main: 0   Left anterior descending artery: 275   Left circumflex artery: 27   Right coronary artery: 0   Total: 320   Percentile: 54th   Pericardium: Normal.   Non-cardiac: See separate report from [facility].   IMPRESSION: Coronary calcium score of 320. This was 54th percentile for age-, race-, and sex-matched controls.   RECOMMENDATIONS: Coronary artery calcium (CAC) score is a strong predictor of incident coronary heart disease (CHD) and provides predictive information beyond traditional risk factors. CAC scoring is reasonable to use in the decision to withhold, postpone, or initiate statin therapy in intermediate-risk or selected borderline-risk asymptomatic adults (age 19-75 years and LDL-C >=70 to <190 mg/dL) who do not have diabetes or established atherosclerotic cardiovascular disease (ASCVD).* In intermediate-risk (10-year ASCVD risk >=7.5% to <20%) adults or selected borderline-risk (10-year ASCVD risk >=5% to <7.5%) adults in whom a CAC score is measured for the purpose of making a treatment decision the following recommendations have been made:   If CAC=0, it is reasonable to withhold statin therapy and reassess in 5 to 10 years, as long as higher risk conditions are absent (diabetes mellitus, family  history of premature CHD in first degree relatives (males <55 years; females <65 years), cigarette smoking, or LDL >=190 mg/dL).   If CAC is 1 to 99, it is reasonable to initiate statin therapy for patients >=42 years of age.   If CAC is >=100 or >=75th percentile, it is reasonable to initiate statin therapy at any age.   Cardiology referral should be considered for patients with CAC scores >=400 or >=75th percentile.   *2018  AHA/ACC/AACVPR/AAPA/ABC/ACPM/ADA/AGS/APhA/ASPC/NLA/PCNA Guideline on the Management of Blood Cholesterol: A Report of the American College of Cardiology/American Heart Association Task Force on Clinical Practice Guidelines. J Am Coll Cardiol. 2019;73(24):3168-3209.   Eleonore Chiquito, MD     Electronically Signed   By: Eleonore Chiquito M.D.   On: 12/21/2020 18:05  Assessment & Plan   1.  ***   Jossie Ng. Cason Dabney NP-C     10/31/2021, 2:26 PM [place] [address] Office [phone] Fax [phone]  Notice: This dictation was prepared with Dragon dictation along with smaller phrase technology. Any transcriptional errors that result from this process are unintentional and may not be corrected upon review.  I spent***minutes examining this patient, reviewing medications, and using patient centered shared decision making involving her cardiac care.  Prior to her visit I spent greater than 20 minutes reviewing her past medical history,  medications, and prior cardiac tests.

## 2021-11-01 ENCOUNTER — Encounter: Payer: Self-pay | Admitting: Internal Medicine

## 2021-11-01 ENCOUNTER — Telehealth: Payer: Medicare Other | Admitting: Physician Assistant

## 2021-11-01 ENCOUNTER — Telehealth: Payer: Self-pay | Admitting: Cardiology

## 2021-11-01 DIAGNOSIS — I2510 Atherosclerotic heart disease of native coronary artery without angina pectoris: Secondary | ICD-10-CM

## 2021-11-01 DIAGNOSIS — U071 COVID-19: Secondary | ICD-10-CM | POA: Diagnosis not present

## 2021-11-01 MED ORDER — NIRMATRELVIR/RITONAVIR (PAXLOVID) TABLET (RENAL DOSING): 2.0000 | ORAL_TABLET | Freq: Two times a day (BID) | ORAL | 0 refills | Status: AC

## 2021-11-01 NOTE — Telephone Encounter (Signed)
Pt is requesting call back to discuss lab orders. Would like to go and have blood drawn before appt on 10/06, but needs orders called in for this and would also like a call back to discuss if this will need to be fasting labs or not. Please advise.

## 2021-11-01 NOTE — Telephone Encounter (Signed)
Spoke with pt, Aware of dr crenshaw's recommendations.  °

## 2021-11-01 NOTE — Patient Instructions (Signed)
Tom Gaines, thank you for joining Mar Daring, PA-C for today's virtual visit.  While this provider is not your primary care provider (PCP), if your PCP is located in our provider database this encounter information will be shared with them immediately following your visit.  Consent: (Patient) Tom Gaines provided verbal consent for this virtual visit at the beginning of the encounter.  Current Medications:  Current Outpatient Medications:    nirmatrelvir/ritonavir EUA, renal dosing, (PAXLOVID) 10 x 150 MG & 10 x '100MG'$  TABS, Take 2 tablets by mouth 2 (two) times daily for 5 days. (Take nirmatrelvir 150 mg one tablet twice daily for 5 days and ritonavir 100 mg one tablet twice daily for 5 days) Patient GFR is 64.51, Disp: 20 tablet, Rfl: 0   acetaminophen (TYLENOL) 500 MG tablet, Take 500 mg by mouth every 6 (six) hours as needed., Disp: , Rfl:    acyclovir (ZOVIRAX) 800 MG tablet, Take 400 mg by mouth 2 (two) times daily., Disp: , Rfl:    allopurinol (ZYLOPRIM) 300 MG tablet, TAKE 1 TABLET BY MOUTH EVERY DAY, Disp: 90 tablet, Rfl: 1   Alpha-Lipoic Acid 600 MG CAPS, Take by mouth., Disp: , Rfl:    Ascorbic Acid (VITAMIN C) 100 MG tablet, Take 100 mg by mouth daily., Disp: , Rfl:    ASHWAGANDHA PO, Take 300 mg by mouth 2 (two) times daily., Disp: , Rfl:    ASPIRIN 81 PO, Take 1 tablet by mouth daily., Disp: , Rfl:    Azelastine HCl 137 MCG/SPRAY SOLN, PLACE 1-2 SPRAYS INTO BOTH NOSTRILS 2 (TWO) TIMES DAILY AS NEEDED. USE IN EACH NOSTRIL AS DIRECTED, Disp: 30 mL, Rfl: 3   B Complex Vitamins (VITAMIN B COMPLEX PO), , Disp: , Rfl:    Bioflavonoid Products (CITRUS BIOFLAVONOIDS PO), , Disp: , Rfl:    Cholecalciferol (VITAMIN D) 2000 units CAPS, Take 1 capsule by mouth daily., Disp: , Rfl:    co-enzyme Q-10 30 MG capsule, Take 100 mg by mouth daily., Disp: , Rfl:    FOLIC ACID PO, 1,941 mcg., Disp: , Rfl:    GLUCOSAMINE-CHONDROITIN PO, Take 300 mg by mouth 3 (three) times daily.,  Disp: , Rfl:    L-CITRULLINE PO, Take by mouth., Disp: , Rfl:    LYSINE PO, Take 1,000 mg by mouth., Disp: , Rfl:    Multiple Vitamin (MULTIVITAMIN) tablet, Take 1 tablet by mouth daily., Disp: , Rfl:    Omega-3 Fatty Acids (FISH OIL) 1000 MG CAPS, Take 1,000 mg by mouth. 1 by mouth daily, Disp: , Rfl:    pantoprazole (PROTONIX) 40 MG tablet, Take 1 tablet (40 mg total) by mouth in the morning., Disp: 30 tablet, Rfl: 11   Pyridoxine HCl (VITAMIN B6) 100 MG TABS, , Disp: , Rfl:    RESVERATROL PO, Take by mouth. Patient takes bid, Disp: , Rfl:    rosuvastatin (CRESTOR) 40 MG tablet, Take 1 tablet (40 mg total) by mouth daily., Disp: 90 tablet, Rfl: 3   traMADol (ULTRAM) 50 MG tablet, Take 1 tablet (50 mg total) by mouth daily. For chronic back pain, Disp: 90 tablet, Rfl: 0   Turmeric Curcumin 500 MG CAPS, Take 500 mg by mouth 3 (three) times daily. , Disp: , Rfl:    Vitamin D-Vitamin K (D3 + K2 DOTS PO), , Disp: , Rfl:    Medications ordered in this encounter:  Meds ordered this encounter  Medications   nirmatrelvir/ritonavir EUA, renal dosing, (PAXLOVID) 10 x 150 MG &  10 x '100MG'$  TABS    Sig: Take 2 tablets by mouth 2 (two) times daily for 5 days. (Take nirmatrelvir 150 mg one tablet twice daily for 5 days and ritonavir 100 mg one tablet twice daily for 5 days) Patient GFR is 64.51    Dispense:  20 tablet    Refill:  0    Order Specific Question:   Supervising Provider    Answer:   Chase Picket [id number]     *If you need refills on other medications prior to your next appointment, please contact your pharmacy*  Follow-Up: Call back or seek an in-person evaluation if the symptoms worsen or if the condition fails to improve as anticipated.  [place] [phone]  Other Instructions COVID-19 COVID-19, or coronavirus disease 2019, is an infection that is caused by a new (novel) coronavirus called SARS-CoV-2. COVID-19 can cause many symptoms. In some people, the virus  may not cause any symptoms. In others, it may cause mild or severe symptoms. Some people with severe infection develop severe disease. What are the causes? This illness is caused by a virus. The virus may be in the air as tiny specks of fluid (aerosols) or droplets, or it may be on surfaces. You may catch the virus by: Breathing in droplets from an infected person. Droplets can be spread by a person breathing, speaking, singing, coughing, or sneezing. Touching something, like a table or a doorknob, that has virus on it (is contaminated) and then touching your mouth, nose, or eyes. What increases the risk? Risk for infection: You are more likely to get infected with the COVID-19 virus if: You are within 6 ft (1.8 m) of a person with COVID-19 for 15 minutes or longer. You are providing care for a person who is infected with COVID-19. You are in close personal contact with other people. Close personal contact includes hugging, kissing, or sharing eating or drinking utensils. Risk for serious illness caused by COVID-19: You are more likely to get seriously ill from the COVID-19 virus if: You have cancer. You have a long-term (chronic) disease, such as: Chronic lung disease. This includes pulmonary embolism, chronic obstructive pulmonary disease, and cystic fibrosis. Long-term disease that lowers your body's ability to fight infection (immunocompromise). Serious cardiac conditions, such as heart failure, coronary artery disease, or cardiomyopathy. Diabetes. Chronic kidney disease. Liver diseases. These include cirrhosis, nonalcoholic fatty liver disease, alcoholic liver disease, or autoimmune hepatitis. You have obesity. You are pregnant or were recently pregnant. You have sickle cell disease. What are the signs or symptoms? Symptoms of this condition can range from mild to severe. Symptoms may appear any time from 2 to 14 days after being exposed to the virus. They include: Fever or  chills. Shortness of breath or trouble breathing. Feeling tired or very tired. Headaches, body aches, or muscle aches. Runny or stuffy nose, sneezing, coughing, or sore throat. New loss of taste or smell. This is rare. Some people may also have stomach problems, such as nausea, vomiting, or diarrhea. Other people may not have any symptoms of COVID-19. How is this diagnosed? This condition may be diagnosed by testing samples to check for the COVID-19 virus. The most common tests are the PCR test and the antigen test. Tests may be done in the lab or at home. They include: Using a swab to take a sample of fluid from the back of your nose and throat (nasopharyngeal fluid), from your nose, or from your throat. Testing a  sample of saliva from your mouth. Testing a sample of coughed-up mucus from your lungs (sputum). How is this treated? Treatment for COVID-19 infection depends on the severity of the condition. Mild symptoms can be managed at home with rest, fluids, and over-the-counter medicines. Serious symptoms may be treated in a hospital intensive care unit (ICU). Treatment in the ICU may include: Supplemental oxygen. Extra oxygen is given through a tube in the nose, a face mask, or a hood. Medicines. These may include: Antivirals, such as monoclonal antibodies. These help your body fight off certain viruses that can cause disease. Anti-inflammatories, such as corticosteroids. These reduce inflammation and suppress the immune system. Antithrombotics. These prevent or treat blood clots, if they develop. Convalescent plasma. This helps boost your immune system, if you have an underlying immunosuppressive condition or are getting immunosuppressive treatments. Prone positioning. This means you will lie on your stomach. This helps oxygen to get into your lungs. Infection control measures. If you are at risk for more serious illness caused by COVID-19, your health care provider may prescribe two  long-acting monoclonal antibodies, given together every 6 months. How is this prevented? To protect yourself: Use preventive medicine (pre-exposure prophylaxis). You may get pre-exposure prophylaxis if you have moderate or severe immunocompromise. Get vaccinated. Anyone 44 months old or older who meets guidelines can get a COVID-19 vaccine or vaccine series. This includes people who are pregnant or making breast milk (lactating). Get an added dose of COVID-19 vaccine after your first vaccine or vaccine series if you have moderate to severe immunocompromise. This applies if you have had a solid organ transplant or have been diagnosed with an immunocompromising condition. You should get the added dose 4 weeks after you got the first COVID-19 vaccine or vaccine series. If you get an mRNA vaccine, you will need a 3-dose primary series. If you get the J&J/Janssen vaccine, you will need a 2-dose primary series, with the second dose being an mRNA vaccine. Talk to your health care provider about getting experimental monoclonal antibodies. This treatment is approved under emergency use authorization to prevent severe illness before or after being exposed to the COVID-19 virus. You may be given monoclonal antibodies if: You have moderate or severe immunocompromise. This includes treatments that lower your immune response. People with immunocompromise may not develop protection against COVID-19 when they are vaccinated. You cannot be vaccinated. You may not get a vaccine if you have a severe allergic reaction to the vaccine or its components. You are not fully vaccinated. You are in a facility where COVID-19 is present and: Are in close contact with a person who is infected with the COVID-19 virus. Are at high risk of being exposed to the COVID-19 virus. You are at risk of illness from new variants of the COVID-19 virus. To protect others: If you have symptoms of COVID-19, take steps to prevent the virus from  spreading to others. Stay home. Leave your house only to get medical care. Do not use public transit, if possible. Do not travel while you are sick. Wash your hands often with soap and water for at least 20 seconds. If soap and water are not available, use alcohol-based hand sanitizer. Make sure that all people in your household wash their hands well and often. Cough or sneeze into a tissue or your sleeve or elbow. Do not cough or sneeze into your hand or into the air. Where to find more information Centers for Disease Control and Prevention: [employer]armC[employer].be World Health Organization: https://www.castaneda.info/  Get help right away if: You have trouble breathing. You have pain or pressure in your chest. You are confused. You have bluish lips and fingernails. You have trouble waking from sleep. You have symptoms that get worse. These symptoms may be an emergency. Get help right away. Call 911. Do not wait to see if the symptoms will go away. Do not drive yourself to the hospital. Summary COVID-19 is an infection that is caused by a new coronavirus. Sometimes, there are no symptoms. Other times, symptoms range from mild to severe. Some people with a severe COVID-19 infection develop severe disease. The virus that causes COVID-19 can spread from person to person through droplets or aerosols from breathing, speaking, singing, coughing, or sneezing. Mild symptoms of COVID-19 can be managed at home with rest, fluids, and over-the-counter medicines. This information is not intended to replace advice given to you by your health care provider. Make sure you discuss any questions you have with your health care provider. Document Revised: 01/07/2021 Document Reviewed: 01/07/2021 Elsevier Patient Education  [place].    If you have been instructed to have an in-person evaluation today at a local Urgent Care facility, please use the link below. It will take you to a list  of all of our available [facility], including address, phone number and hours of operation. Please do not delay care.  [facility]  If you or a family member do not have a primary care provider, use the link below to schedule a visit and establish care. When you choose a [facility] primary care physician or advanced practice provider, you gain a long-term partner in health. Find a Primary Care Provider  Learn more about [facility]'s in-office and virtual care options: [facility]

## 2021-11-01 NOTE — Telephone Encounter (Signed)
Attempted to contact, LVM  Left call back number.

## 2021-11-01 NOTE — Progress Notes (Signed)
Virtual Visit Consent   Tom Gaines, you are scheduled for a virtual visit with a [facility] provider today. Just as with appointments in the office, your consent must be obtained to participate. Your consent will be active for this visit and any virtual visit you may have with one of our providers in the next 365 days. If you have a MyChart account, a copy of this consent can be sent to you electronically.  As this is a virtual visit, video technology does not allow for your provider to perform a traditional examination. This may limit your provider's ability to fully assess your condition. If your provider identifies any concerns that need to be evaluated in person or the need to arrange testing (such as labs, EKG, etc.), we will make arrangements to do so. Although advances in technology are sophisticated, we cannot ensure that it will always work on either your end or our end. If the connection with a video visit is poor, the visit may have to be switched to a telephone visit. With either a video or telephone visit, we are not always able to ensure that we have a secure connection.  By engaging in this virtual visit, you consent to the provision of healthcare and authorize for your insurance to be billed (if applicable) for the services provided during this visit. Depending on your insurance coverage, you may receive a charge related to this service.  I need to obtain your verbal consent now. Are you willing to proceed with your visit today? Tom Gaines has provided verbal consent on 11/01/2021 for a virtual visit (video or telephone). Mar Daring, PA-C  Date: 11/01/2021 12:34 PM  Virtual Visit via Video Note   I, Mar Daring, connected with  Tom Gaines  ([id number], Jul 13, 1945) on 11/01/21 at 12:30 PM EDT by a video-enabled telemedicine application and verified that I am speaking with the correct person using two identifiers.  Location: Patient: Virtual Visit Location  Patient: Home Provider: Virtual Visit Location Provider: Home Office   I discussed the limitations of evaluation and management by telemedicine and the availability of in person appointments. The patient expressed understanding and agreed to proceed.    History of Present Illness: Tom Gaines is a 76 y.o. who identifies as a male who was assigned male at birth, and is being seen today for Covid 28.  HPI: URI  This is a new problem. Episode onset: Symptoms started yesterday; tested positive for Covid 19 this morning on at home test. The problem has been gradually worsening. There has been no fever. Associated symptoms include congestion, coughing, diarrhea (looser stool), headaches, rhinorrhea (post nasal drainage) and a sore throat. Pertinent negatives include no ear pain, nausea, plugged ear sensation, sinus pain or vomiting. Associated symptoms comments: Hoarseness, fatigue, malaise. Treatments tried: salt water gargle, tylenol. The treatment provided no relief.     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  Nasal sore 08/27/2018   Bilateral shoulder pain 10/03/2017   CKD (chronic kidney disease) 03/31/2017   Bilateral shoulder bursitis 02/01/2017   Prediabetes 03/29/2016   Nonallopathic lesion of lumbosacral region 12/01/2015   Nonallopathic lesion of sacral region 12/01/2015   Nonallopathic  lesion of thoracic region 12/01/2015   Low back pain 03/13/2014   Basal cell carcinoma 02/13/2013   Hyperlipidemia 03/12/2008   History of gout 03/12/2008   PROSTATE CANCER, HX OF 03/12/2008   History of colonic polyps 03/12/2008    Allergies: No Known Allergies Medications:  Current Outpatient Medications:    nirmatrelvir/ritonavir EUA, renal dosing, (PAXLOVID) 10 x 150 MG & 10 x '100MG'$  TABS, Take 2 tablets by mouth 2 (two) times daily for 5 days. (Take nirmatrelvir 150 mg one tablet twice daily for 5 days and ritonavir 100 mg one tablet twice daily for 5 days) Patient GFR is 64.51, Disp: 20 tablet, Rfl: 0   acetaminophen (TYLENOL) 500 MG tablet, Take 500 mg by mouth every 6 (six) hours as needed., Disp: , Rfl:    acyclovir (ZOVIRAX) 800 MG tablet, Take 400 mg by mouth 2 (two) times daily., Disp: , Rfl:    allopurinol (ZYLOPRIM) 300 MG tablet, TAKE 1 TABLET BY MOUTH EVERY DAY, Disp: 90 tablet, Rfl: 1   Alpha-Lipoic Acid 600 MG CAPS, Take by mouth., Disp: , Rfl:    Ascorbic Acid (VITAMIN C) 100 MG tablet, Take 100 mg by mouth daily., Disp: , Rfl:    ASHWAGANDHA PO, Take 300 mg by mouth 2 (two) times daily., Disp: , Rfl:    ASPIRIN 81 PO, Take 1 tablet by mouth daily., Disp: , Rfl:    Azelastine HCl 137 MCG/SPRAY SOLN, PLACE 1-2 SPRAYS INTO BOTH NOSTRILS 2 (TWO) TIMES DAILY AS NEEDED. USE IN EACH NOSTRIL AS DIRECTED, Disp: 30 mL, Rfl: 3   B Complex Vitamins (VITAMIN B COMPLEX PO), , Disp: , Rfl:    Bioflavonoid Products (CITRUS BIOFLAVONOIDS PO), , Disp: , Rfl:    Cholecalciferol (VITAMIN D) 2000 units CAPS, Take 1 capsule by mouth daily., Disp: , Rfl:    co-enzyme Q-10 30 MG capsule, Take 100 mg by mouth daily., Disp: , Rfl:    FOLIC ACID PO, 6,754 mcg., Disp: , Rfl:    GLUCOSAMINE-CHONDROITIN PO, Take 300 mg by mouth 3 (three) times daily., Disp: , Rfl:    L-CITRULLINE PO, Take by mouth., Disp: , Rfl:    LYSINE PO, Take 1,000 mg by mouth., Disp: , Rfl:    Multiple Vitamin (MULTIVITAMIN)  tablet, Take 1 tablet by mouth daily., Disp: , Rfl:    Omega-3 Fatty Acids (FISH OIL) 1000 MG CAPS, Take 1,000 mg by mouth. 1 by mouth daily, Disp: , Rfl:    pantoprazole (PROTONIX) 40 MG tablet, Take 1 tablet (40 mg total) by mouth in the morning., Disp: 30 tablet, Rfl: 11   Pyridoxine HCl (VITAMIN B6) 100 MG TABS, , Disp: , Rfl:    RESVERATROL PO, Take by mouth. Patient takes bid, Disp: , Rfl:    rosuvastatin (CRESTOR) 40 MG tablet, Take 1 tablet (40 mg total) by mouth daily., Disp: 90 tablet, Rfl: 3   traMADol (ULTRAM) 50 MG tablet, Take 1 tablet (50 mg total) by mouth daily. For chronic back pain, Disp: 90 tablet, Rfl: 0   Turmeric Curcumin 500 MG CAPS, Take 500 mg by mouth 3 (three) times daily. , Disp: , Rfl:    Vitamin D-Vitamin K (D3 + K2 DOTS PO), , Disp: , Rfl:  Observations/Objective: Patient is well-developed, well-nourished in no acute distress.  Resting comfortably at home.  Head is normocephalic, atraumatic.  No labored breathing.  Speech is clear and coherent with logical content.  Patient is alert and oriented at baseline.    Assessment and Plan: 1. COVID-19 - nirmatrelvir/ritonavir EUA, renal dosing, (PAXLOVID) 10 x 150 MG & 10 x '100MG'$  TABS; Take 2 tablets by mouth 2 (two) times daily for 5 days. (Take nirmatrelvir 150 mg one tablet twice daily for 5 days and ritonavir 100 mg one tablet twice daily for 5 days) Patient GFR is 64.51  Dispense: 20 tablet; Refill: 0  - Continue OTC symptomatic management of choice - Will send OTC vitamins and supplement information through AVS - Paxlovid prescribed (renal dosing used even though previous GFR was okay due to CKD history); Hold Rosuvastatin - Patient enrolled in MyChart symptom monitoring - Push fluids - Rest as needed - Discussed return precautions and when to seek in-person evaluation, sent via AVS as well   Follow Up Instructions: I discussed the assessment and treatment plan with the patient. The patient was provided  an opportunity to ask questions and all were answered. The patient agreed with the plan and demonstrated an understanding of the instructions.  A copy of instructions were sent to the patient via MyChart unless otherwise noted below.    The patient was advised to call back or seek an in-person evaluation if the symptoms worsen or if the condition fails to improve as anticipated.  Time:  I spent 12 minutes with the patient via telehealth technology discussing the above problems/concerns.    Mar Daring, PA-C

## 2021-11-01 NOTE — Telephone Encounter (Signed)
Patient returned call - he would like to have his LIPID, LIVER rechecked before his appointment on Friday.   Looks like this is what Dr.Crenshaw normally orders, will make sure okay to reorder.   Thanks!

## 2021-11-02 ENCOUNTER — Ambulatory Visit: Payer: Medicare Other | Admitting: General Practice

## 2021-11-03 ENCOUNTER — Encounter: Payer: Self-pay | Admitting: Family Medicine

## 2021-11-03 ENCOUNTER — Telehealth: Payer: Medicare Other | Admitting: Family Medicine

## 2021-11-03 DIAGNOSIS — U071 COVID-19: Secondary | ICD-10-CM

## 2021-11-03 NOTE — Progress Notes (Signed)
MyChart Video Visit    Virtual Visit via Video Note   This visit type was conducted due to national recommendations for restrictions regarding the COVID-19 Pandemic (e.g. social distancing) in an effort to limit this patient's exposure and mitigate transmission in our community. This patient is at least at moderate risk for complications without adequate follow up. This format is felt to be most appropriate for this patient at this time. Physical exam was limited by quality of the video and audio technology used for the visit. CMA was able to get the patient set up on a video visit.  Patient location: Home. Patient and provider in visit Provider location: Office  I discussed the limitations of evaluation and management by telemedicine and the availability of in person appointments. The patient expressed understanding and agreed to proceed.  Visit Date: 11/03/2021  Today's healthcare provider: Harland Dingwall, NP-C     Subjective:    Patient ID: Tom Gaines, male    DOB: 02/04/1945, 76 y.o.   MRN: [id number]  Chief Complaint  Patient presents with   Covid Positive    Monday morning    HPI Tested positive for Covid 2 days ago. Started Paxlovid 2 days ago. He did a Telehealth visit.  Symptom onset 5 days ago.   Sore throat, cough, and headache. Symptoms are improving. States he is not sure why he was scheduled to see me today. No concerns.       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 LDL goal equal less than 160, ideally less than 130. No FH MI or CVA   Low back pain 03/13/2014   Chronic, has seen Dr Tamala Julian and Dr Lynann Bologna Taking tramadol as needed   Nonallopathic lesion of lumbosacral region 12/01/2015   Nonallopathic lesion of sacral region 12/01/2015   Nonallopathic lesion of thoracic region 12/01/2015   Prediabetes 03/29/2016   Mom, sister DM   PROSTATE CANCER, HX OF 03/12/2008   Qualifier: Diagnosis of  By: Linna Darner MD, [facility]   Radical prostatectomy 1998, Urologist Dr. Lawerance Bach     Past Surgical History:  Procedure Laterality Date   CATARACT EXTRACTION, BILATERAL  2017   colonoscopy with polypectomy  2014   Dr Fuller Plan   LUMBAR [facility] SURGERY  2002   Dr Ellene Route   PROSTATECTOMY  1998   SHOULDER ARTHROSCOPY     right; 2011; left 1990   SIGMOIDOSCOPY     TONSILLECTOMY AND ADENOIDECTOMY      Family History  Problem Relation Age of Onset   Diabetes Mother    Dementia Mother    Diabetes Sister    Lung cancer Other  maternal uncle & aunt   Healthy Daughter    Colon cancer Neg Hx    Stomach cancer Neg Hx    Arthritis Neg Hx    Heart disease Neg Hx    Stroke Neg Hx    Esophageal cancer Neg Hx    Liver cancer Neg Hx    Pancreatic cancer Neg Hx    Rectal cancer Neg Hx     Social History   Socioeconomic History   Marital status: Married    Spouse name: Not on file   Number of children: Not on file   Years of education: Not on file   Highest education level: Not on file  Occupational History   Occupation: retired  Tobacco Use   Smoking status: Former    Types: Cigarettes    Quit date: 01/31/1985    Years since quitting: 36.7   Smokeless tobacco: Never   Tobacco comments:    smoked  [place], up to 2.5 ppd   Vaping Use   Vaping Use: Never used  Substance and Sexual Activity   Alcohol use: No    Comment:  none since 2012   Drug use: No   Sexual activity: Yes  Other Topics Concern   Not on file  Social History Narrative   Exercises regularly - gym 5 days a week   Social Determinants of Health   Financial Resource Strain: Low Risk  (03/03/2021)   Overall Financial Resource Strain (CARDIA)    Difficulty of Paying Living Expenses: Not hard at all  Food Insecurity: No Food Insecurity (03/03/2021)   Hunger Vital Sign    Worried About Running Out of Food in the Last Year: Never true    [place] in the Last Year: Never true  Transportation Needs: No Transportation Needs (03/03/2021)   PRAPARE - Hydrologist (Medical): No    Lack of Transportation (Non-Medical): No  Physical Activity: Sufficiently Active (03/03/2021)   Exercise Vital Sign    Days of Exercise per Week: 5 days    Minutes of Exercise per Session: 30 min  Stress: No Stress Concern Present (03/03/2021)   [place]    Feeling of Stress : Not at all  Social Connections: [place] (03/03/2021)   Social Connection and Isolation Panel [NHANES]    Frequency of Communication with Friends and Family: More than three times a week    Frequency of Social Gatherings with Friends and Family: More than three times a week    Attends Religious Services: More than 4 times per year    Active Member of [employer] or Organizations: Yes    Attends Music therapist: More than 4 times per year    Marital Status: Married  Human resources officer Violence: Not At Risk (03/03/2021)   Humiliation, Afraid, Rape, and Kick questionnaire    Fear of Current or Ex-Partner: No    Emotionally Abused: No    Physically Abused: No    Sexually Abused: No    Outpatient Medications Prior to Visit  Medication Sig Dispense Refill   acetaminophen (TYLENOL) 500 MG  tablet Take 500 mg by mouth every 6 (six) hours as needed.     acyclovir (ZOVIRAX) 800 MG tablet Take 400 mg by mouth 2 (two) times daily.     allopurinol (ZYLOPRIM) 300 MG tablet TAKE 1 TABLET BY MOUTH EVERY DAY 90 tablet 1   Alpha-Lipoic Acid 600 MG  CAPS Take by mouth.     Ascorbic Acid (VITAMIN C) 100 MG tablet Take 100 mg by mouth daily.     ASHWAGANDHA PO Take 300 mg by mouth 2 (two) times daily.     ASPIRIN 81 PO Take 1 tablet by mouth daily.     Azelastine HCl 137 MCG/SPRAY SOLN PLACE 1-2 SPRAYS INTO BOTH NOSTRILS 2 (TWO) TIMES DAILY AS NEEDED. USE IN EACH NOSTRIL AS DIRECTED 30 mL 3   B Complex Vitamins (VITAMIN B COMPLEX PO)      Bioflavonoid Products (CITRUS BIOFLAVONOIDS PO)      Cholecalciferol (VITAMIN D) 2000 units CAPS Take 1 capsule by mouth daily.     co-enzyme Q-10 30 MG capsule Take 100 mg by mouth daily.     FOLIC ACID PO 3,716 mcg.     GLUCOSAMINE-CHONDROITIN PO Take 300 mg by mouth 3 (three) times daily.     L-CITRULLINE PO Take by mouth.     LYSINE PO Take 1,000 mg by mouth.     Multiple Vitamin (MULTIVITAMIN) tablet Take 1 tablet by mouth daily.     nirmatrelvir/ritonavir EUA, renal dosing, (PAXLOVID) 10 x 150 MG & 10 x '100MG'$  TABS Take 2 tablets by mouth 2 (two) times daily for 5 days. (Take nirmatrelvir 150 mg one tablet twice daily for 5 days and ritonavir 100 mg one tablet twice daily for 5 days) Patient GFR is 64.51 20 tablet 0   Omega-3 Fatty Acids (FISH OIL) 1000 MG CAPS Take 1,000 mg by mouth. 1 by mouth daily     Pyridoxine HCl (VITAMIN B6) 100 MG TABS      RESVERATROL PO Take by mouth. Patient takes bid     traMADol (ULTRAM) 50 MG tablet Take 1 tablet (50 mg total) by mouth daily. For chronic back pain 90 tablet 0   Turmeric Curcumin 500 MG CAPS Take 500 mg by mouth 3 (three) times daily.      Vitamin D-Vitamin K (D3 + K2 DOTS PO)      pantoprazole (PROTONIX) 40 MG tablet Take 1 tablet (40 mg total) by mouth in the morning. 30 tablet 11   rosuvastatin  (CRESTOR) 40 MG tablet Take 1 tablet (40 mg total) by mouth daily. 90 tablet 3   No facility-administered medications prior to visit.    No Known Allergies  ROS     Objective:    Physical Exam  There were no vitals taken for this visit. Wt Readings from Last 3 Encounters:  10/20/21 174 lb (78.9 kg)  06/23/21 175 lb (79.4 kg)  05/19/21 175 lb (79.4 kg)       Assessment & Plan:   Problem List Items Addressed This Visit   None Visit Diagnoses     COVID-19 virus infection    -  Primary      No concerns. Taking Paxlovid and improving. Did not know why he was seeing me today.   I am having Pius L. Pearline Cables "Dick" maintain his multivitamin, Fish Oil, Turmeric Curcumin, vitamin C, Vitamin D, acetaminophen, Alpha-Lipoic Acid, L-CITRULLINE PO, LYSINE PO, RESVERATROL PO, acyclovir, ASHWAGANDHA PO, B Complex Vitamins (VITAMIN B COMPLEX PO), Bioflavonoid Products (CITRUS BIOFLAVONOIDS PO), FOLIC ACID PO, Vitamin B6, Vitamin D-Vitamin K (D3 + K2 DOTS PO), Azelastine HCl, co-enzyme Q-10, rosuvastatin, ASPIRIN 81 PO, GLUCOSAMINE-CHONDROITIN PO, allopurinol, pantoprazole, traMADol, and nirmatrelvir/ritonavir EUA (renal dosing).  No orders of the defined types were placed in this encounter.   I discussed the assessment and treatment plan with the  patient. The patient was provided an opportunity to ask questions and all were answered. The patient agreed with the plan and demonstrated an understanding of the instructions.   The patient was advised to call back or seek an in-person evaluation if the symptoms worsen or if the condition fails to improve as anticipated.  I provided 5 minutes of face-to-face time during this encounter.   Harland Dingwall, NP-C [facility] [place] [phone] (phone) [phone] (fax)  [place]

## 2021-11-05 ENCOUNTER — Ambulatory Visit: Payer: Medicare Other | Admitting: Physician Assistant

## 2021-11-11 ENCOUNTER — Other Ambulatory Visit: Payer: Self-pay | Admitting: Internal Medicine

## 2021-11-11 DIAGNOSIS — R972 Elevated prostate specific antigen [PSA]: Secondary | ICD-10-CM | POA: Diagnosis not present

## 2021-11-11 DIAGNOSIS — C61 Malignant neoplasm of prostate: Secondary | ICD-10-CM | POA: Diagnosis not present

## 2021-11-18 DIAGNOSIS — N529 Male erectile dysfunction, unspecified: Secondary | ICD-10-CM | POA: Diagnosis not present

## 2021-11-18 DIAGNOSIS — C61 Malignant neoplasm of prostate: Secondary | ICD-10-CM | POA: Diagnosis not present

## 2021-11-18 DIAGNOSIS — R972 Elevated prostate specific antigen [PSA]: Secondary | ICD-10-CM | POA: Diagnosis not present

## 2021-11-19 ENCOUNTER — Encounter: Payer: Self-pay | Admitting: Cardiology

## 2021-11-19 ENCOUNTER — Other Ambulatory Visit: Payer: Self-pay | Admitting: *Deleted

## 2021-11-19 DIAGNOSIS — I7121 Aneurysm of the ascending aorta, without rupture: Secondary | ICD-10-CM

## 2021-11-19 NOTE — Progress Notes (Unsigned)
Cardiology Office Note:    Date:  11/26/2021   ID:  Tom Gaines, DOB 09/23/1945, MRN [id number]  PCP:  Binnie Rail, MD  Cardiologist:  Kirk Ruths, MD  Electrophysiologist:  None   Referring MD: Binnie Rail, MD   Chief Complaint: routine follow of CAD and frequent PVCs  History of Present Illness:    Tom Gaines is a 76 y.o. male with a history of CAD based on elevated coronary calcium score in 12/2020, frequent PVCs on monitor in 09/2018, dilated aortic root, hyperlipidemia, pre-diabetes, and CKD stage III who is followed by Dr. Stanford Breed and presents today for routine follow-up.  Remote ETT in 2011 showed no ST changes. Patient was referred to Dr. Stanford Breed in 10/2018 after an outpatient monitor the month prior showed frequent PVCs and 6 beats of non-sustained VT. Echo in 10/2018 showed LVEF of 55-60% with grade 1 diastolic dysfunction and mild dilatation of the ascending aorta measuring 40 mm. Abdominal ultrasound in 10/2018 showed no evidence of AAA. Beta-blocker was not added due to patient preference. Coronary calcium score was ordered in 12/2020 for risk stratification and came back elevated at 320 (54th percentile for age and sex). Echo at that time showed LVEF of 60-65% with no regional wall motion abnormalities and grade 1 diastolic dysfunction, aortic valve sclerosis without any evidence of AS, and mild dilatation of the ascending aorta measuring 41 mm. Patient was last seen by Dr. Stanford Breed in 03/2021 at which time he reported dyspnea with more vigorous activities but was otherwise doing well from a cardiac standpoint.   Patient presents today for follow-up. Patient is doing well from a cardiac standpoint. He denies any chest pain, shortness of breath, orthopnea, PND, lower extremity edema, palpitations, lightheadedness, dizziness, or syncope.   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 LDL goal equal less than 160, ideally less than 130. No FH MI or CVA   Low back pain 03/13/2014   Chronic, has seen Dr Tamala Julian and Dr Lynann Bologna Taking tramadol as needed   Nonallopathic lesion of lumbosacral region 12/01/2015   Nonallopathic lesion of sacral region 12/01/2015   Nonallopathic lesion of thoracic region 12/01/2015   Prediabetes 03/29/2016   Mom, sister DM   PROSTATE CANCER, HX OF 03/12/2008   Qualifier: Diagnosis of  By: Linna Darner MD, [facility]   Radical prostatectomy 1998, Urologist Dr. Lawerance Bach     Past Surgical History:  Procedure Laterality Date   CATARACT EXTRACTION, BILATERAL  2017   colonoscopy with polypectomy  2014   Dr Fuller Plan   LUMBAR [facility] SURGERY  2002   Dr Ellene Route   PROSTATECTOMY  1998   SHOULDER ARTHROSCOPY  right; 2011; left 1990   SIGMOIDOSCOPY     TONSILLECTOMY AND ADENOIDECTOMY      Current Medications: Current Meds  Medication Sig   acetaminophen (TYLENOL) 500 MG tablet Take 500 mg by mouth every 6 (six) hours as needed.   acyclovir (ZOVIRAX) 800 MG tablet Take 400 mg by mouth 2 (two) times daily.   allopurinol (ZYLOPRIM) 300 MG tablet TAKE 1 TABLET BY MOUTH EVERY DAY   Alpha-Lipoic Acid 600 MG CAPS Take by mouth.   Ascorbic Acid (VITAMIN C) 100 MG tablet Take 100 mg by mouth daily.   ASHWAGANDHA PO Take 300 mg by mouth  2 (two) times daily.   ASPIRIN 81 PO Take 1 tablet by mouth daily.   Azelastine HCl 137 MCG/SPRAY SOLN PLACE 1-2 SPRAYS INTO BOTH NOSTRILS 2 (TWO) TIMES DAILY AS NEEDED. USE IN EACH NOSTRIL AS DIRECTED   B Complex Vitamins (VITAMIN B COMPLEX PO)    Cholecalciferol (VITAMIN D) 2000 units CAPS Take 1 capsule by mouth daily.   co-enzyme Q-10 30 MG capsule Take 100 mg by mouth daily.   FOLIC ACID PO 3,762 mcg.   GLUCOSAMINE-CHONDROITIN PO Take 300 mg by mouth 3 (three) times daily.   L-CITRULLINE PO Take by mouth.   LYSINE PO Take 1,000 mg by mouth.   Multiple Vitamin (MULTIVITAMIN) tablet Take 1 tablet by mouth daily.   Omega-3 Fatty Acids (FISH OIL) 1000 MG CAPS Take 1,000 mg by mouth. 1 by mouth daily   Pyridoxine HCl (VITAMIN B6) 100 MG TABS    RESVERATROL PO Take by mouth. Patient takes bid   traMADol (ULTRAM) 50 MG tablet Take 1 tablet (50 mg total) by mouth daily. For chronic back pain   Turmeric Curcumin 500 MG CAPS Take 500 mg by mouth 3 (three) times daily.    Vitamin D-Vitamin K (D3 + K2 DOTS PO)      Allergies:   Patient has no known allergies.   Social History   Socioeconomic History   Marital status: Married    Spouse name: Not on file   Number of children: Not on file   Years of education: Not on file   Highest education level: Not on file  Occupational History   Occupation: retired  Tobacco Use   Smoking status: Former    Types: Cigarettes    Quit date: 01/31/1985    Years since quitting: 36.8   Smokeless tobacco: Never   Tobacco comments:    smoked  [place], up to 2.5 ppd  Vaping Use   Vaping Use: Never used  Substance and Sexual Activity   Alcohol use: No    Comment:  none since 2012   Drug use: No   Sexual activity: Yes  Other Topics Concern   Not on file  Social History Narrative   Exercises regularly - gym 5 days a week   Social Determinants of Health   Financial Resource Strain: Low Risk  (03/03/2021)   Overall Financial Resource Strain  (CARDIA)    Difficulty of Paying Living Expenses: Not hard at all  Food Insecurity: No Food Insecurity (03/03/2021)   Hunger Vital Sign    Worried About Running Out of Food in the Last Year: Never true    [place] in the Last Year: Never true  Transportation Needs: No Transportation Needs (03/03/2021)   PRAPARE - Hydrologist (Medical): No    Lack of Transportation (Non-Medical): No  Physical Activity: Sufficiently  Active (03/03/2021)   Exercise Vital Sign    Days of Exercise per Week: 5 days    Minutes of Exercise per Session: 30 min  Stress: No Stress Concern Present (03/03/2021)   [place]    Feeling of Stress : Not at all  Social Connections: [place] (03/03/2021)   Social Connection and Isolation Panel [NHANES]    Frequency of Communication with Friends and Family: More than three times a week    Frequency of Social Gatherings with Friends and Family: More than three times a week    Attends Religious Services: More than 4 times per year    Active Member of [employer] or Organizations: Yes    Attends Music therapist: More than 4 times per year    Marital Status: Married     Family History: The patient's family history includes Dementia in his mother; Diabetes in his mother and sister; Healthy in his daughter; Lung cancer in an other family member. There is no history of Colon cancer, Stomach cancer, Arthritis, Heart disease, Stroke, Esophageal cancer, Liver cancer, Pancreatic cancer, or Rectal cancer.  ROS:   Please see the history of present illness.     EKGs/Labs/Other Studies Reviewed:    The following studies were reviewed today:  CT Cardiac Scoring 12/21/2020: Impression: Coronary calcium score of 320. This was 54th percentile for age-, race-, and sex-matched controls.  Non-Cardiac Impression: 4 cm ascending thoracic aortic aneurysm. Recommend annual  imaging followup by CTA or MRA. _______________  Echocardiogram 12/21/2020: Impressions: 1. Left ventricular ejection fraction, by estimation, is 60 to 65%. The  left ventricle has normal function. The left ventricle has no regional  wall motion abnormalities. Left ventricular diastolic parameters are  consistent with Grade I diastolic  dysfunction (impaired relaxation). The average left ventricular global  longitudinal strain is -22.4 %. The global longitudinal strain is normal.   2. Right ventricular systolic function is normal. The right ventricular  size is normal.   3. The mitral valve is abnormal. Trivial mitral valve regurgitation. No  evidence of mitral stenosis.   4. The aortic valve is tricuspid. There is mild calcification of the  aortic valve. Aortic valve regurgitation is not visualized. Aortic valve  sclerosis is present, with no evidence of aortic valve stenosis.   5. Aortic dilatation noted. There is mild dilatation of the ascending  aorta, measuring 41 mm.   6. The inferior vena cava is normal in size with greater than 50%  respiratory variability, suggesting right atrial pressure of 3 mmHg.    EKG:  EKG ordered today. EKG personally reviewed and demonstrates sinus bradycardia, rate 58 bpm, with 1st degree AV block (PR interval of 208 ms) but no acute ST/T changes. Normal axis. QTc 402 ms.  Recent Labs: 10/19/2021: ALT 23; Hemoglobin 13.9; Platelets 163.0; TSH 1.60 11/26/2021: BUN 28; Creatinine, Ser 1.26; Potassium 4.9; Sodium 144  Recent Lipid Panel    Component Value Date/Time   CHOL 109 10/19/2021 0846   CHOL 115 06/14/2021 0911   TRIG 44.0 10/19/2021 0846   HDL 61.00 10/19/2021 0846   HDL 57 06/14/2021 0911   CHOLHDL 2 10/19/2021 0846   VLDL 8.8 10/19/2021 0846   LDLCALC 39 10/19/2021 0846   LDLCALC 45 06/14/2021 0911   LDLDIRECT 83.0 10/09/2019 1541    Physical Exam:    Vital Signs: BP 130/72 (BP Location: Left Arm, Patient Position: Sitting, Cuff  Size: Normal)   Pulse (!) 58  Wt 174 lb (78.9 kg)   SpO2 95%   BMI 24.27 kg/m     Wt Readings from Last 3 Encounters:  11/26/21 174 lb (78.9 kg)  10/20/21 174 lb (78.9 kg)  06/23/21 175 lb (79.4 kg)     General: 76 y.o. male in no acute distress. HEENT: Normocephalic and atraumatic. Sclera clear.  Neck: Supple. No JVD. Heart: Mildly bradycardic with normal rhythm. Distinct S1 and S2. No murmurs, gallops, or rubs.  Lungs: No increased work of breathing. Clear to ausculation bilaterally. No wheezes, rhonchi, or rales.  Abdomen: Soft, non-distended, and non-tender to palpation.  Extremities: No lower extremity edema.    Skin: Warm and dry. Neuro: Alert and oriented x3. No focal deficits. Psych: Normal affect. Responds appropriately.  Assessment:    1. Coronary artery disease involving native coronary artery of native heart without angina pectoris   2. Frequent PVCs   3. Ascending aorta dilatation (HCC)   4. Hyperlipidemia, unspecified hyperlipidemia type   5. Stage 3 chronic kidney disease, unspecified whether stage 3a or 3b CKD (HCC)     Plan:    CAD Coronary calcium score in 12/2020 was 320 placing him in the 54th percentile for age and sex.  - No chest pain. - Continue aspirin and high-intensity statin.   Frequent PVCs Noted on monitor in 09/2018. Echo in 12/2020 showed normal LV function. - Asymptomatic with this. No ectopy noted on EKG or exam today. - No AV nodal agents as patient is mildly bradycardia. He has also declined beta-blocker in the past.  Ascending Aorta Dilatation  CT Cardiac Scoring in 12/2020 showed a 4cm ascending thoracic aortic aneurysm. Last Echo in 12/2020 showed mild dilatation of the ascending aorta measuring 41 mm.  - Repeat chest CTA has already been ordered and is scheduled for 12/28/2021. Will check BMET today in anticipation of this.   Hyperlipidemia Recent lipid panel in 10/2021: Total Cholesterol 109, Triglycerides 44, HDL 61, LDL 39.  LDL goal <70 given CAD. - Continue Crestor '40mg'$  daily.  CKD Stage III Baseline creatinine ranged from 1.3 to 1.7 over the last couple of years. Creatinine 1.11 on last labs in 10/2021.  Disposition: Follow up in 1 year.    Medication Adjustments/Labs and Tests Ordered: Current medicines are reviewed at length with the patient today.  Concerns regarding medicines are outlined above.  Orders Placed This Encounter  Procedures   Basic metabolic panel   EKG 51-WCHE   No orders of the defined types were placed in this encounter.   Patient Instructions  Medication Instructions:  No changes *If you need a refill on your cardiac medications before your next appointment, please call your pharmacy*   Lab Work: Your provider would like for you to have the following labs today: BMET  If you have labs (blood work) drawn today and your tests are completely normal, you will receive your results only by: [place] (if you have MyChart) OR A paper copy in the mail If you have any lab test that is abnormal or we need to change your treatment, we will call you to review the results.   Testing/Procedures: None ordered   Follow-Up: At [facility], you and your health needs are our priority.  As part of our continuing mission to provide you with exceptional heart care, we have created designated Provider Care Teams.  These Care Teams include your primary Cardiologist (physician) and Advanced Practice Providers (APPs -  Physician Assistants and Nurse Practitioners) who all  work together to provide you with the care you need, when you need it.  We recommend signing up for the patient portal called "MyChart".  Sign up information is provided on this After Visit Summary.  MyChart is used to connect with patients for Virtual Visits (Telemedicine).  Patients are able to view lab/test results, encounter notes, upcoming appointments, etc.  Non-urgent messages can be sent to your provider as  well.   To learn more about what you can do with MyChart, go to [email or web address].    Your next appointment:   12 month(s)  The format for your next appointment:   In Person  Provider:   Kirk Ruths, MD      Important Information About Sugar         Signed, Darreld Mclean, PA-C  11/27/2021 10:02 AM    [place]

## 2021-11-26 ENCOUNTER — Encounter: Payer: Self-pay | Admitting: Student

## 2021-11-26 ENCOUNTER — Ambulatory Visit: Payer: Medicare Other | Attending: Physician Assistant | Admitting: Student

## 2021-11-26 VITALS — BP 130/72 | HR 58 | Wt 174.0 lb

## 2021-11-26 DIAGNOSIS — I493 Ventricular premature depolarization: Secondary | ICD-10-CM | POA: Diagnosis not present

## 2021-11-26 DIAGNOSIS — N183 Chronic kidney disease, stage 3 unspecified: Secondary | ICD-10-CM | POA: Insufficient documentation

## 2021-11-26 DIAGNOSIS — E785 Hyperlipidemia, unspecified: Secondary | ICD-10-CM | POA: Insufficient documentation

## 2021-11-26 DIAGNOSIS — I77810 Thoracic aortic ectasia: Secondary | ICD-10-CM | POA: Diagnosis not present

## 2021-11-26 DIAGNOSIS — I2510 Atherosclerotic heart disease of native coronary artery without angina pectoris: Secondary | ICD-10-CM | POA: Insufficient documentation

## 2021-11-26 NOTE — Patient Instructions (Signed)
Medication Instructions:  No changes *If you need a refill on your cardiac medications before your next appointment, please call your pharmacy*   Lab Work: Your provider would like for you to have the following labs today: BMET  If you have labs (blood work) drawn today and your tests are completely normal, you will receive your results only by: [place] (if you have MyChart) OR A paper copy in the mail If you have any lab test that is abnormal or we need to change your treatment, we will call you to review the results.   Testing/Procedures: None ordered   Follow-Up: At [facility], you and your health needs are our priority.  As part of our continuing mission to provide you with exceptional heart care, we have created designated Provider Care Teams.  These Care Teams include your primary Cardiologist (physician) and Advanced Practice Providers (APPs -  Physician Assistants and Nurse Practitioners) who all work together to provide you with the care you need, when you need it.  We recommend signing up for the patient portal called "MyChart".  Sign up information is provided on this After Visit Summary.  MyChart is used to connect with patients for Virtual Visits (Telemedicine).  Patients are able to view lab/test results, encounter notes, upcoming appointments, etc.  Non-urgent messages can be sent to your provider as well.   To learn more about what you can do with MyChart, go to [email or web address].    Your next appointment:   12 month(s)  The format for your next appointment:   In Person  Provider:   Kirk Ruths, MD      Important Information About Sugar

## 2021-11-27 ENCOUNTER — Encounter: Payer: Self-pay | Admitting: Student

## 2021-11-27 LAB — BASIC METABOLIC PANEL
BUN/Creatinine Ratio: 22 (ref 10–24)
BUN: 28 mg/dL — ABNORMAL HIGH (ref 8–27)
CO2: 25 mmol/L (ref 20–29)
Calcium: 9.5 mg/dL (ref 8.6–10.2)
Chloride: 103 mmol/L (ref 96–106)
Creatinine, Ser: 1.26 mg/dL (ref 0.76–1.27)
Glucose: 85 mg/dL (ref 70–99)
Potassium: 4.9 mmol/L (ref 3.5–5.2)
Sodium: 144 mmol/L (ref 134–144)
eGFR: 59 mL/min/{1.73_m2} — ABNORMAL LOW (ref >59)

## 2021-11-29 DIAGNOSIS — J310 Chronic rhinitis: Secondary | ICD-10-CM | POA: Diagnosis not present

## 2021-11-29 DIAGNOSIS — H838X3 Other specified diseases of inner ear, bilateral: Secondary | ICD-10-CM | POA: Diagnosis not present

## 2021-11-29 DIAGNOSIS — H903 Sensorineural hearing loss, bilateral: Secondary | ICD-10-CM | POA: Diagnosis not present

## 2021-11-29 DIAGNOSIS — R0982 Postnasal drip: Secondary | ICD-10-CM | POA: Diagnosis not present

## 2021-11-29 DIAGNOSIS — J342 Deviated nasal septum: Secondary | ICD-10-CM | POA: Diagnosis not present

## 2021-11-29 DIAGNOSIS — J343 Hypertrophy of nasal turbinates: Secondary | ICD-10-CM | POA: Diagnosis not present

## 2021-12-13 DIAGNOSIS — Z23 Encounter for immunization: Secondary | ICD-10-CM | POA: Diagnosis not present

## 2021-12-28 ENCOUNTER — Encounter: Payer: Self-pay | Admitting: Internal Medicine

## 2021-12-28 ENCOUNTER — Other Ambulatory Visit: Payer: Medicare Other

## 2021-12-29 ENCOUNTER — Other Ambulatory Visit: Payer: Self-pay

## 2021-12-29 MED ORDER — ALLOPURINOL 300 MG PO TABS: 300.0000 mg | ORAL_TABLET | Freq: Every day | ORAL | 1 refills | Status: DC

## 2022-01-06 ENCOUNTER — Other Ambulatory Visit: Payer: Medicare Other

## 2022-01-07 DIAGNOSIS — H903 Sensorineural hearing loss, bilateral: Secondary | ICD-10-CM | POA: Diagnosis not present

## 2022-01-13 ENCOUNTER — Encounter: Payer: Self-pay | Admitting: Cardiology

## 2022-01-13 ENCOUNTER — Ambulatory Visit
Admission: RE | Admit: 2022-01-13 | Discharge: 2022-01-13 | Disposition: A | Discharge status: Home / Self Care | Payer: Medicare Other | Source: Ambulatory Visit | Attending: Cardiology | Admitting: Cardiology

## 2022-01-13 DIAGNOSIS — I712 Thoracic aortic aneurysm, without rupture, unspecified: Secondary | ICD-10-CM | POA: Diagnosis not present

## 2022-01-13 DIAGNOSIS — J432 Centrilobular emphysema: Secondary | ICD-10-CM | POA: Diagnosis not present

## 2022-01-13 DIAGNOSIS — I7121 Aneurysm of the ascending aorta, without rupture: Secondary | ICD-10-CM

## 2022-01-13 DIAGNOSIS — I2510 Atherosclerotic heart disease of native coronary artery without angina pectoris: Secondary | ICD-10-CM | POA: Diagnosis not present

## 2022-01-13 MED ORDER — IOPAMIDOL (ISOVUE-370) INJECTION 76%
75.0000 mL | Freq: Once | INTRAVENOUS | Status: AC | PRN
  Administered 2022-01-13: 75 mL via INTRAVENOUS

## 2022-01-14 ENCOUNTER — Other Ambulatory Visit: Payer: Self-pay

## 2022-01-14 DIAGNOSIS — I77810 Thoracic aortic ectasia: Secondary | ICD-10-CM

## 2022-01-19 ENCOUNTER — Encounter (INDEPENDENT_AMBULATORY_CARE_PROVIDER_SITE_OTHER): Payer: Medicare Other | Admitting: Internal Medicine

## 2022-01-19 DIAGNOSIS — M159 Polyosteoarthritis, unspecified: Secondary | ICD-10-CM

## 2022-01-28 DIAGNOSIS — M19031 Primary osteoarthritis, right wrist: Secondary | ICD-10-CM | POA: Diagnosis not present

## 2022-01-31 DIAGNOSIS — M159 Polyosteoarthritis, unspecified: Secondary | ICD-10-CM

## 2022-02-01 MED ORDER — GABAPENTIN 100 MG PO CAPS: 100.0000 mg | ORAL_CAPSULE | Freq: Two times a day (BID) | ORAL | 5 refills | Status: DC

## 2022-02-01 NOTE — Telephone Encounter (Signed)
Please see the MyChart message reply(ies) for my assessment and plan.   The patient gave consent for this Medical Advice Message and is aware that it may result in a bill to their insurance company as well as the possibility that this may result in a co-payment or deductible. They are an established patient, but are not seeking medical advice exclusively about a problem treated during an in person or video visit in the last 7 days. I did not recommend an in person or video visit within 7 days of my reply.   I spent a total of 7 minutes cumulative time within 7 days through MyChart messaging  Monroe Toure J Ortha Metts, MD   

## 2022-02-16 ENCOUNTER — Ambulatory Visit: Payer: Medicare Other | Admitting: Cardiology

## 2022-02-28 DIAGNOSIS — J310 Chronic rhinitis: Secondary | ICD-10-CM | POA: Diagnosis not present

## 2022-02-28 DIAGNOSIS — J342 Deviated nasal septum: Secondary | ICD-10-CM | POA: Diagnosis not present

## 2022-02-28 DIAGNOSIS — J343 Hypertrophy of nasal turbinates: Secondary | ICD-10-CM | POA: Diagnosis not present

## 2022-02-28 DIAGNOSIS — R0982 Postnasal drip: Secondary | ICD-10-CM | POA: Diagnosis not present

## 2022-02-28 DIAGNOSIS — H6121 Impacted cerumen, right ear: Secondary | ICD-10-CM | POA: Diagnosis not present

## 2022-02-28 DIAGNOSIS — H903 Sensorineural hearing loss, bilateral: Secondary | ICD-10-CM | POA: Diagnosis not present

## 2022-03-03 MED ORDER — GABAPENTIN 100 MG PO CAPS: 200.0000 mg | ORAL_CAPSULE | Freq: Two times a day (BID) | ORAL | 5 refills | Status: DC

## 2022-03-03 NOTE — Addendum Note (Signed)
Addended by: Binnie Rail on: 03/03/2022 08:56 PM   Modules accepted: Orders

## 2022-03-11 ENCOUNTER — Other Ambulatory Visit: Payer: Self-pay | Admitting: Internal Medicine

## 2022-03-14 MED ORDER — TRAMADOL HCL 50 MG PO TABS: 50.0000 mg | ORAL_TABLET | Freq: Every day | ORAL | 0 refills | Status: AC

## 2022-04-06 DIAGNOSIS — L309 Dermatitis, unspecified: Secondary | ICD-10-CM | POA: Diagnosis not present

## 2022-04-06 DIAGNOSIS — Z85828 Personal history of other malignant neoplasm of skin: Secondary | ICD-10-CM | POA: Diagnosis not present

## 2022-04-18 ENCOUNTER — Telehealth: Payer: Medicare Other | Admitting: Physician Assistant

## 2022-04-18 DIAGNOSIS — B9689 Other specified bacterial agents as the cause of diseases classified elsewhere: Secondary | ICD-10-CM

## 2022-04-18 DIAGNOSIS — J019 Acute sinusitis, unspecified: Secondary | ICD-10-CM

## 2022-04-18 MED ORDER — AMOXICILLIN-POT CLAVULANATE 875-125 MG PO TABS: 1.0000 | ORAL_TABLET | Freq: Two times a day (BID) | ORAL | 0 refills | Status: DC

## 2022-04-18 NOTE — Progress Notes (Signed)

## 2022-04-19 NOTE — Progress Notes (Unsigned)
Subjective:    Patient ID: Tom Gaines, male    DOB: 06-Dec-1945, 77 y.o.   MRN: [id number]     HPI Tom Gaines is here for follow up of his chronic medical problems.  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  Has been using stone to take care of calluses.  Did e-visit two days ago - sinus infection- today coughed up mucus.  Has had a little blood in the mucus from his nose.  Eyes have watered a little  5 days a week of exercise.   Medications and allergies reviewed with patient and updated if appropriate.  Current Outpatient Medications on File Prior to Visit  Medication Sig Dispense Refill   acetaminophen (TYLENOL) 500 MG tablet Take 500 mg by mouth every 6 (six) hours as needed.     acyclovir (ZOVIRAX) 800 MG tablet Take 400 mg by mouth 2 (two) times daily.     allopurinol (ZYLOPRIM) 300 MG tablet Take 1 tablet (300 mg total) by mouth daily. 90 tablet 1   Alpha-Lipoic Acid 600 MG CAPS Take by mouth.     amoxicillin-clavulanate (AUGMENTIN) 875-125 MG tablet Take 1 tablet by mouth 2 (two) times daily. 14 tablet 0   Ascorbic Acid (VITAMIN C) 100 MG tablet Take 100 mg by mouth daily.     ASHWAGANDHA PO Take 300 mg by mouth 2 (two) times daily.     ASPIRIN 81 PO Take 1 tablet by mouth daily.     Azelastine HCl 137 MCG/SPRAY SOLN PLACE 1-2 SPRAYS INTO BOTH NOSTRILS 2 (TWO) TIMES DAILY AS NEEDED. USE IN EACH NOSTRIL AS DIRECTED 30 mL 3   B Complex Vitamins (VITAMIN B COMPLEX PO)      Cholecalciferol (VITAMIN D) 2000 units CAPS Take 1 capsule by mouth daily.     co-enzyme Q-10 30 MG capsule Take 100 mg by mouth daily.     FOLIC ACID PO XX123456 mcg.     gabapentin (NEURONTIN) 100 MG capsule Take 2-3 capsules (200-300 mg total) by mouth 2 (two) times daily. 180 capsule 5   GLUCOSAMINE-CHONDROITIN PO Take 300 mg by mouth 3 (three) times daily.      L-CITRULLINE PO Take by mouth.     LYSINE PO Take 1,000 mg by mouth.     Multiple Vitamin (MULTIVITAMIN) tablet Take 1 tablet by mouth daily.     Omega-3 Fatty Acids (FISH OIL) 1000 MG CAPS Take 1,000 mg by mouth. 1 by mouth daily     Pyridoxine HCl (VITAMIN B6) 100 MG TABS      RESVERATROL PO Take by mouth. Patient takes bid     traMADol (ULTRAM) 50 MG tablet Take 1 tablet (50 mg total) by mouth daily. For chronic back pain 90 tablet 0   Turmeric Curcumin 500 MG CAPS Take 500 mg by mouth 3 (three) times daily.      Vitamin D-Vitamin K (D3 + K2 DOTS PO)      pantoprazole (PROTONIX) 40 MG tablet Take 1 tablet (40 mg total) by mouth in the morning. 30 tablet 11   rosuvastatin (CRESTOR) 40 MG tablet Take 1 tablet (40 mg total) by mouth daily. 90 tablet 3   No current facility-administered medications on file prior to visit.     Review of Systems  Constitutional:  Negative for fever.  HENT:  Positive for postnasal drip and sinus pressure.   Respiratory:  Positive for cough. Negative for shortness of breath and wheezing.   Cardiovascular:  Negative for chest pain, palpitations and leg swelling.  Neurological:  Negative for light-headedness and headaches.       Objective:   Vitals:   04/20/22 1545  BP: 118/60  Pulse: 73  Temp: 98.6 F (37 C)   BP Readings from Last 3 Encounters:  04/20/22 118/60  04/20/22 118/60  11/26/21 130/72   Wt Readings from Last 3 Encounters:  04/20/22 174 lb (78.9 kg)  04/20/22 174 lb 3.2 oz (79 kg)  11/26/21 174 lb (78.9 kg)   Body mass index is 24.27 kg/m.    Physical Exam Constitutional:      General: He is not in acute distress.    Appearance: Normal appearance. He is not ill-appearing.  HENT:     Head: Normocephalic and atraumatic.  Eyes:     Conjunctiva/sclera: Conjunctivae normal.  Cardiovascular:     Rate and Rhythm: Normal rate and regular rhythm.     Heart sounds: Normal heart sounds.  Pulmonary:     Effort: Pulmonary effort is  normal. No respiratory distress.     Breath sounds: Normal breath sounds. No wheezing or rales.  Musculoskeletal:     Right lower leg: No edema.     Left lower leg: No edema.  Skin:    General: Skin is warm and dry.     Findings: No rash.  Neurological:     Mental Status: He is alert. Mental status is at baseline.  Psychiatric:        Mood and Affect: Mood normal.        Lab Results  Component Value Date   WBC 6.5 04/20/2022   HGB 15.0 04/20/2022   HCT 45.4 04/20/2022   PLT 167.0 04/20/2022   GLUCOSE 90 04/20/2022   CHOL 124 04/20/2022   TRIG 61.0 04/20/2022   HDL 68.60 04/20/2022   LDLDIRECT 83.0 10/09/2019   LDLCALC 43 04/20/2022   ALT 28 04/20/2022   AST 39 (H) 04/20/2022   NA 138 04/20/2022   K 4.3 04/20/2022   CL 102 04/20/2022   CREATININE 1.28 04/20/2022   BUN 22 04/20/2022   CO2 28 04/20/2022   TSH 1.60 10/19/2021   PSA 0.60 03/01/2010   HGBA1C 6.0 04/20/2022     Assessment & Plan:    See Problem List for Assessment and Plan of chronic medical problems.

## 2022-04-19 NOTE — Patient Instructions (Addendum)
      Blood work was ordered.   The lab is on the first floor.    Medications changes include :       A referral was ordered for XXX.     Someone will call you to schedule an appointment.    Return in about 6 months (around 10/21/2022) for follow up.

## 2022-04-20 ENCOUNTER — Ambulatory Visit (INDEPENDENT_AMBULATORY_CARE_PROVIDER_SITE_OTHER): Payer: Medicare Other

## 2022-04-20 ENCOUNTER — Other Ambulatory Visit (INDEPENDENT_AMBULATORY_CARE_PROVIDER_SITE_OTHER): Payer: Medicare Other

## 2022-04-20 ENCOUNTER — Ambulatory Visit (INDEPENDENT_AMBULATORY_CARE_PROVIDER_SITE_OTHER): Payer: Medicare Other | Admitting: Internal Medicine

## 2022-04-20 ENCOUNTER — Encounter: Payer: Self-pay | Admitting: Internal Medicine

## 2022-04-20 VITALS — BP 118/60 | HR 73 | Temp 98.6°F | Ht 71.0 in | Wt 174.2 lb

## 2022-04-20 VITALS — BP 118/60 | HR 73 | Temp 98.6°F | Ht 71.0 in | Wt 174.0 lb

## 2022-04-20 DIAGNOSIS — E7800 Pure hypercholesterolemia, unspecified: Secondary | ICD-10-CM | POA: Diagnosis not present

## 2022-04-20 DIAGNOSIS — Z0000 Encounter for general adult medical examination without abnormal findings: Secondary | ICD-10-CM | POA: Diagnosis not present

## 2022-04-20 DIAGNOSIS — G8929 Other chronic pain: Secondary | ICD-10-CM

## 2022-04-20 DIAGNOSIS — M25512 Pain in left shoulder: Secondary | ICD-10-CM

## 2022-04-20 DIAGNOSIS — R7983 Abnormal findings of blood amino-acid level: Secondary | ICD-10-CM

## 2022-04-20 DIAGNOSIS — M545 Low back pain, unspecified: Secondary | ICD-10-CM

## 2022-04-20 DIAGNOSIS — N1832 Chronic kidney disease, stage 3b: Secondary | ICD-10-CM

## 2022-04-20 DIAGNOSIS — M25511 Pain in right shoulder: Secondary | ICD-10-CM

## 2022-04-20 DIAGNOSIS — R7303 Prediabetes: Secondary | ICD-10-CM | POA: Diagnosis not present

## 2022-04-20 DIAGNOSIS — Z8739 Personal history of other diseases of the musculoskeletal system and connective tissue: Secondary | ICD-10-CM

## 2022-04-20 LAB — LIPID PANEL
Cholesterol: 124 mg/dL (ref 0–200)
HDL: 68.6 mg/dL (ref >39.00)
LDL Cholesterol: 43 mg/dL (ref 0–99)
NonHDL: 55.1
Total CHOL/HDL Ratio: 2
Triglycerides: 61 mg/dL (ref 0.0–149.0)
VLDL: 12.2 mg/dL (ref 0.0–40.0)

## 2022-04-20 LAB — CBC WITH DIFFERENTIAL/PLATELET
Basophils Absolute: 0 10*3/uL (ref 0.0–0.1)
Basophils Relative: 0.5 % (ref 0.0–3.0)
Eosinophils Absolute: 0.1 10*3/uL (ref 0.0–0.7)
Eosinophils Relative: 1.6 % (ref 0.0–5.0)
HCT: 45.4 % (ref 39.0–52.0)
Hemoglobin: 15 g/dL (ref 13.0–17.0)
Lymphocytes Relative: 15.7 % (ref 12.0–46.0)
Lymphs Abs: 1 10*3/uL (ref 0.7–4.0)
MCHC: 33.1 g/dL (ref 30.0–36.0)
MCV: 96.8 fl (ref 78.0–100.0)
Monocytes Absolute: 1.1 10*3/uL — ABNORMAL HIGH (ref 0.1–1.0)
Monocytes Relative: 17.6 % — ABNORMAL HIGH (ref 3.0–12.0)
Neutro Abs: 4.2 10*3/uL (ref 1.4–7.7)
Neutrophils Relative %: 64.6 % (ref 43.0–77.0)
Platelets: 167 10*3/uL (ref 150.0–400.0)
RBC: 4.69 Mil/uL (ref 4.22–5.81)
RDW: 15.7 % — ABNORMAL HIGH (ref 11.5–15.5)
WBC: 6.5 10*3/uL (ref 4.0–10.5)

## 2022-04-20 LAB — COMPREHENSIVE METABOLIC PANEL
ALT: 28 U/L (ref 0–53)
AST: 39 U/L — ABNORMAL HIGH (ref 0–37)
Albumin: 4.2 g/dL (ref 3.5–5.2)
Alkaline Phosphatase: 88 U/L (ref 39–117)
BUN: 22 mg/dL (ref 6–23)
CO2: 28 mEq/L (ref 19–32)
Calcium: 9.9 mg/dL (ref 8.4–10.5)
Chloride: 102 mEq/L (ref 96–112)
Creatinine, Ser: 1.28 mg/dL (ref 0.40–1.50)
GFR: 54.18 mL/min — ABNORMAL LOW (ref >60.00)
Glucose, Bld: 90 mg/dL (ref 70–99)
Potassium: 4.3 mEq/L (ref 3.5–5.1)
Sodium: 138 mEq/L (ref 135–145)
Total Bilirubin: 0.7 mg/dL (ref 0.2–1.2)
Total Protein: 7.3 g/dL (ref 6.0–8.3)

## 2022-04-20 LAB — URIC ACID: Uric Acid, Serum: 5.2 mg/dL (ref 4.0–7.8)

## 2022-04-20 LAB — HEMOGLOBIN A1C: Hgb A1c MFr Bld: 6 % (ref 4.6–6.5)

## 2022-04-20 LAB — FOLATE: Folate: >23.8 ng/mL (ref >5.9)

## 2022-04-20 LAB — VITAMIN B12: Vitamin B-12: 534 pg/mL (ref 211–911)

## 2022-04-20 MED ORDER — ALLOPURINOL 300 MG PO TABS: 300.0000 mg | ORAL_TABLET | Freq: Every day | ORAL | 1 refills | Status: DC

## 2022-04-20 NOTE — Assessment & Plan Note (Signed)
Chronic Doing back exercises regularly Pain controlled Continue tramadol 50 mg daily as needed Continue Tylenol as needed 

## 2022-04-20 NOTE — Assessment & Plan Note (Signed)
Chronic Denies episodes of gout Continue allopurinol 300 mg daily Uric acid level well-controlled at 5.2

## 2022-04-20 NOTE — Patient Instructions (Signed)
Tom Gaines , Thank you for taking time to come for your Medicare Wellness Visit. I appreciate your ongoing commitment to your health goals. Please review the following plan we discussed and let me know if I can assist you in the future.   These are the goals we discussed:  Goals      Client will verbalize knowledge of diabetes self-management as evidenced by Hgb A1C <7 or as defined by provider.     My goal is to keep my HgA1C down to below 5.7%        This is a list of the screening recommended for you and due dates:  Health Maintenance  Topic Date Due   Medicare Annual Wellness Visit  03/03/2022   COVID-19 Vaccine (7 - 2023-24 season) 03/12/2022   Colon Cancer Screening  04/27/2022   DTaP/Tdap/Td vaccine (3 - Td or Tdap) 08/26/2028   Pneumonia Vaccine  Completed   Flu Shot  Completed   Hepatitis C Screening: USPSTF Recommendation to screen - Ages 18-79 yo.  Completed   Zoster (Shingles) Vaccine  Completed   HPV Vaccine  Aged Out    Advanced directives: Yes  Conditions/risks identified: Yes  Next appointment: Follow up in one year for your annual wellness visit.   Preventive Care 77 Years and Older, Male  Preventive care refers to lifestyle choices and visits with your health care provider that can promote health and wellness. What does preventive care include? A yearly physical exam. This is also called an annual well check. Dental exams once or twice a year. Routine eye exams. Ask your health care provider how often you should have your eyes checked. Personal lifestyle choices, including: Daily care of your teeth and gums. Regular physical activity. Eating a healthy diet. Avoiding tobacco and drug use. Limiting alcohol use. Practicing safe sex. Taking low doses of aspirin every day. Taking vitamin and mineral supplements as recommended by your health care provider. What happens during an annual well check? The services and screenings done by your health care provider  during your annual well check will depend on your age, overall health, lifestyle risk factors, and family history of disease. Counseling  Your health care provider may ask you questions about your: Alcohol use. Tobacco use. Drug use. Emotional well-being. Home and relationship well-being. Sexual activity. Eating habits. History of falls. Memory and ability to understand (cognition). Work and work Statistician. Screening  You may have the following tests or measurements: Height, weight, and BMI. Blood pressure. Lipid and cholesterol levels. These may be checked every 5 years, or more frequently if you are over 65 years old. Skin check. Lung cancer screening. You may have this screening every year starting at age 32 if you have a 30-pack-year history of smoking and currently smoke or have quit within the past 15 years. Fecal occult blood test (FOBT) of the stool. You may have this test every year starting at age 35. Flexible sigmoidoscopy or colonoscopy. You may have a sigmoidoscopy every 5 years or a colonoscopy every 10 years starting at age 36. Prostate cancer screening. Recommendations will vary depending on your family history and other risks. Hepatitis C blood test. Hepatitis B blood test. Sexually transmitted disease (STD) testing. Diabetes screening. This is done by checking your blood sugar (glucose) after you have not eaten for a while (fasting). You may have this done every 1-3 years. Abdominal aortic aneurysm (AAA) screening. You may need this if you are a current or former smoker. Osteoporosis. You may  be screened starting at age 72 if you are at high risk. Talk with your health care provider about your test results, treatment options, and if necessary, the need for more tests. Vaccines  Your health care provider may recommend certain vaccines, such as: Influenza vaccine. This is recommended every year. Tetanus, diphtheria, and acellular pertussis (Tdap, Td) vaccine. You  may need a Td booster every 10 years. Zoster vaccine. You may need this after age 40. Pneumococcal 13-valent conjugate (PCV13) vaccine. One dose is recommended after age 56. Pneumococcal polysaccharide (PPSV23) vaccine. One dose is recommended after age 81. Talk to your health care provider about which screenings and vaccines you need and how often you need them. This information is not intended to replace advice given to you by your health care provider. Make sure you discuss any questions you have with your health care provider. Document Released: 02/13/2015 Document Revised: 10/07/2015 Document Reviewed: 11/18/2014 Elsevier Interactive Patient Education  2017 [place] Prevention in the Home Falls can cause injuries. They can happen to people of all ages. There are many things you can do to make your home safe and to help prevent falls. What can I do on the outside of my home? Regularly fix the edges of walkways and driveways and fix any cracks. Remove anything that might make you trip as you walk through a door, such as a raised step or threshold. Trim any bushes or trees on the path to your home. Use bright outdoor lighting. Clear any walking paths of anything that might make someone trip, such as rocks or tools. Regularly check to see if handrails are loose or broken. Make sure that both sides of any steps have handrails. Any raised decks and porches should have guardrails on the edges. Have any leaves, snow, or ice cleared regularly. Use sand or salt on walking paths during winter. Clean up any spills in your garage right away. This includes oil or grease spills. What can I do in the bathroom? Use night lights. Install grab bars by the toilet and in the tub and shower. Do not use towel bars as grab bars. Use non-skid mats or decals in the tub or shower. If you need to sit down in the shower, use a plastic, non-slip stool. Keep the floor dry. Clean up any water that spills  on the floor as soon as it happens. Remove soap buildup in the tub or shower regularly. Attach bath mats securely with double-sided non-slip rug tape. Do not have throw rugs and other things on the floor that can make you trip. What can I do in the bedroom? Use night lights. Make sure that you have a light by your bed that is easy to reach. Do not use any sheets or blankets that are too big for your bed. They should not hang down onto the floor. Have a firm chair that has side arms. You can use this for support while you get dressed. Do not have throw rugs and other things on the floor that can make you trip. What can I do in the kitchen? Clean up any spills right away. Avoid walking on wet floors. Keep items that you use a lot in easy-to-reach places. If you need to reach something above you, use a strong step stool that has a grab bar. Keep electrical cords out of the way. Do not use floor polish or wax that makes floors slippery. If you must use wax, use non-skid floor wax. Do not  have throw rugs and other things on the floor that can make you trip. What can I do with my stairs? Do not leave any items on the stairs. Make sure that there are handrails on both sides of the stairs and use them. Fix handrails that are broken or loose. Make sure that handrails are as long as the stairways. Check any carpeting to make sure that it is firmly attached to the stairs. Fix any carpet that is loose or worn. Avoid having throw rugs at the top or bottom of the stairs. If you do have throw rugs, attach them to the floor with carpet tape. Make sure that you have a light switch at the top of the stairs and the bottom of the stairs. If you do not have them, ask someone to add them for you. What else can I do to help prevent falls? Wear shoes that: Do not have high heels. Have rubber bottoms. Are comfortable and fit you well. Are closed at the toe. Do not wear sandals. If you use a stepladder: Make  sure that it is fully opened. Do not climb a closed stepladder. Make sure that both sides of the stepladder are locked into place. Ask someone to hold it for you, if possible. Clearly mark and make sure that you can see: Any grab bars or handrails. First and last steps. Where the edge of each step is. Use tools that help you move around (mobility aids) if they are needed. These include: Canes. Walkers. Scooters. Crutches. Turn on the lights when you go into a dark area. Replace any light bulbs as soon as they burn out. Set up your furniture so you have a clear path. Avoid moving your furniture around. If any of your floors are uneven, fix them. If there are any pets around you, be aware of where they are. Review your medicines with your doctor. Some medicines can make you feel dizzy. This can increase your chance of falling. Ask your doctor what other things that you can do to help prevent falls. This information is not intended to replace advice given to you by your health care provider. Make sure you discuss any questions you have with your health care provider. Document Released: 11/13/2008 Document Revised: 06/25/2015 Document Reviewed: 02/21/2014 Elsevier Interactive Patient Education  2017 [employer].

## 2022-04-20 NOTE — Assessment & Plan Note (Signed)
Chronic Following with nephrology-sees him tomorrow Overall GFR stable

## 2022-04-20 NOTE — Assessment & Plan Note (Signed)
Chronic Coronary calcium score 320 Regular exercise and healthy diet  Lipids well controlled Continue Crestor 40 mg daily

## 2022-04-20 NOTE — Assessment & Plan Note (Signed)
Chronic Following with orthopedics Taking Tylenol as needed Also taking tramadol for more severe pain as needed-continue tramadol 50 mg daily as needed 

## 2022-04-20 NOTE — Assessment & Plan Note (Signed)
Chronic Lab Results  Component Value Date   HGBA1C 6.0 04/20/2022   Sugars stable in prediabetic range Continue healthy diet, regular exercise

## 2022-04-20 NOTE — Progress Notes (Signed)
Subjective:   Tom Gaines is a 77 y.o. male who presents for Medicare Annual/Subsequent preventive examination.  Review of Systems     Cardiac Risk Factors include: advanced age (>65men, >20 women);dyslipidemia;male gender     Objective:    Today's Vitals   04/20/22 1441  BP: 118/60  Pulse: 73  Temp: 98.6 F (37 C)  SpO2: 95%  Weight: 174 lb 3.2 oz (79 kg)  Height: 5\' 11"  (1.803 m)  PainSc: 0-No pain   Body mass index is 24.3 kg/m.     04/20/2022    3:09 PM 03/03/2021    3:25 PM 01/30/2020    1:42 PM 04/10/2018    4:24 PM 04/26/2017   10:06 AM 03/31/2017    4:50 PM 07/28/2015    2:23 PM  Advanced Directives  Does Patient Have a Medical Advance Directive? Yes Yes Yes Yes No Yes Yes  Type of Paramedic of [place];Living will Living will;Healthcare Power of [place];Living will  [place];Living will   Does patient want to make changes to medical advance directive?  No - Patient declined No - Patient declined      Copy of [place] in Chart? No - copy requested No - copy requested  No - copy requested  No - copy requested No - copy requested    Current Medications (verified) Outpatient Encounter Medications as of 04/20/2022  Medication Sig   ipratropium (ATROVENT) 0.06 % nasal spray Place 2 sprays into both nostrils 2 (two) times daily.   acetaminophen (TYLENOL) 500 MG tablet Take 500 mg by mouth every 6 (six) hours as needed.   acyclovir (ZOVIRAX) 800 MG tablet Take 400 mg by mouth 2 (two) times daily.   allopurinol (ZYLOPRIM) 300 MG tablet Take 1 tablet (300 mg total) by mouth daily.   Alpha-Lipoic Acid 600 MG CAPS Take by mouth.   amoxicillin-clavulanate (AUGMENTIN) 875-125 MG tablet Take 1 tablet by mouth 2 (two) times daily.   Ascorbic Acid (VITAMIN C) 100 MG tablet Take 100 mg by mouth daily.   ASHWAGANDHA PO Take 300 mg by mouth 2 (two) times daily.   ASPIRIN 81 PO Take 1  tablet by mouth daily.   Azelastine HCl 137 MCG/SPRAY SOLN PLACE 1-2 SPRAYS INTO BOTH NOSTRILS 2 (TWO) TIMES DAILY AS NEEDED. USE IN EACH NOSTRIL AS DIRECTED   B Complex Vitamins (VITAMIN B COMPLEX PO)    Cholecalciferol (VITAMIN D) 2000 units CAPS Take 1 capsule by mouth daily.   co-enzyme Q-10 30 MG capsule Take 100 mg by mouth daily.   FOLIC ACID PO [id number] mcg.   gabapentin (NEURONTIN) 100 MG capsule Take 2-3 capsules (200-300 mg total) by mouth 2 (two) times daily.   GLUCOSAMINE-CHONDROITIN PO Take 300 mg by mouth 3 (three) times daily.   L-CITRULLINE PO Take by mouth.   LYSINE PO Take 1,000 mg by mouth.   Multiple Vitamin (MULTIVITAMIN) tablet Take 1 tablet by mouth daily.   Omega-3 Fatty Acids (FISH OIL) 1000 MG CAPS Take 1,000 mg by mouth. 1 by mouth daily   pantoprazole (PROTONIX) 40 MG tablet Take 1 tablet (40 mg total) by mouth in the morning.   Pyridoxine HCl (VITAMIN B6) 100 MG TABS    RESVERATROL PO Take by mouth. Patient takes bid   rosuvastatin (CRESTOR) 40 MG tablet Take 1 tablet (40 mg total) by mouth daily.   traMADol (ULTRAM) 50 MG tablet Take 1 tablet (50  mg total) by mouth daily. For chronic back pain   Turmeric Curcumin 500 MG CAPS Take 500 mg by mouth 3 (three) times daily.    Vitamin D-Vitamin K (D3 + K2 DOTS PO)    No facility-administered encounter medications on file as of 04/20/2022.    Allergies (verified) Patient has no known allergies.   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 LDL goal equal less than 160, ideally less than 130. No FH MI or CVA   Low back pain 03/13/2014   Chronic, has seen Dr Tamala Julian and Dr Lynann Bologna Taking tramadol as needed   Nonallopathic lesion of lumbosacral region 12/01/2015   Nonallopathic lesion of sacral region 12/01/2015   Nonallopathic lesion of thoracic region 12/01/2015   Prediabetes 03/29/2016   Mom, sister DM   PROSTATE CANCER, HX OF 03/12/2008   Qualifier: Diagnosis of  By: Linna Darner MD, [facility]   Radical prostatectomy 1998, Urologist Dr. Lawerance Bach    Past Surgical History:  Procedure Laterality Date   CATARACT EXTRACTION, BILATERAL  2017   colonoscopy with polypectomy  2014   Dr Sydnee Cabal [facility] SURGERY  2002   Dr Ellene Route   PROSTATECTOMY  1998   SHOULDER ARTHROSCOPY     right; 2011; left 1990   SIGMOIDOSCOPY     TONSILLECTOMY AND ADENOIDECTOMY     Family History  Problem Relation Age of Onset   Diabetes Mother    Dementia Mother    Diabetes Sister    Lung cancer Other        maternal uncle & aunt   Healthy Daughter    Colon cancer Neg Hx    Stomach cancer Neg Hx    Arthritis Neg Hx    Heart disease Neg Hx    Stroke Neg Hx    Esophageal cancer Neg Hx    Liver cancer Neg Hx    Pancreatic cancer Neg Hx    Rectal cancer Neg Hx    Social History   Socioeconomic History   Marital status: Married    Spouse name: Not on file   Number of children: Not on file   Years of education: Not on file   Highest education level: Not on file  Occupational History   Occupation: retired  Tobacco Use   Smoking status: Former    Types: Cigarettes    Quit date: 01/31/1985    Years since quitting: 37.2   Smokeless tobacco: Never   Tobacco comments:    smoked  [place], up  to 2.5 ppd  Vaping Use   Vaping Use: Never used  Substance and Sexual Activity   Alcohol use: No  Comment:  none since 2012   Drug use: No   Sexual activity: Yes  Other Topics Concern   Not on file  Social History Narrative   Exercises regularly - gym 5 days a week   Social Determinants of Health   Financial Resource Strain: Low Risk  (04/20/2022)   Overall Financial Resource Strain (CARDIA)    Difficulty of Paying Living Expenses: Not hard at all  Food Insecurity: No Food Insecurity (04/20/2022)   Hunger Vital Sign    Worried About Running Out of Food in the Last Year: Never true    Ran Out of Food in the Last Year: Never true  Transportation Needs: No Transportation Needs (04/20/2022)   PRAPARE - Hydrologist (Medical): No    Lack of Transportation (Non-Medical): No  Physical Activity: Sufficiently Active (04/20/2022)   Exercise Vital Sign    Days of Exercise per Week: 5 days    Minutes of Exercise per Session: 30 min  Stress: No Stress Concern Present (04/20/2022)   [place]    Feeling of Stress : Not at all  Social Connections: [place] (04/20/2022)   Social Connection and Isolation Panel [NHANES]    Frequency of Communication with Friends and Family: More than three times a week    Frequency of Social Gatherings with Friends and Family: More than three times a week    Attends Religious Services: More than 4 times per year    Active Member of [employer] or Organizations: Yes    Attends Music therapist: More than 4 times per year    Marital Status: Married    Tobacco Counseling Counseling given: Not Answered Tobacco comments: smoked  [place], up to 2.5 ppd   Clinical Intake:  Pre-visit preparation completed: Yes  Pain : No/denies pain Pain Score: 0-No pain     Nutritional Risks: None Diabetes: No  How often do you need to have someone help  you when you read instructions, pamphlets, or other written materials from your doctor or pharmacy?: 1 - Never What is the last grade level you completed in school?: HSG  Diabetic? No  Interpreter Needed?: No  Information entered by :: Lisette Abu, LPN.   Activities of Daily Living    04/20/2022    3:10 PM  In your present state of health, do you have any difficulty performing the following activities:  Hearing? 1  Comment wears hearing aids  Vision? 0  Difficulty concentrating or making decisions? 0  Walking or climbing stairs? 0  Dressing or bathing? 0  Doing errands, shopping? 0  Preparing Food and eating ? N  Using the Toilet? N  In the past six months, have you accidently leaked urine? N  Do you have problems with loss of bowel control? N  Managing your Medications? N  Managing your Finances? N  Housekeeping or managing your Housekeeping? N    Patient Care Team: Binnie Rail, MD as PCP - General (Internal Medicine) Stanford Breed Denice Bors, MD as PCP - Cardiology (Cardiology) Clent Jacks, MD as Consulting Physician (Ophthalmology)  Indicate any recent Medical Services you may have received from other than [facility] providers in the past year (date may be approximate).     Assessment:   This is a routine wellness examination for Daeshaun.  Hearing/Vision screen Hearing Screening - Comments:: Patient wears hearing aids. Vision Screening - Comments:: Wears rx glasses - up to date  with routine eye exams with Clent Jacks, MD.   Dietary issues and exercise activities discussed: Current Exercise Habits: Structured exercise class, Type of exercise: walking;treadmill;stretching;strength training/weights;exercise ball;calisthenics, Time (Minutes): 30, Frequency (Times/Week): 5, Weekly Exercise (Minutes/Week): 150, Intensity: Moderate   Goals Addressed             This Visit's Progress    Client will verbalize knowledge of diabetes self-management as evidenced by Hgb  A1C <7 or as defined by provider.       My goal is to keep my HgA1C down to below 5.7%      Depression Screen    04/20/2022    2:43 PM 10/20/2021    3:30 PM 03/03/2021    3:24 PM 01/30/2020    1:41 PM 01/06/2020    3:43 PM 04/10/2018    4:24 PM 03/31/2017    4:09 PM  PHQ 2/9 Scores  PHQ - 2 Score 0 0 0 0 0 0 0  PHQ- 9 Score 0      0    Fall Risk    04/20/2022    3:10 PM 10/20/2021    3:29 PM 03/03/2021    3:26 PM 01/30/2020    1:24 PM 01/06/2020    3:43 PM  [place] in the past year? 0 0 0 0 0  Number falls in past yr: 0 0 0 0 0  Injury with Fall? 0 0 0 0 0  Risk for fall due to : No Fall Risks No Fall Risks No Fall Risks No Fall Risks No Fall Risks  Follow up Falls prevention discussed Falls evaluation completed Falls evaluation completed Falls evaluation completed Falls evaluation completed    FALL RISK PREVENTION PERTAINING TO THE HOME:  Any stairs in or around the home? Yes  If so, are there any without handrails? No  Home free of loose throw rugs in walkways, pet beds, electrical cords, etc? Yes  Adequate lighting in your home to reduce risk of falls? Yes   ASSISTIVE DEVICES UTILIZED TO PREVENT FALLS:  Life alert? No  Use of a cane, walker or w/c? No  Grab bars in the bathroom? Yes  Shower chair or bench in shower? Yes  Elevated toilet seat or a handicapped toilet? Yes   TIMED UP AND GO:  Was the test performed? Yes .  Length of time to ambulate 10 feet: 8 sec.   Gait steady and fast without use of assistive device  Cognitive Function:        04/20/2022    3:10 PM  6CIT Screen  What Year? 0 points  What month? 0 points  What time? 0 points  Count back from 20 0 points  Months in reverse 0 points  Repeat phrase 0 points  Total Score 0 points    Immunizations Immunization History  Administered Date(s) Administered   Fluad Quad(high Dose 65+) 10/04/2018, 12/13/2021   Influenza Whole 1945/02/22, 10/08/2008, 11/08/2011   Influenza, High Dose  Seasonal PF 10/22/2012, 11/20/2015, 10/03/2017   Influenza,inj,Quad PF,6+ Mos 10/10/2014   Influenza-Unspecified 11/14/2016, 11/09/2020   PFIZER Comirnaty([place] Top)Covid-19 Tri-Sucrose Vaccine 01/15/2022   PFIZER(Purple Top)SARS-COV-2 Vaccination 02/22/2019, 03/15/2019, 11/06/2019, 06/18/2020   Pfizer Covid-19 Vaccine Bivalent Booster 40yrs & up 11/09/2020   Pneumococcal Conjugate-13 10/10/2014   Pneumococcal Polysaccharide-23 02/13/2013, 03/19/2020   Rsv, Mab, Kathie Rhodes, 0.5 Ml, Neonate To 24 Mos(Beyfortus) 12/13/2021   Td 03/12/2008   Tdap 08/27/2018   Zoster Recombinat (Shingrix) 05/27/2016, 08/31/2016   Zoster, Live 05/30/2006  TDAP status: Up to date  Flu Vaccine status: Up to date  Pneumococcal vaccine status: Up to date  Covid-19 vaccine status: Completed vaccines  Qualifies for Shingles Vaccine? Yes   Zostavax completed Yes   Shingrix Completed?: Yes  Screening Tests Health Maintenance  Topic Date Due   COVID-19 Vaccine (7 - 2023-24 season) 03/12/2022   COLONOSCOPY (Pts 45-31yrs Insurance coverage will need to be confirmed)  04/27/2022   Medicare Annual Wellness (AWV)  04/20/2023   DTaP/Tdap/Td (3 - Td or Tdap) 08/26/2028   Pneumonia Vaccine 85+ Years old  Completed   INFLUENZA VACCINE  Completed   Hepatitis C Screening  Completed   Zoster Vaccines- Shingrix  Completed   HPV VACCINES  Aged Out    Health Maintenance  Health Maintenance Due  Topic Date Due   COVID-19 Vaccine (7 - 2023-24 season) 03/12/2022    Colorectal cancer screening: Type of screening: Colonoscopy. Completed 04/26/2017. Repeat every 5 years  Lung Cancer Screening: (Low Dose CT Chest recommended if Age 64-80 years, 30 pack-year currently smoking OR have quit w/in 15years.) does not qualify.   Lung Cancer Screening Referral: no  Additional Screening:  Hepatitis C Screening: does qualify; Completed 10/10/2014  Vision Screening: Recommended annual ophthalmology exams for early  detection of glaucoma and other disorders of the eye. Is the patient up to date with their annual eye exam?  Yes  Who is the provider or what is the name of the office in which the patient attends annual eye exams? Clent Jacks, MD. If pt is not established with a provider, would they like to be referred to a provider to establish care? No .   Dental Screening: Recommended annual dental exams for proper oral hygiene  Community Resource Referral / Chronic Care Management: CRR required this visit?  No   CCM required this visit?  No      Plan:     I have personally reviewed and noted the following in the patient's chart:   Medical and social history Use of alcohol, tobacco or illicit drugs  Current medications and supplements including opioid prescriptions. Patient is not currently taking opioid prescriptions. Functional ability and status Nutritional status Physical activity Advanced directives List of other physicians Hospitalizations, surgeries, and ER visits in previous 12 months Vitals Screenings to include cognitive, depression, and falls Referrals and appointments  In addition, I have reviewed and discussed with patient certain preventive protocols, quality metrics, and best practice recommendations. A written personalized care plan for preventive services as well as general preventive health recommendations were provided to patient.     Sheral Flow, LPN   [id number]   Nurse Notes:  Normal cognitive status assessed by direct observation by this Nurse Health Advisor. No abnormalities found.

## 2022-04-20 NOTE — Assessment & Plan Note (Signed)
Chronic Homocystine level pending Continue folate, B12 and B6 supplementation

## 2022-04-21 DIAGNOSIS — R5383 Other fatigue: Secondary | ICD-10-CM | POA: Diagnosis not present

## 2022-04-21 DIAGNOSIS — I77810 Thoracic aortic ectasia: Secondary | ICD-10-CM | POA: Diagnosis not present

## 2022-04-21 DIAGNOSIS — N2581 Secondary hyperparathyroidism of renal origin: Secondary | ICD-10-CM | POA: Diagnosis not present

## 2022-04-21 DIAGNOSIS — N1831 Chronic kidney disease, stage 3a: Secondary | ICD-10-CM | POA: Diagnosis not present

## 2022-04-21 DIAGNOSIS — R7303 Prediabetes: Secondary | ICD-10-CM | POA: Diagnosis not present

## 2022-04-21 DIAGNOSIS — I5189 Other ill-defined heart diseases: Secondary | ICD-10-CM | POA: Diagnosis not present

## 2022-04-21 DIAGNOSIS — E7211 Homocystinuria: Secondary | ICD-10-CM | POA: Diagnosis not present

## 2022-04-21 DIAGNOSIS — E785 Hyperlipidemia, unspecified: Secondary | ICD-10-CM | POA: Diagnosis not present

## 2022-04-21 DIAGNOSIS — M109 Gout, unspecified: Secondary | ICD-10-CM | POA: Diagnosis not present

## 2022-04-21 DIAGNOSIS — K2270 Barrett's esophagus without dysplasia: Secondary | ICD-10-CM | POA: Diagnosis not present

## 2022-04-22 LAB — HOMOCYSTEINE: Homocysteine: 11.3 umol/L (ref <11.4)

## 2022-05-07 ENCOUNTER — Other Ambulatory Visit: Payer: Self-pay | Admitting: Gastroenterology

## 2022-06-02 DIAGNOSIS — R972 Elevated prostate specific antigen [PSA]: Secondary | ICD-10-CM | POA: Diagnosis not present

## 2022-06-02 DIAGNOSIS — C61 Malignant neoplasm of prostate: Secondary | ICD-10-CM | POA: Diagnosis not present

## 2022-06-02 DIAGNOSIS — N529 Male erectile dysfunction, unspecified: Secondary | ICD-10-CM | POA: Diagnosis not present

## 2022-06-03 ENCOUNTER — Ambulatory Visit: Payer: Medicare Other | Admitting: Internal Medicine

## 2022-06-10 ENCOUNTER — Ambulatory Visit (INDEPENDENT_AMBULATORY_CARE_PROVIDER_SITE_OTHER): Payer: Medicare Other | Admitting: Internal Medicine

## 2022-06-10 ENCOUNTER — Encounter: Payer: Self-pay | Admitting: Internal Medicine

## 2022-06-10 VITALS — BP 126/80 | HR 61 | Temp 98.4°F | Ht 71.0 in | Wt 176.0 lb

## 2022-06-10 DIAGNOSIS — Z86010 Personal history of colonic polyps: Secondary | ICD-10-CM

## 2022-06-10 NOTE — Assessment & Plan Note (Signed)
History of colon polyps Due for colonoscopy-does need most likely 1 more colonoscopy-referral ordered

## 2022-06-10 NOTE — Progress Notes (Signed)
Subjective:    Patient ID: Tom Gaines, male    DOB: [birth year], 77 y.o.   MRN: [id number]      HPI Tom Gaines is here for  Chief Complaint  Patient presents with   Medical Management of Chronic Issues     Here because he is due for colonoscopy-thought he needed to come and see me.    Medications and allergies reviewed with patient and updated if appropriate.  Current Outpatient Medications on File Prior to Visit  Medication Sig Dispense Refill   acetaminophen (TYLENOL) 500 MG tablet Take 500 mg by mouth every 6 (six) hours as needed.     acyclovir (ZOVIRAX) 800 MG tablet Take 400 mg by mouth 2 (two) times daily.     allopurinol (ZYLOPRIM) 300 MG tablet Take 1 tablet (300 mg total) by mouth daily. 90 tablet 1   Alpha-Lipoic Acid 600 MG CAPS Take by mouth.     amoxicillin-clavulanate (AUGMENTIN) 875-125 MG tablet Take 1 tablet by mouth 2 (two) times daily. 14 tablet 0   Ascorbic Acid (VITAMIN C) 100 MG tablet Take 100 mg by mouth daily.     ASHWAGANDHA PO Take 300 mg by mouth 2 (two) times daily.     ASPIRIN 81 PO Take 1 tablet by mouth daily.     Azelastine HCl 137 MCG/SPRAY SOLN PLACE 1-2 SPRAYS INTO BOTH NOSTRILS 2 (TWO) TIMES DAILY AS NEEDED. USE IN EACH NOSTRIL AS DIRECTED 30 mL 3   B Complex Vitamins (VITAMIN B COMPLEX PO)      Cholecalciferol (VITAMIN D) 2000 units CAPS Take 1 capsule by mouth daily.     co-enzyme Q-10 30 MG capsule Take 100 mg by mouth daily.     FOLIC ACID PO 4,098 mcg.     gabapentin (NEURONTIN) 100 MG capsule Take 2-3 capsules (200-300 mg total) by mouth 2 (two) times daily. 180 capsule 5   GLUCOSAMINE-CHONDROITIN PO Take 300 mg by mouth 3 (three) times daily.     ipratropium (ATROVENT) 0.06 % nasal spray Place 2 sprays into both nostrils 2 (two) times daily.     L-CITRULLINE PO Take by mouth.     LYSINE PO Take 1,000 mg by mouth.     Multiple Vitamin (MULTIVITAMIN) tablet Take 1 tablet by mouth daily.     Omega-3 Fatty Acids (FISH OIL) 1000  MG CAPS Take 1,000 mg by mouth. 1 by mouth daily     Pyridoxine HCl (VITAMIN B6) 100 MG TABS      RESVERATROL PO Take by mouth. Patient takes bid     traMADol (ULTRAM) 50 MG tablet Take 1 tablet (50 mg total) by mouth daily. For chronic back pain 90 tablet 0   Turmeric Curcumin 500 MG CAPS Take 500 mg by mouth 3 (three) times daily.      Vitamin D-Vitamin K (D3 + K2 DOTS PO)      pantoprazole (PROTONIX) 40 MG tablet TAKE 1 TABLET (40 MG TOTAL) BY MOUTH IN THE MORNING 90 tablet 0   rosuvastatin (CRESTOR) 40 MG tablet Take 1 tablet (40 mg total) by mouth daily. 90 tablet 3   No current facility-administered medications on file prior to visit.    Review of Systems     Objective:   Vitals:   06/10/22 1544  BP: 126/80  Pulse: 61  Temp: 98.4 F (36.9 C)  SpO2: 96%   BP Readings from Last 3 Encounters:  06/10/22 126/80  04/20/22 118/60  04/20/22 118/60  Wt Readings from Last 3 Encounters:  06/10/22 176 lb (79.8 kg)  04/20/22 174 lb (78.9 kg)  04/20/22 174 lb 3.2 oz (79 kg)   Body mass index is 24.55 kg/m.    Physical Exam         Assessment & Plan:    See Problem List for Assessment and Plan of chronic medical problems.

## 2022-06-10 NOTE — Patient Instructions (Signed)
      A referral was ordered for GI for your colonoscopy and someone will call you to schedule an appointment.   You can also call them to schedule if you like which is quicker - their phone number is -  [phone]

## 2022-06-16 ENCOUNTER — Other Ambulatory Visit: Payer: Self-pay | Admitting: Cardiology

## 2022-06-16 DIAGNOSIS — E7800 Pure hypercholesterolemia, unspecified: Secondary | ICD-10-CM

## 2022-06-22 ENCOUNTER — Telehealth: Payer: Self-pay | Admitting: Gastroenterology

## 2022-06-22 DIAGNOSIS — Q438 Other specified congenital malformations of intestine: Secondary | ICD-10-CM

## 2022-06-22 DIAGNOSIS — K5750 Diverticulosis of both small and large intestine without perforation or abscess without bleeding: Secondary | ICD-10-CM

## 2022-06-22 DIAGNOSIS — K639 Disease of intestine, unspecified: Secondary | ICD-10-CM

## 2022-06-22 NOTE — Telephone Encounter (Signed)
Virtual colon has been entered [employer] called and they will call the pt to set up this week. The pt has been advised

## 2022-06-22 NOTE — Telephone Encounter (Signed)
Please schedule VC as planned

## 2022-06-22 NOTE — Telephone Encounter (Signed)
Dr Russella Dar see note from pt. Ok to set up virtual colon? I see a recall was entered for virtual colon.

## 2022-06-22 NOTE — Telephone Encounter (Signed)
PT needs a virtual colonoscopy scheduled. Please advise.

## 2022-06-23 DIAGNOSIS — L814 Other melanin hyperpigmentation: Secondary | ICD-10-CM | POA: Diagnosis not present

## 2022-06-23 DIAGNOSIS — D225 Melanocytic nevi of trunk: Secondary | ICD-10-CM | POA: Diagnosis not present

## 2022-06-23 DIAGNOSIS — L7211 Pilar cyst: Secondary | ICD-10-CM | POA: Diagnosis not present

## 2022-06-23 DIAGNOSIS — I788 Other diseases of capillaries: Secondary | ICD-10-CM | POA: Diagnosis not present

## 2022-06-23 DIAGNOSIS — D2372 Other benign neoplasm of skin of left lower limb, including hip: Secondary | ICD-10-CM | POA: Diagnosis not present

## 2022-06-23 DIAGNOSIS — L821 Other seborrheic keratosis: Secondary | ICD-10-CM | POA: Diagnosis not present

## 2022-06-23 DIAGNOSIS — D2362 Other benign neoplasm of skin of left upper limb, including shoulder: Secondary | ICD-10-CM | POA: Diagnosis not present

## 2022-06-23 DIAGNOSIS — Z85828 Personal history of other malignant neoplasm of skin: Secondary | ICD-10-CM | POA: Diagnosis not present

## 2022-06-23 DIAGNOSIS — D1801 Hemangioma of skin and subcutaneous tissue: Secondary | ICD-10-CM | POA: Diagnosis not present

## 2022-06-23 DIAGNOSIS — L570 Actinic keratosis: Secondary | ICD-10-CM | POA: Diagnosis not present

## 2022-06-23 DIAGNOSIS — L820 Inflamed seborrheic keratosis: Secondary | ICD-10-CM | POA: Diagnosis not present

## 2022-07-12 DIAGNOSIS — Z961 Presence of intraocular lens: Secondary | ICD-10-CM | POA: Diagnosis not present

## 2022-07-12 DIAGNOSIS — H31091 Other chorioretinal scars, right eye: Secondary | ICD-10-CM | POA: Diagnosis not present

## 2022-07-12 DIAGNOSIS — H04123 Dry eye syndrome of bilateral lacrimal glands: Secondary | ICD-10-CM | POA: Diagnosis not present

## 2022-07-12 DIAGNOSIS — H11132 Conjunctival pigmentations, left eye: Secondary | ICD-10-CM | POA: Diagnosis not present

## 2022-07-12 DIAGNOSIS — H26492 Other secondary cataract, left eye: Secondary | ICD-10-CM | POA: Diagnosis not present

## 2022-07-12 DIAGNOSIS — D3131 Benign neoplasm of right choroid: Secondary | ICD-10-CM | POA: Diagnosis not present

## 2022-07-12 LAB — HM DIABETES EYE EXAM

## 2022-07-13 ENCOUNTER — Encounter: Payer: Self-pay | Admitting: Internal Medicine

## 2022-07-23 ENCOUNTER — Other Ambulatory Visit: Payer: Self-pay | Admitting: Gastroenterology

## 2022-08-01 ENCOUNTER — Other Ambulatory Visit: Payer: Medicare Other

## 2022-08-02 ENCOUNTER — Other Ambulatory Visit: Payer: Medicare Other

## 2022-08-04 ENCOUNTER — Encounter: Payer: Self-pay | Admitting: Internal Medicine

## 2022-08-05 MED ORDER — DIAZEPAM 5 MG PO TABS: 5.0000 mg | ORAL_TABLET | Freq: Two times a day (BID) | ORAL | 0 refills | Status: DC | PRN

## 2022-08-11 ENCOUNTER — Encounter (HOSPITAL_COMMUNITY): Payer: Self-pay

## 2022-08-11 ENCOUNTER — Ambulatory Visit (HOSPITAL_COMMUNITY)
Admission: RE | Admit: 2022-08-11 | Discharge: 2022-08-11 | Disposition: A | Discharge status: Home / Self Care | Payer: Medicare Other | Source: Ambulatory Visit | Attending: Internal Medicine | Admitting: Internal Medicine

## 2022-08-11 VITALS — BP 121/66 | HR 71 | Temp 98.5°F | Resp 16 | Ht 71.0 in | Wt 170.0 lb

## 2022-08-11 DIAGNOSIS — Z1152 Encounter for screening for COVID-19: Secondary | ICD-10-CM | POA: Insufficient documentation

## 2022-08-11 DIAGNOSIS — J029 Acute pharyngitis, unspecified: Secondary | ICD-10-CM | POA: Insufficient documentation

## 2022-08-11 NOTE — ED Provider Notes (Signed)
[facility]    CSN: [id number] Arrival date & time: 08/11/22  1121      History   Chief Complaint Chief Complaint  Patient presents with   Sore Throat    Entered by patient   Appointment    HPI Tom Gaines is a 77 y.o. male.   Patient presents to urgent care for evaluation of sore throat and bilateral ear pain that started yesterday. Throat pain is worsened by swallowing and currently 2/10.  Denies cough, nasal congestion, headache, dizziness, N/V/D, abdominal pain, rash, recent antibiotic/steroid use, and recent known sick contacts with similar symptoms.  Denies history of chronic respiratory problems.  No COVID-19 confirmed infection in the last 90 days.  No shortness of breath, chest pain, heart palpitations, difficulty maintaining secretions, or fever/chills.  Former smoker.  Quit almost 40 years ago.  Using Tylenol as needed for throat pain with small amount of relief.  States salt water gargles helps with throat pain more than Tylenol.   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 LDL goal equal less than 160, ideally less than 130. No FH MI or CVA   Low back pain 03/13/2014   Chronic, has seen Dr Katrinka Blazing and Dr Charlett Blake Taking  tramadol as needed   Nonallopathic lesion of lumbosacral region 12/01/2015   Nonallopathic lesion of sacral region 12/01/2015   Nonallopathic lesion of thoracic region 12/01/2015   Prediabetes 03/29/2016   Mom, sister DM   PROSTATE CANCER, HX OF 03/12/2008   Qualifier: Diagnosis of  By: Alwyn Ren MD, [facility]   Radical prostatectomy 1998, Urologist Dr. 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   Nasal sore 08/27/2018   Bilateral shoulder pain 10/03/2017   CKD (chronic kidney disease) 03/31/2017   Bilateral shoulder bursitis 02/01/2017   Elevated prostate specific antigen (PSA) 07/11/2016   Prediabetes 03/29/2016   Nonallopathic lesion of lumbosacral region 12/01/2015   Nonallopathic lesion of sacral region 12/01/2015   Nonallopathic lesion of thoracic region 12/01/2015   Malignant neoplasm of prostate (HCC) 12/01/2014   Low back pain 03/13/2014   Basal cell carcinoma 02/13/2013   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 Authorizing Provider  acetaminophen (TYLENOL) 500 MG tablet Take 500 mg by mouth every 6 (six) hours as needed.   Yes [provider]  acyclovir (ZOVIRAX) 800 MG tablet Take 400 mg by mouth 2 (two) times daily. 09/04/20  Yes [provider]  allopurinol (ZYLOPRIM) 300 MG tablet Take 1 tablet (300 mg total) by mouth daily. 04/20/22  Yes Burns, Bobette Mo, MD  Alpha-Lipoic Acid 600 MG CAPS Take by mouth.   Yes [provider]  Ascorbic Acid (VITAMIN C) 100 MG tablet Take 100 mg by mouth daily.   Yes [provider]  ASHWAGANDHA PO Take 300 mg by mouth 2 (two) times daily.   Yes [provider]  ASPIRIN 81 PO Take 1 tablet by mouth daily.   Yes [provider]  Azelastine HCl 137 MCG/SPRAY SOLN PLACE 1-2 SPRAYS INTO BOTH NOSTRILS 2 (TWO) TIMES DAILY AS NEEDED. USE IN EACH NOSTRIL AS DIRECTED 12/28/20  Yes Ellamae Sia, DO  B Complex Vitamins (VITAMIN B COMPLEX PO)    Yes [provider]  Cholecalciferol (VITAMIN D) 2000 units CAPS Take 1 capsule by mouth daily.   Yes [provider]  co-enzyme Q-10 30 MG capsule Take 100 mg by mouth daily.   Yes [provider]  diazepam (VALIUM) 5 MG tablet Take 1 tablet (5 mg total) by mouth every 12 (twelve) hours as needed for anxiety. 08/05/22  Yes Burns, Bobette Mo, MD  FOLIC ACID PO 1,610 mcg.   Yes [provider]  gabapentin (NEURONTIN) 100 MG capsule Take 2-3 capsules (200-300 mg total) by mouth 2 (two) times daily. 03/03/22  Yes Burns, Bobette Mo, MD  GLUCOSAMINE-CHONDROITIN PO Take 300 mg by mouth 3 (three) times daily.   Yes [provider]  ipratropium (ATROVENT) 0.06 % nasal spray Place 2 sprays into both nostrils 2 (two) times daily. 02/25/22  Yes [provider]  L-CITRULLINE PO Take by mouth.   Yes [provider]  LYSINE PO Take 1,000 mg by mouth.   Yes [provider]   Multiple Vitamin (MULTIVITAMIN) tablet Take 1 tablet by mouth daily.   Yes [provider]  Omega-3 Fatty Acids (FISH OIL) 1000 MG CAPS Take 1,000 mg by mouth. 1 by mouth daily   Yes [provider]  Pyridoxine HCl (VITAMIN B6) 100 MG TABS    Yes [provider]  RESVERATROL PO Take by mouth. Patient takes bid   Yes [provider]  rosuvastatin (CRESTOR) 40 MG tablet TAKE 1 TABLET BY MOUTH EVERY DAY 06/16/22  Yes Crenshaw, Madolyn Frieze, MD  traMADol (ULTRAM) 50 MG tablet Take 1 tablet (50 mg total) by mouth daily. For chronic back pain 03/14/22  Yes Burns, Bobette Mo, MD  Turmeric Curcumin 500 MG CAPS Take 500 mg by mouth 3 (three) times daily.  Yes [provider]  Vitamin D-Vitamin K (D3 + K2 DOTS PO)    Yes [provider]  pantoprazole (PROTONIX) 40 MG tablet TAKE 1 TABLET (40 MG TOTAL) BY MOUTH IN THE MORNING 05/09/22 06/08/22  Meryl Dare, MD    Family History Family History  Problem Relation Age of Onset   Diabetes Mother    Dementia Mother    Diabetes Sister    Lung cancer Other        maternal uncle & aunt   Healthy Daughter    Colon cancer Neg Hx    Stomach cancer Neg Hx    Arthritis Neg Hx    Heart disease Neg Hx    Stroke Neg Hx    Esophageal cancer Neg Hx    Liver cancer Neg Hx    Pancreatic cancer Neg Hx    Rectal cancer Neg Hx     Social History Social History   Tobacco Use   Smoking status: Former    Current packs/day: 0.00    Types: Cigarettes    Quit date: 01/31/1985    Years since quitting: 37.5   Smokeless tobacco: Never   Tobacco comments:    smoked  1965-   1987, up to 2.5 ppd  Vaping Use   Vaping status: Never Used  Substance Use Topics   Alcohol use: No    Comment:  none since 2012   Drug use: No     Allergies   Patient has no known allergies.   Review of Systems Review of Systems Per HPI  Physical Exam Triage Vital Signs ED Triage Vitals  Encounter Vitals Group     BP 08/11/22 1147  121/66     Systolic BP Percentile --      Diastolic BP Percentile --      Pulse Rate 08/11/22 1147 71     Resp 08/11/22 1147 16     Temp 08/11/22 1147 98.5 F (36.9 C)     Temp Source 08/11/22 1147 Oral     SpO2 08/11/22 1147 92 %     Weight 08/11/22 1146 170 lb (77.1 kg)     Height 08/11/22 1146 5\' 11"  (1.803 m)     Head Circumference --      Peak Flow --      Pain Score 08/11/22 1145 2     Pain Loc --      Pain Education --      Exclude from Growth Chart --    No data found.  Updated Vital Signs BP 121/66 (BP Location: Left Arm)   Pulse 71   Temp 98.5 F (36.9 C) (Oral)   Resp 16   Ht 5\' 11"  (1.803 m)   Wt 170 lb (77.1 kg)   SpO2 92%   BMI 23.71 kg/m   Visual Acuity Right Eye Distance:   Left Eye Distance:   Bilateral Distance:    Right Eye Near:   Left Eye Near:    Bilateral Near:     Physical Exam Vitals and nursing note reviewed.  Constitutional:      Appearance: He is not ill-appearing or toxic-appearing.  HENT:     Head: Normocephalic and atraumatic.     Right Ear: Hearing, tympanic membrane, ear canal and external ear normal.     Left Ear: Hearing, tympanic membrane, ear canal and external ear normal.     Nose: Nose normal.     Mouth/Throat:     Lips: Pink.  Mouth: Mucous membranes are moist. No injury.     Tongue: No lesions. Tongue does not deviate from midline.     Palate: No mass and lesions.     Pharynx: Oropharynx is clear. Uvula midline. No pharyngeal swelling, oropharyngeal exudate, posterior oropharyngeal erythema or uvula swelling.     Tonsils: No tonsillar exudate or tonsillar abscesses.  Eyes:     General: Lids are normal. Vision grossly intact. Gaze aligned appropriately.     Extraocular Movements: Extraocular movements intact.     Conjunctiva/sclera: Conjunctivae normal.  Cardiovascular:     Rate and Rhythm: Normal rate and regular rhythm.     Heart sounds: Normal heart sounds, S1 normal and S2 normal.  Pulmonary:     Effort:  Pulmonary effort is normal. No respiratory distress.     Breath sounds: Normal breath sounds and air entry.  Musculoskeletal:     Cervical back: Neck supple.  Skin:    General: Skin is warm and dry.     Capillary Refill: Capillary refill takes less than 2 seconds.     Findings: No rash.  Neurological:     General: No focal deficit present.     Mental Status: He is alert and oriented to person, place, and time. Mental status is at baseline.     Cranial Nerves: No dysarthria or facial asymmetry.  Psychiatric:        Mood and Affect: Mood normal.        Speech: Speech normal.        Behavior: Behavior normal.        Thought Content: Thought content normal.        Judgment: Judgment normal.      UC Treatments / Results  Labs (all labs ordered are listed, but only abnormal results are displayed) Labs Reviewed  SARS CORONAVIRUS 2 (TAT 6-24 HRS)    EKG   Radiology No results found.  Procedures Procedures (including critical care time)  Medications Ordered in UC Medications - No data to display  Initial Impression / Assessment and Plan / UC Course  I have reviewed the triage vital signs and the nursing notes.  Pertinent labs & imaging results that were available during my care of the patient were reviewed by me and considered in my medical decision making (see chart for details).   1.  Viral pharyngitis Evaluation suggests viral URI etiology. Will manage this with recommendations for OTC and prescription medications for symptomatic relief. Encouraged to push fluids to stay well hydrated.  Imaging: deferred based on stable cardiopulmonary exam/hemodynamically stable vital signs Viral testing: COVID-19 testing is pending, will call patient if positive. He may have antiviral if positive for COVID-19. Discussed masking updated and CDC guidelines.  Discussed red flag signs and symptoms of worsening condition,when to call the PCP office, return to urgent care, and when to seek  higher level of care in the emergency department. Counseled patient regarding appropriate use of medications and potential side effects for all medications recommended or prescribed today. Patient verbalizes understanding and agreement with plan. Discharged in stable condition.     Final Clinical Impressions(s) / UC Diagnoses   Final diagnoses:  Viral pharyngitis     Discharge Instructions      Your strep testing of your throat in the clinic is negative. Throat culture has been sent to the lab to further check for bacteria to the back of your throat, staff will call you if this is positive in the next 2-3 days.  Use the following medicines to help with symptoms: - Ibuprofen 600mg  and/or Tylenol 1,000mg  every 6 hours with food as needed for aches/pains or fever/chills.  - 1 tablespoon of honey in warm water - Gargle with the following: 1/2 teaspoon salt and 1/4 teaspoon baking soda in 1 cup of warm water  COVID test is pending and staff will call you if this is positive in the next 12-24 hours.   If you develop any new or worsening symptoms, please return.  If your symptoms are severe, please go to the emergency room.  Follow-up with your primary care provider for further evaluation and management of your symptoms as well as ongoing wellness visits.  I hope you feel better!      ED Prescriptions   None    PDMP not reviewed this encounter.   Carlisle Beers, [place] 08/11/22 1223

## 2022-08-11 NOTE — ED Triage Notes (Signed)
Patient having sore throat, and ear pressure onset last night. Used a salt water gargle with mild relief.

## 2022-08-11 NOTE — Discharge Instructions (Signed)
Your strep testing of your throat in the clinic is negative. Throat culture has been sent to the lab to further check for bacteria to the back of your throat, staff will call you if this is positive in the next 2-3 days.   Use the following medicines to help with symptoms: - Ibuprofen 600mg  and/or Tylenol 1,000mg  every 6 hours with food as needed for aches/pains or fever/chills.  - 1 tablespoon of honey in warm water - Gargle with the following: 1/2 teaspoon salt and 1/4 teaspoon baking soda in 1 cup of warm water  COVID test is pending and staff will call you if this is positive in the next 12-24 hours.   If you develop any new or worsening symptoms, please return.  If your symptoms are severe, please go to the emergency room.  Follow-up with your primary care provider for further evaluation and management of your symptoms as well as ongoing wellness visits.  I hope you feel better!

## 2022-08-12 LAB — SARS CORONAVIRUS 2 (TAT 6-24 HRS): SARS Coronavirus 2: NEGATIVE

## 2022-08-15 ENCOUNTER — Telehealth: Payer: Medicare Other | Admitting: Family Medicine

## 2022-08-15 ENCOUNTER — Ambulatory Visit (HOSPITAL_COMMUNITY)
Admission: EM | Admit: 2022-08-15 | Discharge: 2022-08-15 | Disposition: A | Discharge status: Home / Self Care | Payer: Medicare Other | Attending: Physician Assistant | Admitting: Physician Assistant

## 2022-08-15 ENCOUNTER — Encounter (HOSPITAL_COMMUNITY): Payer: Self-pay | Admitting: Emergency Medicine

## 2022-08-15 DIAGNOSIS — R066 Hiccough: Secondary | ICD-10-CM

## 2022-08-15 DIAGNOSIS — K2270 Barrett's esophagus without dysplasia: Secondary | ICD-10-CM

## 2022-08-15 MED ORDER — BACLOFEN 10 MG PO TABS: 10.0000 mg | ORAL_TABLET | Freq: Three times a day (TID) | ORAL | 0 refills | Status: DC | PRN

## 2022-08-15 NOTE — Progress Notes (Signed)
Because since you have a history of barrett's and nothing is currently helping, you continue to have trouble swallowing- you need to be seen in person at a urgent care unless your GI or PCP can see you today. I feel your condition warrants further evaluation and I recommend that you be seen in a face to face visit.   NOTE: There will be NO CHARGE for this eVisit

## 2022-08-15 NOTE — ED Provider Notes (Signed)
[facility]    CSN: [id number] Arrival date & time: 08/15/22  1847      History   Chief Complaint Chief Complaint  Patient presents with   Hiccups    HPI Tom Gaines is a 77 y.o. male.   Patient complains of hiccups that started 5 days ago.  He has a history of Barrett's esophagitis currently on Protonix.  Denies acid reflux at this time, denies vomiting, nausea, abdominal pain.  Patient reports he has been able to sleep but hiccups are persistent throughout the day.    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 LDL goal equal less than 160, ideally less than 130. No FH MI or CVA   Low back pain 03/13/2014   Chronic, has seen Dr Katrinka Blazing and Dr Charlett Blake Taking tramadol as needed   Nonallopathic lesion of lumbosacral region 12/01/2015   Nonallopathic lesion of sacral region 12/01/2015   Nonallopathic lesion of thoracic region 12/01/2015   Prediabetes 03/29/2016   Mom, sister DM   PROSTATE CANCER, HX OF 03/12/2008   Qualifier: Diagnosis of  By: Alwyn Ren MD, [facility]   Radical prostatectomy 1998, Urologist Dr. 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   Nasal sore 08/27/2018   Bilateral shoulder pain 10/03/2017   CKD (chronic kidney disease) 03/31/2017   Bilateral shoulder bursitis 02/01/2017   Elevated prostate specific antigen (PSA) 07/11/2016   Prediabetes 03/29/2016   Nonallopathic lesion of lumbosacral region 12/01/2015   Nonallopathic lesion of sacral region 12/01/2015   Nonallopathic lesion of thoracic region 12/01/2015   Malignant neoplasm of prostate (HCC) 12/01/2014   Low back pain 03/13/2014   Basal cell carcinoma 02/13/2013   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 Authorizing Provider  baclofen (LIORESAL) 10 MG tablet Take 1 tablet (10 mg total) by mouth 3 (three) times daily as needed for muscle spasms. 08/15/22  Yes Ward, Tylene Fantasia, PA-C  acetaminophen (TYLENOL) 500 MG tablet Take 500 mg by mouth every 6 (six) hours as needed.    [provider]  acyclovir (ZOVIRAX) 800 MG tablet Take 400 mg by mouth 2 (two) times daily. 09/04/20   [provider]  allopurinol (ZYLOPRIM) 300  MG tablet Take 1 tablet (300 mg total) by mouth daily. 04/20/22   Pincus Sanes, MD  Alpha-Lipoic Acid 600 MG CAPS Take by mouth.    [provider]  Ascorbic Acid (VITAMIN C) 100 MG tablet Take 100 mg by mouth daily.    [provider]  ASHWAGANDHA PO Take 300 mg by mouth 2 (two) times daily.    [provider]  ASPIRIN 81 PO Take 1 tablet by mouth daily.    [provider]  Azelastine HCl 137 MCG/SPRAY SOLN PLACE 1-2 SPRAYS INTO BOTH NOSTRILS 2 (TWO) TIMES DAILY AS NEEDED. USE IN [facility] NOSTRIL AS DIRECTED 12/28/20   Ellamae Sia, DO  B Complex Vitamins (VITAMIN B COMPLEX PO)     [provider]  Cholecalciferol (VITAMIN D) 2000 units CAPS Take 1 capsule by mouth daily.    [provider]  co-enzyme Q-10 30 MG capsule Take 100 mg by mouth daily.    [provider]  diazepam (VALIUM) 5 MG tablet Take 1 tablet (5 mg total) by mouth every 12 (twelve) hours as needed for anxiety. 08/05/22   Pincus Sanes, MD  FOLIC ACID PO 6,213 mcg.    [provider]  gabapentin (NEURONTIN) 100 MG capsule Take 2-3 capsules (200-300 mg total) by mouth 2 (two) times daily. 03/03/22   Pincus Sanes, MD  GLUCOSAMINE-CHONDROITIN PO Take 300 mg by mouth 3 (three) times daily.    [provider]  ipratropium (ATROVENT) 0.06 % nasal spray Place 2 sprays into both nostrils 2 (two) times daily. 02/25/22   [provider]  L-CITRULLINE PO Take by mouth.    [provider]  LYSINE PO Take 1,000 mg by mouth.    [provider]  Multiple Vitamin (MULTIVITAMIN) tablet Take 1 tablet by mouth daily.    [provider]  Omega-3 Fatty Acids (FISH OIL) 1000 MG CAPS Take 1,000 mg by mouth. 1 by mouth daily    [provider]  pantoprazole (PROTONIX) 40 MG tablet TAKE 1 TABLET (40 MG TOTAL) BY MOUTH IN THE MORNING 05/09/22 06/08/22  Meryl Dare, MD  Pyridoxine HCl (VITAMIN B6) 100 MG TABS     [provider]   RESVERATROL PO Take by mouth. Patient takes bid    [provider]  rosuvastatin (CRESTOR) 40 MG tablet TAKE 1 TABLET BY MOUTH EVERY DAY 06/16/22   Lewayne Bunting, MD  traMADol (ULTRAM) 50 MG tablet Take 1 tablet (50 mg total) by mouth daily. For chronic back pain 03/14/22   Pincus Sanes, MD  Turmeric Curcumin 500 MG CAPS Take 500 mg by mouth 3 (three) times daily.     [provider]  Vitamin D-Vitamin K (D3 + K2 DOTS PO)     [provider]    Family History Family History  Problem Relation Age of Onset   Diabetes Mother    Dementia Mother    Diabetes Sister    Lung cancer Other        maternal uncle & aunt   Healthy Daughter    Colon cancer Neg Hx    Stomach cancer Neg Hx    Arthritis Neg Hx    Heart disease Neg Hx    Stroke Neg Hx    Esophageal cancer Neg Hx    Liver cancer Neg Hx    Pancreatic cancer Neg Hx    Rectal cancer Neg Hx     Social History Social History   Tobacco Use   Smoking status: Former    Current packs/day: 0.00    Types: Cigarettes    Quit date: 01/31/1985    Years since quitting: 37.5   Smokeless tobacco: Never   Tobacco comments:    smoked  1965-   1987, up to 2.5 ppd  Vaping Use   Vaping status: Never Used  Substance Use Topics   Alcohol use: No    Comment:  none since 2012   Drug use: No     Allergies   Patient has no known allergies.   Review of Systems Review of Systems  Constitutional:  Negative for chills and fever.  HENT:  Negative for ear pain and sore throat.   Eyes:  Negative for pain and visual disturbance.  Respiratory:  Negative for cough and shortness of breath.   Cardiovascular:  Negative for chest pain and palpitations.  Gastrointestinal:  Negative for abdominal pain and vomiting.  Genitourinary:  Negative for dysuria and hematuria.  Musculoskeletal:  Negative for arthralgias and back pain.  Skin:  Negative for color change and rash.  Neurological:  Negative for seizures and syncope.   All other systems reviewed and are negative.    Physical Exam Triage Vital Signs ED Triage Vitals  Encounter Vitals Group     BP 08/15/22 1931 135/83     Systolic BP Percentile --      Diastolic BP Percentile --      Pulse Rate 08/15/22 1931 84     Resp 08/15/22 1931 17     Temp 08/15/22 1931 98.6 F (37 C)     Temp src --      SpO2 08/15/22 1931 92 %     Weight --      Height --      Head Circumference --      Peak Flow --      Pain Score 08/15/22 1930 0     Pain Loc --      Pain Education --      Exclude from Growth Chart --    No data found.  Updated Vital Signs BP 135/83 (BP Location: Right Arm)   Pulse 84   Temp 98.6 F (37 C)   Resp 17   SpO2 92%   Visual Acuity Right Eye Distance:   Left Eye Distance:   Bilateral Distance:    Right Eye Near:   Left Eye Near:    Bilateral Near:     Physical Exam Vitals and nursing note reviewed.  Constitutional:      General: He is not in acute distress.    Appearance: He is well-developed.  HENT:     Head: Normocephalic and atraumatic.  Eyes:     Conjunctiva/sclera: Conjunctivae normal.  Cardiovascular:     Rate and Rhythm: Normal rate and regular rhythm.  Heart sounds: No murmur heard. Pulmonary:     Effort: Pulmonary effort is normal. No respiratory distress.     Breath sounds: Normal breath sounds.  Abdominal:     Palpations: Abdomen is soft.     Tenderness: There is no abdominal tenderness.  Musculoskeletal:        General: No swelling.     Cervical back: Neck supple.  Skin:    General: Skin is warm and dry.     Capillary Refill: Capillary refill takes less than 2 seconds.  Neurological:     Mental Status: He is alert.  Psychiatric:        Mood and Affect: Mood normal.      UC Treatments / Results  Labs (all labs ordered are listed, but only abnormal results are displayed) Labs Reviewed - No data to display  EKG   Radiology No results found.  Procedures Procedures (including  critical care time)  Medications Ordered in UC Medications - No data to display  Initial Impression / Assessment and Plan / UC Course  I have reviewed the triage vital signs and the nursing notes.  Pertinent labs & imaging results that were available during my care of the patient were reviewed by me and considered in my medical decision making (see chart for details).     Hiccups.  Patient currently on PPI.  Will trial baclofen.  Discussed physical maneuvers.  Advise follow-up with PCP if no improvement.  No concerning findings are related to Barrett's esophagitis at this time, no nausea, vomiting, increased acid reflux.  Final Clinical Impressions(s) / UC Diagnoses   Final diagnoses:  Hiccups     Discharge Instructions      Take take baclofen as prescribed Continue with Protonix If no improvement follow up with PCP  Can try these physical maneuvers:  -Breath-holding for 5 to 10 seconds -Sipping on or gargling with very cold water -Biting into a lemon -Pulling on the tongue -Swallowing a teaspoon of dry, granulated sugar -Pressing gently, but firmly on the eyeballs -Drinking water through a rigid tube with a valve that requires significant suction effort. -While sitting, pulling the knees up to the chest, hold the position for 30 seconds to 1 minute if possible.   ED Prescriptions     Medication Sig Dispense Auth. Provider   baclofen (LIORESAL) 10 MG tablet Take 1 tablet (10 mg total) by mouth 3 (three) times daily as needed for muscle spasms. 30 each Ward, Tylene Fantasia, PA-C      PDMP not reviewed this encounter.   Ward, Tylene Fantasia, PA-C 08/15/22 2003

## 2022-08-15 NOTE — Discharge Instructions (Addendum)
Take take baclofen as prescribed Continue with Protonix If no improvement follow up with PCP  Can try these physical maneuvers:  -Breath-holding for 5 to 10 seconds -Sipping on or gargling with very cold water -Biting into a lemon -Pulling on the tongue -Swallowing a teaspoon of dry, granulated sugar -Pressing gently, but firmly on the eyeballs -Drinking water through a rigid tube with a valve that requires significant suction effort. -While sitting, pulling the knees up to the chest, hold the position for 30 seconds to 1 minute if possible.

## 2022-08-15 NOTE — ED Triage Notes (Signed)
Pt seen here last week for sore throat and hiccups. Taking antiacid medications, cough drops. Wife died recently. Reports was able to sleep last night. Reports hiccup every 5-10 seconds.  Hx Barrett's esophagus. Did have difficulty swallowing this afternoon.

## 2022-08-16 ENCOUNTER — Ambulatory Visit (HOSPITAL_COMMUNITY): Payer: Medicare Other

## 2022-08-16 ENCOUNTER — Other Ambulatory Visit: Payer: Self-pay | Admitting: Gastroenterology

## 2022-08-17 ENCOUNTER — Other Ambulatory Visit: Payer: Self-pay | Admitting: Gastroenterology

## 2022-08-17 NOTE — Telephone Encounter (Signed)
PT scheduled OV 10/2 to get refill on protonix. Requesting call back

## 2022-08-18 ENCOUNTER — Encounter: Payer: Self-pay | Admitting: Gastroenterology

## 2022-08-19 MED ORDER — PANTOPRAZOLE SODIUM 40 MG PO TBEC: 40.0000 mg | DELAYED_RELEASE_TABLET | Freq: Every day | ORAL | 0 refills | Status: DC

## 2022-09-14 ENCOUNTER — Encounter: Payer: Self-pay | Admitting: Cardiology

## 2022-09-14 ENCOUNTER — Other Ambulatory Visit: Payer: Self-pay | Admitting: Cardiology

## 2022-09-14 DIAGNOSIS — E7800 Pure hypercholesterolemia, unspecified: Secondary | ICD-10-CM

## 2022-09-15 ENCOUNTER — Encounter: Payer: Self-pay | Admitting: Internal Medicine

## 2022-09-15 ENCOUNTER — Encounter (INDEPENDENT_AMBULATORY_CARE_PROVIDER_SITE_OTHER): Payer: Self-pay

## 2022-09-15 NOTE — Progress Notes (Signed)
Outside notes received. Information abstracted. Notes sent to scan.  

## 2022-09-19 ENCOUNTER — Ambulatory Visit
Admission: RE | Admit: 2022-09-19 | Discharge: 2022-09-19 | Disposition: A | Discharge status: Home / Self Care | Payer: Medicare Other | Source: Ambulatory Visit | Attending: Gastroenterology | Admitting: Gastroenterology

## 2022-09-19 DIAGNOSIS — K5730 Diverticulosis of large intestine without perforation or abscess without bleeding: Secondary | ICD-10-CM | POA: Diagnosis not present

## 2022-09-19 DIAGNOSIS — K639 Disease of intestine, unspecified: Secondary | ICD-10-CM

## 2022-09-19 DIAGNOSIS — K5750 Diverticulosis of both small and large intestine without perforation or abscess without bleeding: Secondary | ICD-10-CM

## 2022-09-19 DIAGNOSIS — Q438 Other specified congenital malformations of intestine: Secondary | ICD-10-CM

## 2022-09-19 MED ORDER — IOPAMIDOL (ISOVUE-370) INJECTION 76%: 100.0000 mL | Freq: Once | INTRAVENOUS | Status: DC | PRN

## 2022-10-06 ENCOUNTER — Encounter: Payer: Self-pay | Admitting: Cardiology

## 2022-10-21 ENCOUNTER — Ambulatory Visit: Payer: Medicare Other | Admitting: Internal Medicine

## 2022-10-23 ENCOUNTER — Encounter: Payer: Self-pay | Admitting: Internal Medicine

## 2022-10-23 NOTE — Progress Notes (Unsigned)
Subjective:    Patient ID: Tom Gaines, male    DOB: Jul 06, 1945, 77 y.o.   MRN: [id number]     HPI Tom Gaines is here for follow up of his chronic medical problems.  Since he was here last his wife did pass away.  He feels he is doing okay.  Back pain - started late last week.  Taking tramadol and tylenol.    Gets tired in the afternoon - some days but not all days.   Gets good amount of sleep and typically gets back to sleep after getting up to use the bathroom.    Increased mucus in throat.  He does have very mild congestion.  He denies any reflux.   Medications and allergies reviewed with patient and updated if appropriate.  Current Outpatient Medications on File Prior to Visit  Medication Sig Dispense Refill   acetaminophen (TYLENOL) 500 MG tablet Take 500 mg by mouth every 6 (six) hours as needed.     acyclovir (ZOVIRAX) 800 MG tablet Take 400 mg by mouth 2 (two) times daily.     allopurinol (ZYLOPRIM) 300 MG tablet Take 1 tablet (300 mg total) by mouth daily. 90 tablet 1   Alpha-Lipoic Acid 600 MG CAPS Take by mouth.     Ascorbic Acid (VITAMIN C) 100 MG tablet Take 100 mg by mouth daily.     ASHWAGANDHA PO Take 300 mg by mouth 2 (two) times daily.     ASPIRIN 81 PO Take 1 tablet by mouth daily.     B Complex Vitamins (VITAMIN B COMPLEX PO)      Cholecalciferol (VITAMIN D) 2000 units CAPS Take 1 capsule by mouth daily.     co-enzyme Q-10 30 MG capsule Take 100 mg by mouth daily.     FOLIC ACID PO 6,063 mcg.     ipratropium (ATROVENT) 0.06 % nasal spray Place 2 sprays into both nostrils 2 (two) times daily.     L-CITRULLINE PO Take by mouth.     LYSINE PO Take 1,000 mg by mouth.     Multiple Vitamin (MULTIVITAMIN) tablet Take 1 tablet by mouth daily.     Omega-3 Fatty Acids (FISH OIL) 1000 MG CAPS Take 1,000 mg by mouth. 1 by mouth daily     Pyridoxine HCl (VITAMIN B6) 100 MG TABS      RESVERATROL PO Take by mouth. Patient takes bid     rosuvastatin (CRESTOR) 40  MG tablet TAKE 1 TABLET BY MOUTH EVERY DAY 90 tablet 1   traMADol (ULTRAM) 50 MG tablet Take 1 tablet (50 mg total) by mouth daily. For chronic back pain 90 tablet 0   triamcinolone cream (KENALOG) 0.1 % Apply topically 2 (two) times daily.     Turmeric Curcumin 500 MG CAPS Take 500 mg by mouth 3 (three) times daily.      Vitamin D-Vitamin K (D3 + K2 DOTS PO)      pantoprazole (PROTONIX) 40 MG tablet Take 1 tablet (40 mg total) by mouth daily. 90 tablet 0   No current facility-administered medications on file prior to visit.     Review of Systems  Constitutional:  Negative for fever.  HENT:  Positive for congestion (a little) and postnasal drip (maybe). Negative for sinus pressure, sneezing and sore throat.   Respiratory:  Negative for cough, shortness of breath and wheezing.   Cardiovascular:  Negative for chest pain, palpitations and leg swelling.  Gastrointestinal:  No gerd  Neurological:  Negative for dizziness, light-headedness and headaches.       Objective:   Vitals:   10/24/22 0845  BP: 114/80  Pulse: (!) 59  Temp: 98.3 F (36.8 C)  SpO2: 94%   BP Readings from Last 3 Encounters:  10/24/22 114/80  08/15/22 135/83  08/11/22 121/66   Wt Readings from Last 3 Encounters:  10/24/22 164 lb (74.4 kg)  08/11/22 170 lb (77.1 kg)  06/10/22 176 lb (79.8 kg)   Body mass index is 22.87 kg/m.    Physical Exam Constitutional:      General: He is not in acute distress.    Appearance: Normal appearance. He is not ill-appearing.  HENT:     Head: Normocephalic and atraumatic.  Eyes:     Conjunctiva/sclera: Conjunctivae normal.  Cardiovascular:     Rate and Rhythm: Normal rate and regular rhythm.     Heart sounds: Normal heart sounds.  Pulmonary:     Effort: Pulmonary effort is normal. No respiratory distress.     Breath sounds: Normal breath sounds. No wheezing or rales.  Musculoskeletal:     Right lower leg: No edema.     Left lower leg: No edema.  Skin:     General: Skin is warm and dry.     Findings: No rash.  Neurological:     Mental Status: He is alert. Mental status is at baseline.  Psychiatric:        Mood and Affect: Mood normal.        Lab Results  Component Value Date   WBC 6.5 04/20/2022   HGB 15.0 04/20/2022   HCT 45.4 04/20/2022   PLT 167.0 04/20/2022   GLUCOSE 90 04/20/2022   CHOL 124 04/20/2022   TRIG 61.0 04/20/2022   HDL 68.60 04/20/2022   LDLDIRECT 83.0 10/09/2019   LDLCALC 43 04/20/2022   ALT 28 04/20/2022   AST 39 (H) 04/20/2022   NA 138 04/20/2022   K 4.3 04/20/2022   CL 102 04/20/2022   CREATININE 1.28 04/20/2022   BUN 22 04/20/2022   CO2 28 04/20/2022   TSH 1.60 10/19/2021   PSA 0.60 03/01/2010   HGBA1C 6.0 04/20/2022     Assessment & Plan:    See Problem List for Assessment and Plan of chronic medical problems.

## 2022-10-23 NOTE — Patient Instructions (Addendum)
      Blood work was ordered.   The lab is on the first floor.    Medications changes include :   none     Return in about 6 months (around 04/23/2023) for follow up.

## 2022-10-24 ENCOUNTER — Ambulatory Visit (INDEPENDENT_AMBULATORY_CARE_PROVIDER_SITE_OTHER): Payer: Medicare Other | Admitting: Internal Medicine

## 2022-10-24 VITALS — BP 114/80 | HR 59 | Temp 98.3°F | Ht 71.0 in | Wt 164.0 lb

## 2022-10-24 DIAGNOSIS — I77810 Thoracic aortic ectasia: Secondary | ICD-10-CM

## 2022-10-24 DIAGNOSIS — R7303 Prediabetes: Secondary | ICD-10-CM | POA: Diagnosis not present

## 2022-10-24 DIAGNOSIS — G8929 Other chronic pain: Secondary | ICD-10-CM | POA: Diagnosis not present

## 2022-10-24 DIAGNOSIS — R7983 Abnormal findings of blood amino-acid level: Secondary | ICD-10-CM

## 2022-10-24 DIAGNOSIS — M545 Low back pain, unspecified: Secondary | ICD-10-CM

## 2022-10-24 DIAGNOSIS — E7800 Pure hypercholesterolemia, unspecified: Secondary | ICD-10-CM

## 2022-10-24 DIAGNOSIS — E538 Deficiency of other specified B group vitamins: Secondary | ICD-10-CM | POA: Diagnosis not present

## 2022-10-24 DIAGNOSIS — N1832 Chronic kidney disease, stage 3b: Secondary | ICD-10-CM

## 2022-10-24 DIAGNOSIS — Z8739 Personal history of other diseases of the musculoskeletal system and connective tissue: Secondary | ICD-10-CM | POA: Diagnosis not present

## 2022-10-24 NOTE — Assessment & Plan Note (Addendum)
Chronic Homocystine level has been good-will hold off on checking today and start checking once a year Improved with supplementation of a few vitamin B's Continue folate, B12 and B6 supplementation

## 2022-10-24 NOTE — Assessment & Plan Note (Signed)
Chronic Following with nephrology Overall GFR stable CMP

## 2022-10-24 NOTE — Assessment & Plan Note (Signed)
Chronic Coronary calcium score 320 Regular exercise and healthy diet  Lab Results  Component Value Date   LDLCALC 43 04/20/2022   Lipids well controlled Check lipid panel, CMP Continue Crestor 40 mg daily

## 2022-10-24 NOTE — Assessment & Plan Note (Signed)
Chronic Lab Results  Component Value Date   HGBA1C 6.0 04/20/2022   Sugars stable in prediabetic range Check A1c Continue healthy diet, regular exercise

## 2022-10-24 NOTE — Assessment & Plan Note (Signed)
Chronic Doing back exercises regularly Pain controlled Continue tramadol 50 mg daily as needed Continue Tylenol as needed

## 2022-10-24 NOTE — Assessment & Plan Note (Signed)
Chronic Monitored by cardiology Blood pressure controlled

## 2022-10-24 NOTE — Assessment & Plan Note (Addendum)
Chronic Denies episodes of gout Continue allopurinol 300 mg daily Last uric acid level well-controlled at 5.2 Check uric acid level

## 2022-10-24 NOTE — Assessment & Plan Note (Signed)
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

## 2022-10-25 DIAGNOSIS — R7983 Abnormal findings of blood amino-acid level: Secondary | ICD-10-CM | POA: Diagnosis not present

## 2022-10-25 DIAGNOSIS — E538 Deficiency of other specified B group vitamins: Secondary | ICD-10-CM | POA: Diagnosis not present

## 2022-10-25 LAB — CBC WITH DIFFERENTIAL/PLATELET
Basophils Absolute: 0 10*3/uL (ref 0.0–0.1)
Basophils Relative: 0.9 % (ref 0.0–3.0)
Eosinophils Absolute: 0.1 10*3/uL (ref 0.0–0.7)
Eosinophils Relative: 2.8 % (ref 0.0–5.0)
HCT: 44.2 % (ref 39.0–52.0)
Hemoglobin: 14.5 g/dL (ref 13.0–17.0)
Lymphocytes Relative: 26.3 % (ref 12.0–46.0)
Lymphs Abs: 1.3 10*3/uL (ref 0.7–4.0)
MCHC: 32.8 g/dL (ref 30.0–36.0)
MCV: 97 fl (ref 78.0–100.0)
Monocytes Absolute: 0.6 10*3/uL (ref 0.1–1.0)
Monocytes Relative: 11.1 % (ref 3.0–12.0)
Neutro Abs: 3 10*3/uL (ref 1.4–7.7)
Neutrophils Relative %: 58.9 % (ref 43.0–77.0)
Platelets: 173 10*3/uL (ref 150.0–400.0)
RBC: 4.56 Mil/uL (ref 4.22–5.81)
RDW: 15.7 % — ABNORMAL HIGH (ref 11.5–15.5)
WBC: 5.1 10*3/uL (ref 4.0–10.5)

## 2022-10-25 LAB — COMPREHENSIVE METABOLIC PANEL
ALT: 23 U/L (ref 0–53)
AST: 33 U/L (ref 0–37)
Albumin: 4.2 g/dL (ref 3.5–5.2)
Alkaline Phosphatase: 71 U/L (ref 39–117)
BUN: 27 mg/dL — ABNORMAL HIGH (ref 6–23)
CO2: 27 mEq/L (ref 19–32)
Calcium: 9.7 mg/dL (ref 8.4–10.5)
Chloride: 104 mEq/L (ref 96–112)
Creatinine, Ser: 1.43 mg/dL (ref 0.40–1.50)
GFR: 47.27 mL/min — ABNORMAL LOW (ref >60.00)
Glucose, Bld: 87 mg/dL (ref 70–99)
Potassium: 4 mEq/L (ref 3.5–5.1)
Sodium: 139 mEq/L (ref 135–145)
Total Bilirubin: 0.6 mg/dL (ref 0.2–1.2)
Total Protein: 7 g/dL (ref 6.0–8.3)

## 2022-10-25 LAB — LIPID PANEL
Cholesterol: 135 mg/dL (ref 0–200)
HDL: 76.5 mg/dL (ref >39.00)
LDL Cholesterol: 46 mg/dL (ref 0–99)
NonHDL: 58.56
Total CHOL/HDL Ratio: 2
Triglycerides: 65 mg/dL (ref 0.0–149.0)
VLDL: 13 mg/dL (ref 0.0–40.0)

## 2022-10-25 LAB — VITAMIN D 25 HYDROXY (VIT D DEFICIENCY, FRACTURES): VITD: 69.47 ng/mL (ref 30.00–100.00)

## 2022-10-25 LAB — URIC ACID: Uric Acid, Serum: 4.5 mg/dL (ref 4.0–7.8)

## 2022-10-25 LAB — HEMOGLOBIN A1C: Hgb A1c MFr Bld: 5.9 % (ref 4.6–6.5)

## 2022-10-26 LAB — HOMOCYSTEINE: Homocysteine: 13.2 umol/L — ABNORMAL HIGH (ref <11.4)

## 2022-10-28 ENCOUNTER — Encounter: Payer: Self-pay | Admitting: Internal Medicine

## 2022-11-02 ENCOUNTER — Ambulatory Visit (INDEPENDENT_AMBULATORY_CARE_PROVIDER_SITE_OTHER): Payer: Medicare Other | Admitting: Gastroenterology

## 2022-11-02 ENCOUNTER — Encounter: Payer: Self-pay | Admitting: Gastroenterology

## 2022-11-02 VITALS — BP 128/70 | HR 56 | Ht 71.0 in | Wt 168.8 lb

## 2022-11-02 DIAGNOSIS — K219 Gastro-esophageal reflux disease without esophagitis: Secondary | ICD-10-CM | POA: Diagnosis not present

## 2022-11-02 DIAGNOSIS — Z860101 Personal history of adenomatous and serrated colon polyps: Secondary | ICD-10-CM

## 2022-11-02 DIAGNOSIS — K5730 Diverticulosis of large intestine without perforation or abscess without bleeding: Secondary | ICD-10-CM | POA: Diagnosis not present

## 2022-11-02 MED ORDER — FAMOTIDINE 40 MG PO TABS: 40.0000 mg | ORAL_TABLET | Freq: Two times a day (BID) | ORAL | 11 refills | Status: DC

## 2022-11-02 NOTE — Patient Instructions (Signed)
Stop pantoprazole.   We have sent the following medications to your pharmacy for you to pick up at your convenience: famotidine 40 mg twice daily.   Follow up with Dr. Doy Hutching in a year.   The [facility] providers would like to encourage you to use [facility] to communicate with providers for non-urgent requests or questions.  Due to long hold times on the telephone, sending your provider a message by [facility] may be a faster and more efficient way to get a response.  Please allow 48 business hours for a response.  Please remember that this is for non-urgent requests.   Thank you for choosing me and [facility].  Venita Lick. Pleas Koch., MD., Clementeen Graham

## 2022-11-02 NOTE — Progress Notes (Signed)
Assessment     GERD, infrequent and non progressive dysphagia  Personal history of adenomatous colon polyps, no longer in surveillance.  Virtual colonoscopy in August 2024 was polyp free Colonic diverticulosis   Recommendations    He wants to stop pantoprazole due to side effect concerns. Follow antireflux measures and start famotidine 40 mg po bid. Contact us if symptoms not adequately controlled. Offered MBSS to further evaluate his intermittent dysphagia and he would like to hold off on further testing unless symptoms worsen. High fiber diet REV in 1 year    HPI    This is a 77 year old male with GERD and infrequent dysphagia.  He relates good control of his reflux symptoms however he continues to experience intermittent dysphagia that has not increased in severity or frequency.  It occurs occasionally with pills and occasionally with solids.  EGD and barium esophagram did not reveal a cause.  He has been reading about potential side effects of PPIs and was concerned about taking a PPI long-term.  EGD May 2023 - Z-line variable at the gastroesophageal junction. Biopsied.  - No endoscopic esophageal abnormality to explain patient's dysphagia. Esophagus dilated.  - Erythematous mucosa in the antrum. Biopsied.  - Small hiatal hernia.  - Normal duodenal bulb and second portion of the duodenum.    Labs / Imaging       Latest Ref Rng & Units 10/25/2022    9:02 AM 04/20/2022    9:59 AM 10/19/2021    8:46 AM  Hepatic Function  Total Protein 6.0 - 8.3 g/dL 7.0  7.3  6.6   Albumin 3.5 - 5.2 g/dL 4.2  4.2  4.0   AST 0 - 37 U/L 33  39  31   ALT 0 - 53 U/L 23  28  23    Alk Phosphatase 39 - 117 U/L 71  88  72   Total Bilirubin 0.2 - 1.2 mg/dL 0.6  0.7  0.4        Latest Ref Rng & Units 10/25/2022    9:02 AM 04/20/2022    9:59 AM 10/19/2021    8:46 AM  CBC  WBC 4.0 - 10.5 K/uL 5.1  6.5  5.8   Hemoglobin 13.0 - 17.0 g/dL 16.1  09.6  04.5   Hematocrit 39.0 - 52.0 % 44.2  45.4   41.7   Platelets 150.0 - 400.0 K/uL 173.0  167.0  163.0      CT VIRTUAL COLONOSCOPY DIAGNOSTIC CLINICAL DATA:  Incomplete colonoscopy.  Tortuous colon.  EXAM: CT VIRTUAL COLONOSCOPY DIAGNOSTIC  TECHNIQUE: The patient was given a standard bowel preparation with Gastrografin and barium for fluid and stool tagging respectively. The quality of the bowel preparation is poor. Automated CO2 insufflation of the colon was performed prior to image acquisition and colonic distention is fair. Image post processing was used to generate a 3D endoluminal fly-through projection of the colon and to electronically subtract stool/fluid as appropriate.  COMPARISON:  05/10/2017  FINDINGS: VIRTUAL COLONOSCOPY  Large amount of retained barium throughout the colon. Diffuse colonic diverticulosis, most pronounced in the sigmoid diverticulosis. Tortuous colon. No fixed non barium tagged polypoid filling defects or annular constricting lesions.  Virtual colonoscopy is not designed to detect diminutive polyps (i.e., less than or equal to 5 mm), the presence or absence of which may not affect clinical management.  CT ABDOMEN AND PELVIS WITHOUT CONTRAST  Lower chest: Centrilobular emphysema.  No confluent opacities.  Hepatobiliary: No focal hepatic abnormality. Gallbladder  unremarkable.  Pancreas: No focal abnormality or ductal dilatation.  Spleen: No focal abnormality.  Normal size.  Adrenals/Urinary Tract: Exophytic lesion off the upper pole of the left kidney measures 1.8 cm, stable compatible with a benign cyst. No suspicious renal or adrenal lesion. 2 mm nonobstructing stone in the lower pole of the left kidney. No ureteral stones or hydronephrosis. Urinary bladder decompressed.  Stomach/Bowel: Stomach and small bowel unremarkable.  Vascular/Lymphatic: Aortic atherosclerosis. No evidence of aneurysm or adenopathy.  Reproductive: No visible focal abnormality.  Other: No free fluid or  free air.  Musculoskeletal: No acute bony abnormality.  IMPRESSION: Tortuous colon. Diffuse colonic diverticulosis. No visible fixed polypoid filling defects or annular constricting lesions.  Punctate left lower pole nephrolithiasis.  No hydronephrosis.  Aortic atherosclerosis.  No acute abnormality.  Electronically Signed   By: Charlett Nose M.D.   On: 09/23/2022 00:37  Current Medications, Allergies, Past Medical History, Past Surgical History, Family History and Social History were reviewed in [employer] record.   Physical Exam: General: Well developed, well nourished, no acute distress Head: Normocephalic and atraumatic Eyes: Sclerae anicteric, EOMI Ears: Normal auditory acuity Mouth: No deformities or lesions noted Lungs: Clear throughout to auscultation Heart: Regular rate and rhythm; No murmurs, rubs or bruits Abdomen: Soft, non tender and non distended. No masses, hepatosplenomegaly or hernias noted. Normal Bowel sounds Rectal: Not done Musculoskeletal: Symmetrical with no gross deformities  Pulses:  Normal pulses noted Extremities: No edema or deformities noted Neurological: Alert oriented x 4, grossly nonfocal Psychological:  Alert and cooperative. Normal mood and affect   Amontae Ng T. Russella Dar, MD 11/02/2022, 10:22 AM

## 2022-11-18 ENCOUNTER — Ambulatory Visit (INDEPENDENT_AMBULATORY_CARE_PROVIDER_SITE_OTHER): Payer: Medicare Other | Admitting: Audiology

## 2022-11-18 ENCOUNTER — Other Ambulatory Visit: Payer: Self-pay | Admitting: Gastroenterology

## 2022-11-18 DIAGNOSIS — H903 Sensorineural hearing loss, bilateral: Secondary | ICD-10-CM

## 2022-11-18 NOTE — Progress Notes (Signed)
HPI: FU PVCs and CAD.  ETT November 2011 showed no ST changes.  Monitor August 2020 showed sinus bradycardia to sinus tachycardia, frequent PVCs and 6 beats of nonsustained ventricular tachycardia.  Abdominal ultrasound September 2020 showed no aneurysm.  Calcium score November 2022 320 which was 54th percentile; ascending aorta measured at 4 cm.  Echocardiogram November 2022 showed normal LV function, grade 1 diastolic dysfunction, mildly dilated ascending aorta at 41 mm.  Note patient's aorta was noted to be 4 cm on echocardiogram September 2020.  CTA December 2023 showed 4 cm thoracic aortic ectasia, coronary atherosclerosis and emphysema.  Since last seen the patient denies any dyspnea on exertion, orthopnea, PND, pedal edema, palpitations, syncope or chest pain.   Current Outpatient Medications  Medication Sig Dispense Refill   acetaminophen (TYLENOL) 500 MG tablet Take 500 mg by mouth every 6 (six) hours as needed.     acyclovir (ZOVIRAX) 800 MG tablet Take 400 mg by mouth 2 (two) times daily.     allopurinol (ZYLOPRIM) 300 MG tablet Take 1 tablet (300 mg total) by mouth daily. 90 tablet 1   Alpha-Lipoic Acid 600 MG CAPS Take by mouth.     Ascorbic Acid (VITAMIN C) 100 MG tablet Take 100 mg by mouth daily.     ASHWAGANDHA PO Take 300 mg by mouth 2 (two) times daily.     ASPIRIN 81 PO Take 1 tablet by mouth daily.     B Complex Vitamins (VITAMIN B COMPLEX PO)      Cholecalciferol (VITAMIN D) 2000 units CAPS Take 1 capsule by mouth daily.     co-enzyme Q-10 30 MG capsule Take 100 mg by mouth daily.     CREATINE MONOHYDRATE PO      FOLIC ACID PO 1,610 mcg.     ipratropium (ATROVENT) 0.06 % nasal spray Place 2 sprays into both nostrils 2 (two) times daily.     L-CITRULLINE PO Take by mouth.     LYSINE PO Take 1,000 mg by mouth.     Multiple Vitamin (MULTIVITAMIN) tablet Take 1 tablet by mouth daily.     Omega-3 Fatty Acids (FISH OIL) 1000 MG CAPS Take 1,000 mg by mouth. 1 by mouth  daily     omeprazole (PRILOSEC) 40 MG capsule Take 1 capsule (40 mg total) by mouth daily. 90 capsule 3   Pyridoxine HCl (VITAMIN B6) 100 MG TABS      RESVERATROL PO Take by mouth. Patient takes bid     rosuvastatin (CRESTOR) 40 MG tablet TAKE 1 TABLET BY MOUTH EVERY DAY 90 tablet 1   traMADol (ULTRAM) 50 MG tablet Take 1 tablet (50 mg total) by mouth daily. For chronic back pain 90 tablet 0   triamcinolone cream (KENALOG) 0.1 % Apply topically 2 (two) times daily.     Turmeric Curcumin 500 MG CAPS Take 500 mg by mouth 3 (three) times daily.      Vitamin D-Vitamin K (D3 + K2 DOTS PO)      famotidine (PEPCID) 40 MG tablet Take 1 tablet (40 mg total) by mouth 2 (two) times daily. (Patient not taking: Reported on 12/01/2022) 60 tablet 11   No current facility-administered medications for this visit.     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 LDL goal equal less than 160, ideally less than 130. No FH MI or CVA   Low back pain 03/13/2014   Chronic, has seen Dr Katrinka Blazing and Dr Charlett Blake Taking tramadol as needed   Nonallopathic lesion of lumbosacral region 12/01/2015   Nonallopathic lesion of sacral region 12/01/2015   Nonallopathic lesion of thoracic region 12/01/2015    Prediabetes 03/29/2016   Mom, sister DM   PROSTATE CANCER, HX OF 03/12/2008   Qualifier: Diagnosis of  By: Alwyn Ren MD, [facility]   Radical prostatectomy 1998, Urologist Dr. Darvin Neighbours     Past Surgical History:  Procedure Laterality Date   CATARACT EXTRACTION, BILATERAL  2017   colonoscopy with polypectomy  2014   Dr Clenton Pare [facility] SURGERY  2002   Dr Danielle Dess   PROSTATECTOMY  1998   SHOULDER ARTHROSCOPY     right; 2011; left 1990   SIGMOIDOSCOPY     TONSILLECTOMY AND ADENOIDECTOMY      Social History   Socioeconomic History   Marital status: Single    Spouse name: Not on file   Number of children: Not on file   Years of education: Not on file   Highest education level: Professional school degree (e.g., MD, DDS, DVM, JD)  Occupational History   Occupation: retired  Tobacco Use   Smoking status: Former    Current packs/day: 0.00    Types: Cigarettes    Quit date: 01/31/1985    Years since quitting: 37.8   Smokeless tobacco: Never   Tobacco comments:    smoked  1965-   1987, up to 2.5 ppd  Vaping Use   Vaping status: Never Used  Substance and Sexual Activity   Alcohol use: No    Comment:  none since 2012   Drug use: No   Sexual activity: Yes  Other Topics Concern   Not on file  Social History Narrative   Exercises regularly - gym 5 days a week   Social Determinants of Health   Financial Resource Strain: Low Risk  (06/09/2022)   Overall Financial Resource Strain (CARDIA)    Difficulty of Paying Living Expenses: Not hard at all  Food Insecurity: No Food Insecurity (06/09/2022)   Hunger Vital Sign    Worried About Running Out of Food in the Last Year: Never true    Ran Out of Food in the Last Year: Never true  Transportation Needs: No Transportation Needs (06/09/2022)   PRAPARE - Administrator, Civil Service (Medical): No    Lack of Transportation (Non-Medical): No  Physical Activity: Sufficiently Active (06/09/2022)   Exercise Vital Sign    Days of Exercise  per Week: 5 days    Minutes of Exercise per Session: 50 min  Stress: No Stress Concern Present (06/09/2022)   [employer] of Occupational Health - Occupational Stress Questionnaire    Feeling of Stress :  Not at all  Social Connections: Unknown (06/09/2022)   Social Connection and Isolation Panel [NHANES]    Frequency of Communication with Friends and Family: Three times a week    Frequency of Social Gatherings with Friends and Family: Three times a week    Attends Religious Services: Patient declined    Active Member of Clubs or Organizations: Yes    Attends Banker Meetings: Patient declined    Marital Status: Married  Catering manager Violence: Not At Risk (04/20/2022)   Humiliation, Afraid, Rape, and Kick questionnaire    Fear of Current or Ex-Partner: No    Emotionally Abused: No    Physically Abused: No    Sexually Abused: No    Family History  Problem Relation Age of Onset   Diabetes Mother    Dementia Mother    Diabetes Sister    Lung cancer Other        maternal uncle & aunt   Healthy Daughter    Colon cancer Neg Hx    Stomach cancer Neg Hx    Arthritis Neg Hx    Heart disease Neg Hx    Stroke Neg Hx    Esophageal cancer Neg Hx    Liver cancer Neg Hx    Pancreatic cancer Neg Hx    Rectal cancer Neg Hx     ROS: no fevers or chills, productive cough, hemoptysis, dysphasia, odynophagia, melena, hematochezia, dysuria, hematuria, rash, seizure activity, orthopnea, PND, pedal edema, claudication. Remaining systems are negative.  Physical Exam: Well-developed well-nourished in no acute distress.  Skin is warm and dry.  HEENT is normal.  Neck is supple.  Chest is clear to auscultation with normal expansion.  Cardiovascular exam is regular rate and rhythm.  Abdominal exam nontender or distended. No masses palpated. Extremities show no edema. neuro grossly intact  EKG Interpretation Date/Time:  Thursday December 01 2022 15:34:10 EDT Ventricular Rate:   69 PR Interval:  204 QRS Duration:  88 QT Interval:  366 QTC Calculation: 392 R Axis:   15  Text Interpretation: Sinus rhythm with occasional Premature ventricular complexes Cannot rule out Anterior infarct Confirmed by Olga Millers ([id number]) on 12/01/2022 3:41:25 PM    A/P  1 coronary artery disease-this is based on previous elevation in calcium score.  Plan to continue medical therapy with aspirin and statin.  2 hyperlipidemia-continue Crestor at present dose.  3 mildly dilated aortic root-CTA shows dilatation at 4 cm which is unchanged compared to previous studies.  Will plan follow-up study in December 2025.  4 PVCs-LV function is normal and he is asymptomatic.  He would prefer to avoid beta-blockade.  5 chronic stage III kidney disease-patient is followed by primary care.  Olga Millers, MD

## 2022-11-18 NOTE — Progress Notes (Unsigned)
  [address] [place], [place] [address] [phone]  Hearing Aid Check    Tom Gaines comes for a scheduled appointment for a hearing aid check.  Time in:1:30pm Time out:1:55pm Accompanied BJ:YNWG   Right Left  Hearing aid manufacturer  Oticon Oticon   Hearing aid style Receiver in the canal Receiver in the canal  Hearing aid battery rechargebale rechargebale  Receiver 1-85 1-85  Dome/ custom earpiece Patient will try different options. 8mm single vent  Retention wire yes  yes  Warranty expiration date 02-04-2025 02-04-2025  Loss and Damage    Additional accessories Expiration date Connect line TV 3: 02-05-2023 Smart charger: 02-04-2025   Initial fitting date 01-07-2022 01-07-2022  Device [facility]: [facility]  Hearing aid services Bundled until: 02-04-2025  Bundled until: 02-04-2025     Chief complaint: Patient reports the right dome bothers him as he uses it during the day. He has tried different domes, but I do not have that information accessible at the moment.  Actions taken: Looked for several options for him to try. I would like him to let us know if one of them worked and which one. If the patient cannot find one that feels comfortable he may consider a custom earpiece to be made for an additional non - refundable charge.  Services fee: $0 was paid at checkout.  Recommend: Return for a hearing aid check , as needed. Return for a hearing evaluation and to see an ENT, if concerns with hearing changes arise.    Tom Gaines Tom Gaines, AUD

## 2022-11-29 DIAGNOSIS — R7303 Prediabetes: Secondary | ICD-10-CM | POA: Diagnosis not present

## 2022-11-29 DIAGNOSIS — N1831 Chronic kidney disease, stage 3a: Secondary | ICD-10-CM | POA: Diagnosis not present

## 2022-11-29 DIAGNOSIS — K2270 Barrett's esophagus without dysplasia: Secondary | ICD-10-CM | POA: Diagnosis not present

## 2022-11-29 DIAGNOSIS — N2581 Secondary hyperparathyroidism of renal origin: Secondary | ICD-10-CM | POA: Diagnosis not present

## 2022-11-29 DIAGNOSIS — R5383 Other fatigue: Secondary | ICD-10-CM | POA: Diagnosis not present

## 2022-11-29 DIAGNOSIS — I77810 Thoracic aortic ectasia: Secondary | ICD-10-CM | POA: Diagnosis not present

## 2022-11-29 DIAGNOSIS — M109 Gout, unspecified: Secondary | ICD-10-CM | POA: Diagnosis not present

## 2022-11-29 DIAGNOSIS — E785 Hyperlipidemia, unspecified: Secondary | ICD-10-CM | POA: Diagnosis not present

## 2022-11-29 DIAGNOSIS — I5189 Other ill-defined heart diseases: Secondary | ICD-10-CM | POA: Diagnosis not present

## 2022-11-29 DIAGNOSIS — E7211 Homocystinuria: Secondary | ICD-10-CM | POA: Diagnosis not present

## 2022-11-30 LAB — LAB REPORT - SCANNED
Albumin, Urine POC: 70
Creatinine, POC: 64 mg/dL
Microalb Creat Ratio: 109

## 2022-12-01 ENCOUNTER — Encounter: Payer: Self-pay | Admitting: Cardiology

## 2022-12-01 ENCOUNTER — Encounter: Payer: Self-pay | Admitting: Gastroenterology

## 2022-12-01 ENCOUNTER — Ambulatory Visit: Payer: Medicare Other | Attending: Cardiology | Admitting: Cardiology

## 2022-12-01 VITALS — BP 114/70 | HR 69 | Wt 172.6 lb

## 2022-12-01 DIAGNOSIS — I77810 Thoracic aortic ectasia: Secondary | ICD-10-CM | POA: Diagnosis not present

## 2022-12-01 MED ORDER — OMEPRAZOLE 40 MG PO CPDR: 40.0000 mg | DELAYED_RELEASE_CAPSULE | Freq: Every day | ORAL | 3 refills | Status: DC

## 2022-12-01 NOTE — Addendum Note (Signed)
Addended by: Loretha Stapler on: 12/01/2022 10:46 AM   Modules accepted: Orders

## 2022-12-01 NOTE — Patient Instructions (Signed)
  Testing/Procedures: Will need CTA in December 2025.    Follow-Up: At [facility], you and your health needs are our priority.  As part of our continuing mission to provide you with exceptional heart care, we have created designated Provider Care Teams.  These Care Teams include your primary Cardiologist (physician) and Advanced Practice Providers (APPs -  Physician Assistants and Nurse Practitioners) who all work together to provide you with the care you need, when you need it.  We recommend signing up for the patient portal called "MyChart".  Sign up information is provided on this After Visit Summary.  MyChart is used to connect with patients for Virtual Visits (Telemedicine).  Patients are able to view lab/test results, encounter notes, upcoming appointments, etc.  Non-urgent messages can be sent to your provider as well.   To learn more about what you can do with MyChart, go to [email or web address].    Your next appointment:   12 month(s)  Provider:   Olga Millers, MD

## 2022-12-06 ENCOUNTER — Ambulatory Visit (INDEPENDENT_AMBULATORY_CARE_PROVIDER_SITE_OTHER): Payer: Self-pay | Admitting: Audiology

## 2022-12-06 DIAGNOSIS — H903 Sensorineural hearing loss, bilateral: Secondary | ICD-10-CM

## 2022-12-06 NOTE — Progress Notes (Signed)
  [address], [address] [place], [place] [address] [phone]  Hearing Aid Check     CAVION FAIOLA comes for a scheduled appointment for a hearing aid check.  Time in:9:00am Time out:9:20am Accompanied BJ:YNWG     Right Left  Hearing aid manufacturer  Oticon Oticon   Hearing aid style Receiver in the canal Receiver in the canal  Hearing aid battery rechargebale rechargebale  Receiver 1-85 1-85  Dome/ custom earpiece 6mm power dome 6mm power dome (per patient preference)  Retention wire yes  yes  Warranty expiration date 02-04-2025 02-04-2025  Loss and Damage      Additional accessories Expiration date Connect line TV 3: 02-05-2023 Smart charger: 02-04-2025    Initial fitting date 01-07-2022 01-07-2022  Device was fit at: [facility]  Hearing aid services Bundled until: 02-04-2025   Bundled until: 02-04-2025       Chief complaint: Patient reports the 6mm double dome in the right era seems to work better for him.  Actions taken: Inspection of the device and listening check showed that both aids were working well. Both aids were re-paired to the phone and they seemed to be working well. Oticon's support line was shared with the patient in case he experience bluetooh connectivity issues again. A pack with power domes (6mm) was provided to the patient.  Services fee: $0 was paid at checkout.  Recommend:  Return for a hearing aid check as needed. Return for a hearing evaluation and to see an ENT, if concerns with hearing changes arise.   Jentry Warnell MARIE LEROUX-MARTINEZ, AUD

## 2022-12-12 DIAGNOSIS — C61 Malignant neoplasm of prostate: Secondary | ICD-10-CM | POA: Diagnosis not present

## 2022-12-14 DIAGNOSIS — M19031 Primary osteoarthritis, right wrist: Secondary | ICD-10-CM | POA: Diagnosis not present

## 2022-12-14 DIAGNOSIS — M1811 Unilateral primary osteoarthritis of first carpometacarpal joint, right hand: Secondary | ICD-10-CM | POA: Diagnosis not present

## 2022-12-15 DIAGNOSIS — N529 Male erectile dysfunction, unspecified: Secondary | ICD-10-CM | POA: Diagnosis not present

## 2022-12-15 DIAGNOSIS — C61 Malignant neoplasm of prostate: Secondary | ICD-10-CM | POA: Diagnosis not present

## 2022-12-15 DIAGNOSIS — R972 Elevated prostate specific antigen [PSA]: Secondary | ICD-10-CM | POA: Diagnosis not present

## 2022-12-21 ENCOUNTER — Other Ambulatory Visit: Payer: Self-pay | Admitting: *Deleted

## 2022-12-21 DIAGNOSIS — I77810 Thoracic aortic ectasia: Secondary | ICD-10-CM

## 2022-12-22 ENCOUNTER — Telehealth: Payer: Self-pay | Admitting: Cardiology

## 2022-12-22 NOTE — Telephone Encounter (Signed)
New Message:     Verlon Au is calling to find out if this patient's CT is supposed to be  cancelled.

## 2022-12-22 NOTE — Telephone Encounter (Signed)
Order cancelled. According to last office note, does not need to be repeated until 2025.

## 2022-12-25 ENCOUNTER — Encounter: Payer: Self-pay | Admitting: Cardiology

## 2022-12-25 DIAGNOSIS — E7800 Pure hypercholesterolemia, unspecified: Secondary | ICD-10-CM

## 2022-12-26 MED ORDER — ROSUVASTATIN CALCIUM 40 MG PO TABS: 40.0000 mg | ORAL_TABLET | Freq: Every day | ORAL | 3 refills | Status: DC

## 2022-12-27 ENCOUNTER — Other Ambulatory Visit: Payer: Self-pay | Admitting: Internal Medicine

## 2023-02-16 DIAGNOSIS — D485 Neoplasm of uncertain behavior of skin: Secondary | ICD-10-CM | POA: Diagnosis not present

## 2023-02-16 DIAGNOSIS — L111 Transient acantholytic dermatosis [Grover]: Secondary | ICD-10-CM | POA: Diagnosis not present

## 2023-02-16 DIAGNOSIS — L309 Dermatitis, unspecified: Secondary | ICD-10-CM | POA: Diagnosis not present

## 2023-02-16 DIAGNOSIS — Z85828 Personal history of other malignant neoplasm of skin: Secondary | ICD-10-CM | POA: Diagnosis not present

## 2023-02-16 DIAGNOSIS — L820 Inflamed seborrheic keratosis: Secondary | ICD-10-CM | POA: Diagnosis not present

## 2023-02-27 DIAGNOSIS — M25512 Pain in left shoulder: Secondary | ICD-10-CM | POA: Diagnosis not present

## 2023-02-27 DIAGNOSIS — M25511 Pain in right shoulder: Secondary | ICD-10-CM | POA: Diagnosis not present

## 2023-02-27 NOTE — Progress Notes (Unsigned)
Subjective:    Patient ID: [name], male    DOB: [birth year], 78 y.o.   MRN: [id number]      HPI Mehtaab is here for No chief complaint on file.   1 week ago he had a severe nosebleed that took 2 hours to stop bleeding.  He had a recurrent nosebleed after he blew his nose.     Medications and allergies reviewed with patient and updated if appropriate.  Current Outpatient Medications on File Prior to Visit  Medication Sig Dispense Refill   acetaminophen (TYLENOL) 500 MG tablet Take 500 mg by mouth every 6 (six) hours as needed.     acyclovir (ZOVIRAX) 800 MG tablet Take 400 mg by mouth 2 (two) times daily.     allopurinol (ZYLOPRIM) 300 MG tablet TAKE 1 TABLET BY MOUTH EVERY DAY 90 tablet 1   Alpha-Lipoic Acid 600 MG CAPS Take by mouth.     Ascorbic Acid (VITAMIN C) 100 MG tablet Take 100 mg by mouth daily.     ASHWAGANDHA PO Take 300 mg by mouth 2 (two) times daily.     ASPIRIN 81 PO Take 1 tablet by mouth daily.     B Complex Vitamins (VITAMIN B COMPLEX PO)      Cholecalciferol (VITAMIN D) 2000 units CAPS Take 1 capsule by mouth daily.     co-enzyme Q-10 30 MG capsule Take 100 mg by mouth daily.     CREATINE MONOHYDRATE PO      famotidine (PEPCID) 40 MG tablet Take 1 tablet (40 mg total) by mouth 2 (two) times daily. (Patient not taking: Reported on 12/01/2022) 60 tablet 11   FOLIC ACID PO 4,098 mcg.     ipratropium (ATROVENT) 0.06 % nasal spray Place 2 sprays into both nostrils 2 (two) times daily.     L-CITRULLINE PO Take by mouth.     LYSINE PO Take 1,000 mg by mouth.     Multiple Vitamin (MULTIVITAMIN) tablet Take 1 tablet by mouth daily.     Omega-3 Fatty Acids (FISH OIL) 1000 MG CAPS Take 1,000 mg by mouth. 1 by mouth daily     omeprazole (PRILOSEC) 40 MG capsule Take 1 capsule (40 mg total) by mouth daily. 90 capsule 3   Pyridoxine HCl (VITAMIN B6) 100 MG TABS      RESVERATROL PO Take by mouth. Patient takes bid     rosuvastatin (CRESTOR) 40 MG tablet Take  1 tablet (40 mg total) by mouth daily. 90 tablet 3   traMADol (ULTRAM) 50 MG tablet Take 1 tablet (50 mg total) by mouth daily. For chronic back pain 90 tablet 0   triamcinolone cream (KENALOG) 0.1 % Apply topically 2 (two) times daily.     Turmeric Curcumin 500 MG CAPS Take 500 mg by mouth 3 (three) times daily.      Vitamin D-Vitamin K (D3 + K2 DOTS PO)      No current facility-administered medications on file prior to visit.    Review of Systems     Objective:  There were no vitals filed for this visit. BP Readings from Last 3 Encounters:  12/01/22 114/70  11/02/22 128/70  10/24/22 114/80   Wt Readings from Last 3 Encounters:  12/01/22 172 lb 9.6 oz (78.3 kg)  11/02/22 168 lb 12.8 oz (76.6 kg)  10/24/22 164 lb (74.4 kg)   There is no height or weight on file to calculate BMI.    Physical Exam  Assessment & Plan:    See Problem List for Assessment and Plan of chronic medical problems.

## 2023-02-28 ENCOUNTER — Encounter: Payer: Self-pay | Admitting: Internal Medicine

## 2023-02-28 ENCOUNTER — Ambulatory Visit (INDEPENDENT_AMBULATORY_CARE_PROVIDER_SITE_OTHER): Payer: Medicare Other | Admitting: Internal Medicine

## 2023-02-28 VITALS — BP 122/74 | HR 64 | Temp 98.2°F | Ht 71.0 in | Wt 171.0 lb

## 2023-02-28 DIAGNOSIS — R040 Epistaxis: Secondary | ICD-10-CM | POA: Diagnosis not present

## 2023-02-28 NOTE — Assessment & Plan Note (Addendum)
Acute 2 nose bleeds in the past week from the right nostril after blowing nose Started using aveno lotion in nostril at night Advised humidifier in bedroom, saline nasal spray and vaseline in nostrils at night Can have afrin on hand to use prn if bleeding recurs  Ok to continue ASA 81 mg daily Has ENT appt next week

## 2023-02-28 NOTE — Patient Instructions (Addendum)
     Use saline nasal spray in each nostril a few times a day  Get a humidifier for your bedroom or house  Use vaseline in the nostril at night to keep the mucus membrane moist  If the bleeding start you can use Afrin nasal spray and apply pressure as long as it takes to stop the bleeding.

## 2023-03-01 ENCOUNTER — Telehealth (INDEPENDENT_AMBULATORY_CARE_PROVIDER_SITE_OTHER): Payer: Self-pay

## 2023-03-01 NOTE — Telephone Encounter (Signed)
Patient needs pieces for his hearing aids. He wants to know if you can order these pieces for him. Please call patient for further information. [phone]

## 2023-03-02 ENCOUNTER — Telehealth (INDEPENDENT_AMBULATORY_CARE_PROVIDER_SITE_OTHER): Payer: Self-pay | Admitting: Audiology

## 2023-03-02 NOTE — Telephone Encounter (Signed)
Patient was reached over the phone and said will come to pick up the domes. He will ask for [facility].

## 2023-03-03 ENCOUNTER — Telehealth (INDEPENDENT_AMBULATORY_CARE_PROVIDER_SITE_OTHER): Payer: Self-pay | Admitting: Otolaryngology

## 2023-03-03 NOTE — Telephone Encounter (Signed)
Left vm to confirm appt and address for 03/06/2023.

## 2023-03-06 ENCOUNTER — Ambulatory Visit (INDEPENDENT_AMBULATORY_CARE_PROVIDER_SITE_OTHER): Payer: Medicare Other

## 2023-03-06 VITALS — BP 122/78 | HR 74

## 2023-03-06 DIAGNOSIS — J310 Chronic rhinitis: Secondary | ICD-10-CM

## 2023-03-06 DIAGNOSIS — J343 Hypertrophy of nasal turbinates: Secondary | ICD-10-CM

## 2023-03-06 DIAGNOSIS — R040 Epistaxis: Secondary | ICD-10-CM

## 2023-03-06 DIAGNOSIS — R0982 Postnasal drip: Secondary | ICD-10-CM | POA: Diagnosis not present

## 2023-03-06 DIAGNOSIS — H903 Sensorineural hearing loss, bilateral: Secondary | ICD-10-CM

## 2023-03-06 DIAGNOSIS — J342 Deviated nasal septum: Secondary | ICD-10-CM

## 2023-03-06 DIAGNOSIS — R0981 Nasal congestion: Secondary | ICD-10-CM

## 2023-03-07 ENCOUNTER — Encounter: Payer: Self-pay | Admitting: Cardiology

## 2023-03-07 DIAGNOSIS — J310 Chronic rhinitis: Secondary | ICD-10-CM | POA: Insufficient documentation

## 2023-03-07 DIAGNOSIS — H903 Sensorineural hearing loss, bilateral: Secondary | ICD-10-CM | POA: Insufficient documentation

## 2023-03-07 DIAGNOSIS — J343 Hypertrophy of nasal turbinates: Secondary | ICD-10-CM | POA: Insufficient documentation

## 2023-03-07 DIAGNOSIS — J342 Deviated nasal septum: Secondary | ICD-10-CM | POA: Insufficient documentation

## 2023-03-07 NOTE — Progress Notes (Signed)
Patient ID: Tom Gaines, male   DOB: May 04, 1945, 78 y.o.   MRN: [id number]  Follow-up: Hearing loss, chronic nasal congestion, postnasal drainage New complaint: Recurrent right-sided epistaxis  HPI: The patient is a 78 year old male who presents today with a new complaint of recurrent right-sided epistaxis.  The patient was previously seen for bilateral sensorineural hearing loss, chronic nasal obstruction, and postnasal drainage.  He was noted to have bilateral high-frequency sensorineural hearing loss, nasal septal deviation, bilateral inferior turbinate hypertrophy, and chronic postnasal drainage.  He was fitted with bilateral hearing aids.  He was also treated with Flonase and Atrovent nasal sprays.  According to the patient, his hearing has been stable.  The use of hearing aids have helped with his hearing.  His nasal drainage is currently under control with the medications.  However, he has noted two episodes of severe right-sided epistaxis over the past 2 weeks.  He is currently on 1 baby aspirin a day.  He has no previous nasal surgery.  He denies any nasal trauma.  Exam: General: Communicates without difficulty, well nourished, no acute distress. Head: Normocephalic, no evidence injury, no tenderness, facial buttresses intact without stepoff. Face/sinus: No tenderness to palpation and percussion. Facial movement is normal and symmetric. Eyes: PERRL, EOMI. No scleral icterus, conjunctivae clear. Neuro: CN II exam reveals vision grossly intact.  No nystagmus at any point of gaze. Ears: Auricles well formed without lesions.  Ear canals are intact without mass or lesion.  No erythema or edema is appreciated.  The TMs are intact without fluid. Nose: External evaluation reveals normal support and skin without lesions.  Dorsum is intact.  Anterior rhinoscopy reveals congested mucosa over anterior aspect of inferior turbinates and deviated septum.  Hypervascular areas are noted on the right nasal septum.   Oral:  Oral cavity and oropharynx are intact, symmetric, without erythema or edema.  Mucosa is moist without lesions. Neck: Full range of motion without pain.  There is no significant lymphadenopathy.  No masses palpable.  Thyroid bed within normal limits to palpation.  Parotid glands and submandibular glands equal bilaterally without mass.  Trachea is midline. Neuro:  CN 2-12 grossly intact.   Procedure:  Endoscopic control of recurrent right epistaxis. Indication:  Recurrent epistaxis  Description:  The right nasal cavity is sprayed with topical xylocaine and neo-synephrine.  After adequate anesthesia is achieved, the nasal cavity is examined with a 0 rigid endoscope.  A suction catheter is inserted into parallel with the 0 endoscope, and it is used to suction blood clots from the nasal cavity.  Several hypervascular areas are noted on the anterior and superior portion of the septum. Active bleeding is noted. A silver nitrate stick is inserted in parallel with the 0 endoscope.  It is used to repeatedly cauterized the hypervascular areas.  Good hemostasis is achieved.  The patient tolerated the procedure well.    Assessment: 1.  Recurrent right epistaxis.  Hypervascular areas are noted on the right anterior and superior nasal septum. 2.  Chronic rhinitis with nasal mucosal congestion, nasal septal deviation, bilateral inferior turbinate hypertrophy, and chronic postnasal drainage.  His symptoms are currently under control with medical treatment. 3.  Subjectively stable bilateral high-frequency sensorineural hearing loss.  Plan: 1.  The physical exam and nasal endoscopy findings are reviewed with the patient. 2.  Extensive cauterization of the right anterior and superior nasal septum. 3.  Continue the use of Flonase and Atrovent as needed. 4.  Continue the use of his hearing  aids. 5.  Humidifier/nasal ointment during the winter months. 6.  The patient will return for reevaluation in 1 month.

## 2023-03-08 ENCOUNTER — Emergency Department (HOSPITAL_BASED_OUTPATIENT_CLINIC_OR_DEPARTMENT_OTHER)
Admission: EM | Admit: 2023-03-08 | Discharge: 2023-03-08 | Disposition: A | Discharge status: Home / Self Care | Payer: Medicare Other | Attending: Emergency Medicine | Admitting: Emergency Medicine

## 2023-03-08 ENCOUNTER — Encounter (INDEPENDENT_AMBULATORY_CARE_PROVIDER_SITE_OTHER): Payer: Self-pay

## 2023-03-08 ENCOUNTER — Other Ambulatory Visit: Payer: Self-pay

## 2023-03-08 ENCOUNTER — Ambulatory Visit (INDEPENDENT_AMBULATORY_CARE_PROVIDER_SITE_OTHER): Payer: Medicare Other | Admitting: Otolaryngology

## 2023-03-08 ENCOUNTER — Encounter (HOSPITAL_BASED_OUTPATIENT_CLINIC_OR_DEPARTMENT_OTHER): Payer: Self-pay | Admitting: Emergency Medicine

## 2023-03-08 VITALS — BP 164/84 | HR 96 | Ht 71.0 in | Wt 165.0 lb

## 2023-03-08 DIAGNOSIS — R040 Epistaxis: Secondary | ICD-10-CM

## 2023-03-08 DIAGNOSIS — Z7982 Long term (current) use of aspirin: Secondary | ICD-10-CM | POA: Insufficient documentation

## 2023-03-08 LAB — CBC WITH DIFFERENTIAL/PLATELET
Abs Immature Granulocytes: 0.04 10*3/uL (ref 0.00–0.07)
Basophils Absolute: 0 10*3/uL (ref 0.0–0.1)
Basophils Relative: 0 %
Eosinophils Absolute: 0.2 10*3/uL (ref 0.0–0.5)
Eosinophils Relative: 2 %
HCT: 45 % (ref 39.0–52.0)
Hemoglobin: 14.7 g/dL (ref 13.0–17.0)
Immature Granulocytes: 0 %
Lymphocytes Relative: 14 %
Lymphs Abs: 1.4 10*3/uL (ref 0.7–4.0)
MCH: 32 pg (ref 26.0–34.0)
MCHC: 32.7 g/dL (ref 30.0–36.0)
MCV: 98 fL (ref 80.0–100.0)
Monocytes Absolute: 0.9 10*3/uL (ref 0.1–1.0)
Monocytes Relative: 9 %
Neutro Abs: 7.7 10*3/uL (ref 1.7–7.7)
Neutrophils Relative %: 75 %
Platelets: 200 10*3/uL (ref 150–400)
RBC: 4.59 MIL/uL (ref 4.22–5.81)
RDW: 14.6 % (ref 11.5–15.5)
WBC: 10.3 10*3/uL (ref 4.0–10.5)
nRBC: 0 % (ref 0.0–0.2)

## 2023-03-08 MED ORDER — OXYMETAZOLINE HCL 0.05 % NA SOLN
1.0000 | Freq: Once | NASAL | Status: AC
  Administered 2023-03-08: 1 via NASAL
  Filled 2023-03-08: qty 30

## 2023-03-08 MED ORDER — SILVER NITRATE-POT NITRATE 75-25 % EX MISC
1.0000 | Freq: Once | CUTANEOUS | Status: DC
  Filled 2023-03-08: qty 10

## 2023-03-08 MED ORDER — ACETAMINOPHEN 500 MG PO TABS
1000.0000 mg | ORAL_TABLET | Freq: Once | ORAL | Status: AC
  Administered 2023-03-08: 1000 mg via ORAL
  Filled 2023-03-08: qty 2

## 2023-03-08 NOTE — ED Triage Notes (Signed)
 Pt here from home with nose bleed from right nare , has had nose bleed off and on for 2 weeks , pt went ent on Monday , started again this morning , , not on blood thinners

## 2023-03-08 NOTE — Discharge Instructions (Addendum)
 Go directly to Dr. Pearson Bounds office. For recurrent bleeding follow-up with Dr. Darlin Ehrlich, you will need appointment on Friday for recheck call his office today. Use Tylenol  every 4 hours needed for pain.

## 2023-03-08 NOTE — ED Notes (Signed)
 Blood coming out of left nare , pt had it clamped for an hour prior to arrival

## 2023-03-08 NOTE — ED Provider Notes (Signed)
 [place] [facility] AT [facility] Provider Note   CSN: [id number] Arrival date & time: 03/08/23  9258     History  Chief Complaint  Patient presents with   Epistaxis    Tom Gaines is a 78 y.o. male.  Patient presents with intermittent nosebleed now persistent for 1 hour right nare.  Patient saw Dr. Neoma on Monday and had cautery.  Patient is on baby aspirin no anticoagulation.  No trauma.  The history is provided by the patient.  Epistaxis Location:  R nare Associated symptoms: no congestion, no fever and no headaches        Home Medications Prior to Admission medications   Medication Sig Start Date End Date Taking? Authorizing Provider  acyclovir (ZOVIRAX) 800 MG tablet Take 400 mg by mouth 2 (two) times daily. 09/04/20   [provider]  allopurinol  (ZYLOPRIM ) 300 MG tablet TAKE 1 TABLET BY MOUTH EVERY DAY 12/27/22   Geofm Glade PARAS, MD  Alpha-Lipoic Acid 600 MG CAPS Take by mouth.    [provider]  Ascorbic Acid (VITAMIN C) 100 MG tablet Take 100 mg by mouth daily.    [provider]  ASHWAGANDHA PO Take 300 mg by mouth 2 (two) times daily.    [provider]  ASPIRIN 81 PO Take 1 tablet by mouth daily.    [provider]  B Complex Vitamins (VITAMIN B COMPLEX PO)     [provider]  Cholecalciferol (VITAMIN D ) 2000 units CAPS Take 1 capsule by mouth daily.    [provider]  co-enzyme Q-10 30 MG capsule Take 100 mg by mouth daily.    [provider]  CREATINE MONOHYDRATE PO  10/20/22   [provider]  famotidine  (PEPCID ) 40 MG tablet Take 1 tablet (40 mg total) by mouth 2 (two) times daily. 11/02/22   Aneita Gwendlyn DASEN, MD  FOLIC ACID  PO 1,000 mcg.    [provider]  ipratropium (ATROVENT) 0.06 % nasal spray Place 2 sprays into both nostrils 2 (two) times daily. 02/25/22   [provider]  L-CITRULLINE PO Take by mouth.    [provider]   LYSINE PO Take 1,000 mg by mouth.    [provider]  Multiple Vitamin (MULTIVITAMIN) tablet Take 1 tablet by mouth daily.    [provider]  Omega-3 Fatty Acids (FISH OIL) 1000 MG CAPS Take 1,000 mg by mouth. 1 by mouth daily    [provider]  omeprazole  (PRILOSEC) 40 MG capsule Take 1 capsule (40 mg total) by mouth daily. 12/01/22   Aneita Gwendlyn DASEN, MD  Pyridoxine HCl (VITAMIN B6) 100 MG TABS     [provider]  RESVERATROL PO Take by mouth. Patient takes bid    [provider]  rosuvastatin  (CRESTOR ) 40 MG tablet Take 1 tablet (40 mg total) by mouth daily. 12/26/22   Pietro Redell RAMAN, MD  traMADol  (ULTRAM ) 50 MG tablet Take 1 tablet (50 mg total) by mouth daily. For chronic back pain Patient not taking: Reported on 03/06/2023 03/14/22   Geofm Glade PARAS, MD  Turmeric Curcumin 500 MG CAPS Take 500 mg by mouth 3 (three) times daily.     [provider]  Vitamin D -Vitamin K (D3 + K2 DOTS PO)     [provider]      Allergies    Patient has no known allergies.    Review of Systems   Review of Systems  Constitutional:  Negative for chills and fever.  HENT:  Positive for nosebleeds. Negative for congestion.   Eyes:  Negative for visual disturbance.  Respiratory:  Negative for shortness of breath.   Cardiovascular:  Negative for chest pain.  Gastrointestinal:  Negative for abdominal pain and vomiting.  Genitourinary:  Negative for dysuria and flank pain.  Musculoskeletal:  Negative for back pain, neck pain and neck stiffness.  Skin:  Negative for rash.  Neurological:  Negative for light-headedness and headaches.    Physical Exam Updated Vital Signs BP (!) 158/102   Pulse 81   Temp 98.2 F (36.8 C)   Resp 16   SpO2 95%  Physical Exam Vitals and nursing note reviewed.  Constitutional:      General: He is not in acute distress.    Appearance: He is well-developed.  HENT:     Head: Normocephalic and atraumatic.      Comments: Patient has constant bleeding and minimal clots right nare despite pressure.  No obvious source visualized.    Mouth/Throat:     Mouth: Mucous membranes are moist.  Eyes:     General:        Right eye: No discharge.        Left eye: No discharge.     Conjunctiva/sclera: Conjunctivae normal.  Neck:     Trachea: No tracheal deviation.  Cardiovascular:     Rate and Rhythm: Normal rate.  Pulmonary:     Effort: Pulmonary effort is normal.  Abdominal:     General: There is no distension.     Palpations: Abdomen is soft.  Musculoskeletal:     Cervical back: Normal range of motion and neck supple. No rigidity.  Skin:    General: Skin is warm.     Capillary Refill: Capillary refill takes less than 2 seconds.     Findings: No rash.  Neurological:     General: No focal deficit present.     Mental Status: He is alert.  Psychiatric:        Mood and Affect: Mood normal.     ED Results / Procedures / Treatments   Labs (all labs ordered are listed, but only abnormal results are displayed) Labs Reviewed  CBC WITH DIFFERENTIAL/PLATELET    EKG None  Radiology No results found.  Procedures Epistaxis Management  Date/Time: 03/08/2023 9:18 AM  Performed by: Tonia Chew, MD Authorized by: Tonia Chew, MD   Consent:    Consent obtained:  Verbal   Consent given by:  Patient   Risks, benefits, and alternatives were discussed: yes     Risks discussed:  Bleeding and pain   Alternatives discussed:  Delayed treatment Universal protocol:    Procedure explained and questions answered to patient or proxy's satisfaction: yes     Patient identity confirmed:  Arm band Anesthesia:    Anesthesia method:  None Procedure details:    Treatment site:  R anterior   Treatment method:  Nasal balloon   Treatment complexity:  Limited   Treatment episode: initial   Post-procedure details:    Assessment:  Bleeding stopped   Procedure completion:  Tolerated     Medications  Ordered in ED Medications  oxymetazoline  (AFRIN) 0.05 % nasal spray 1 spray (1 spray Each Nare Given by Other 03/08/23 0900)  acetaminophen  (TYLENOL ) tablet 1,000 mg (1,000 mg Oral Given 03/08/23 1229)    ED Course/ Medical Decision Making/ A&P  Medical Decision Making Amount and/or Complexity of Data Reviewed Labs: ordered.  Risk OTC drugs.   Patient with recurrent epistaxis presents with constant bleeding from right nare.  Pressure initiated then Afrin spray.  Followed by Darroll Ganser with 5 cc of air.  Patient has ENT to follow-up with.  Plan for CBC to check platelets and hemoglobin and recheck prior to discharge to see ENT.  Patient observed bleeding continued left nare despite Afrin and pressure.  Packing placed in the left nares well.  Patient here for 2 hours improved significantly minimal oozing mostly clear.  Discussed with Dr. Neoma who will see the patient in the office this afternoon.  Patient comfortable plan.  Tylenol  ordered for pain.  CBC reviewed independently normal platelets normal hemoglobin.       Final Clinical Impression(s) / ED Diagnoses Final diagnoses:  Epistaxis    Rx / DC Orders ED Discharge Orders     None         Tonia Chew, MD 03/08/23 1323

## 2023-03-09 ENCOUNTER — Telehealth (INDEPENDENT_AMBULATORY_CARE_PROVIDER_SITE_OTHER): Payer: Self-pay | Admitting: Otolaryngology

## 2023-03-09 NOTE — Telephone Encounter (Signed)
 Patient aware of appt and address for 03/10/2023

## 2023-03-09 NOTE — Progress Notes (Signed)
 Patient ID: Tom Gaines, male   DOB: December 22, 1945, 78 y.o.   MRN: [id number]  Follow-up: Recurrent right epistaxis  HPI: The patient is a 78 year old male who presents today for evaluation of his recurrent right epistaxis.  He was last seen 2 days ago.  At that time, he was complaining of frequent recurrent right epistaxis.  Hypervascular areas were noted on the right anterior and superior nasal septum.  The hypervascular areas were cauterized.  According to the patient, he had an episode of severe right-sided epistaxis this morning.  He was evaluated at the [facility] emergency room at [place].  A Rhino Rocket was placed in the right nasal cavity.  The bleeding has decreased.  Exam: General: Communicates without difficulty, well nourished, no acute distress. Head: Normocephalic, no evidence injury, no tenderness, facial buttresses intact without stepoff. Face/sinus: No tenderness to palpation and percussion. Facial movement is normal and symmetric. Eyes: PERRL, EOMI. No scleral icterus, conjunctivae clear. Neuro: CN II exam reveals vision grossly intact.  No nystagmus at any point of gaze. Ears: Auricles well formed without lesions.  Ear canals are intact without mass or lesion.  No erythema or edema is appreciated.  The TMs are intact without fluid. Nose: External evaluation reveals normal support and skin without lesions.  Dorsum is intact.  The right nasal cavity is packed with a Rhino Rocket.  No active bleeding is noted today.  Oral:  Oral cavity and oropharynx are intact, symmetric, without erythema or edema.  Mucosa is moist without lesions. Neck: Full range of motion without pain.  There is no significant lymphadenopathy.  No masses palpable.  Thyroid  bed within normal limits to palpation.  Parotid glands and submandibular glands equal bilaterally without mass.  Trachea is midline. Neuro:  CN 2-12 grossly intact.   Assessment: 1.  Recurrent right epistaxis.  His right nasal cavity is currently  packed with a Rhino Rocket.  No acute bleeding is noted right now.  Plan: 1.  The physical exam findings are reviewed with the patient. 2.  In light of the hemostasis, I would like to leave the [employer] in place for 2 days. 3.  He will return on Friday for removal of his Rhino Rocket packing.

## 2023-03-10 ENCOUNTER — Ambulatory Visit (INDEPENDENT_AMBULATORY_CARE_PROVIDER_SITE_OTHER): Payer: Medicare Other | Admitting: Otolaryngology

## 2023-03-10 ENCOUNTER — Encounter (INDEPENDENT_AMBULATORY_CARE_PROVIDER_SITE_OTHER): Payer: Self-pay

## 2023-03-10 VITALS — BP 126/83 | HR 81 | Ht 71.0 in | Wt 163.0 lb

## 2023-03-10 DIAGNOSIS — R040 Epistaxis: Secondary | ICD-10-CM

## 2023-03-10 NOTE — Progress Notes (Signed)
 Patient ID: Tom Gaines, male   DOB: [birth year], 78 y.o.   MRN: [id number]  Follow-up: Severe right epistaxis  Procedure:  Endoscopic control of recurrent right epistaxis  Indication:    Description: The right nasal packing is removed without difficulty.  The right nasal cavity is sprayed with topical xylocaine  and neo-synephrine.  After adequate anesthesia is achieved, the nasal cavity is examined with a 0 rigid endoscope.  A suction catheter is inserted in parallel with the 0 endoscope, and it is used to suction blood clots from the right nasal cavity.  Hypervascular areas and bleeding are noted at the posterior aspect of the nasal septum and the inferior turbinate.  A silver  nitrate stick is inserted in parallel with the 0 endoscope.  It is used to repeatedly cauterized the hypervascular areas.  Good hemostasis is achieved.  The patient tolerated the procedure well.  Assessment: 1.  Hypervascular areas are noted on the posterior aspect of the right nasal septum and the inferior turbinate. 2.  Active bleeding is noted today. 3.  No suspicious mass or lesion is noted.  Plan: 1. Endoscopic cauterization of the right nasal septum and inferior turbinate. 2. The nasal endoscopy findings are reviewed with the patient.  The patient is instructed to hold his aspirin for 5 days. 3. Nasal ointment/humidifier to treat the nasal dryness. 4. The patient will return for re-evaluation in 1 month.

## 2023-03-20 DIAGNOSIS — N529 Male erectile dysfunction, unspecified: Secondary | ICD-10-CM | POA: Diagnosis not present

## 2023-04-11 ENCOUNTER — Telehealth (INDEPENDENT_AMBULATORY_CARE_PROVIDER_SITE_OTHER): Payer: Self-pay | Admitting: Otolaryngology

## 2023-04-11 NOTE — Telephone Encounter (Signed)
 LVM to confirm appt & location [phone] afn

## 2023-04-12 ENCOUNTER — Encounter (INDEPENDENT_AMBULATORY_CARE_PROVIDER_SITE_OTHER): Payer: Self-pay

## 2023-04-12 ENCOUNTER — Ambulatory Visit (INDEPENDENT_AMBULATORY_CARE_PROVIDER_SITE_OTHER): Payer: Medicare Other

## 2023-04-12 VITALS — BP 120/67 | HR 70 | Ht 71.0 in | Wt 163.0 lb

## 2023-04-12 DIAGNOSIS — R040 Epistaxis: Secondary | ICD-10-CM | POA: Diagnosis not present

## 2023-04-13 NOTE — Progress Notes (Signed)
 Patient ID: Tom Gaines, male   DOB: 01/05/1946, 78 y.o.   MRN: [id number]  Follow-up: Recurrent right epistaxis  HPI: The patient is a 78 year old male who returns today for his follow-up evaluation.  The patient was last seen 1 month ago.  At that time, he was noted to have recurrent right epistaxis.  He underwent endoscopic cauterization of his right nasal septum and inferior turbinate.  The patient returns today reporting no significant bleeding since the procedure.  He is able to breathe through both nostrils.  He denies any recent nasal trauma.  Exam: General: Communicates without difficulty, well nourished, no acute distress. Head: Normocephalic, no evidence injury, no tenderness, facial buttresses intact without stepoff. Face/sinus: No tenderness to palpation and percussion. Facial movement is normal and symmetric. Eyes: PERRL, EOMI. No scleral icterus, conjunctivae clear. Neuro: CN II exam reveals vision grossly intact.  No nystagmus at any point of gaze. Ears: Auricles well formed without lesions.  Ear canals are intact without mass or lesion.  No erythema or edema is appreciated.  The TMs are intact without fluid. Nose: External evaluation reveals normal support and skin without lesions.  Dorsum is intact.  Anterior rhinoscopy reveals congested mucosa over anterior aspect of inferior turbinates and intact septum.  No bleeding noted. Oral:  Oral cavity and oropharynx are intact, symmetric, without erythema or edema.  Mucosa is moist without lesions. Neck: Full range of motion without pain.  There is no significant lymphadenopathy.  No masses palpable.  Thyroid bed within normal limits to palpation.  Parotid glands and submandibular glands equal bilaterally without mass.  Trachea is midline. Neuro:  CN 2-12 grossly intact.   Assessment: 1.  The patient's recurrent right epistaxis is currently under control.  No significant bleeding was noted since his last procedure. 2.  No active bleeding or  hypervascular area is noted today.  Plan: 1.  The physical exam findings are reviewed with the patient. 2.  Nasal ointment and humidifier as needed during the winter months. 3.  The patient is encouraged to call with any questions or concerns.

## 2023-04-16 ENCOUNTER — Encounter: Payer: Self-pay | Admitting: Internal Medicine

## 2023-04-19 MED ORDER — ALLOPURINOL 300 MG PO TABS
300.0000 mg | ORAL_TABLET | Freq: Every day | ORAL | 1 refills | Status: DC
Start: 2023-04-19 — End: 2023-10-23

## 2023-04-21 ENCOUNTER — Other Ambulatory Visit (INDEPENDENT_AMBULATORY_CARE_PROVIDER_SITE_OTHER)

## 2023-04-21 ENCOUNTER — Other Ambulatory Visit: Payer: Self-pay | Admitting: Internal Medicine

## 2023-04-21 ENCOUNTER — Ambulatory Visit: Payer: Medicare Other

## 2023-04-21 VITALS — Ht 71.0 in | Wt 165.0 lb

## 2023-04-21 DIAGNOSIS — Z0000 Encounter for general adult medical examination without abnormal findings: Secondary | ICD-10-CM | POA: Diagnosis not present

## 2023-04-21 DIAGNOSIS — N1832 Chronic kidney disease, stage 3b: Secondary | ICD-10-CM

## 2023-04-21 DIAGNOSIS — E7800 Pure hypercholesterolemia, unspecified: Secondary | ICD-10-CM

## 2023-04-21 DIAGNOSIS — R7303 Prediabetes: Secondary | ICD-10-CM

## 2023-04-21 LAB — CBC WITH DIFFERENTIAL/PLATELET
Basophils Absolute: 0.1 10*3/uL (ref 0.0–0.1)
Basophils Relative: 0.9 % (ref 0.0–3.0)
Eosinophils Absolute: 0.1 10*3/uL (ref 0.0–0.7)
Eosinophils Relative: 2.6 % (ref 0.0–5.0)
HCT: 42.2 % (ref 39.0–52.0)
Hemoglobin: 13.9 g/dL (ref 13.0–17.0)
Lymphocytes Relative: 21 % (ref 12.0–46.0)
Lymphs Abs: 1.2 10*3/uL (ref 0.7–4.0)
MCHC: 32.9 g/dL (ref 30.0–36.0)
MCV: 97.8 fl (ref 78.0–100.0)
Monocytes Absolute: 0.7 10*3/uL (ref 0.1–1.0)
Monocytes Relative: 12.6 % — ABNORMAL HIGH (ref 3.0–12.0)
Neutro Abs: 3.5 10*3/uL (ref 1.4–7.7)
Neutrophils Relative %: 62.9 % (ref 43.0–77.0)
Platelets: 194 10*3/uL (ref 150.0–400.0)
RBC: 4.32 Mil/uL (ref 4.22–5.81)
RDW: 15.4 % (ref 11.5–15.5)
WBC: 5.6 10*3/uL (ref 4.0–10.5)

## 2023-04-21 LAB — COMPREHENSIVE METABOLIC PANEL
ALT: 26 U/L (ref 0–53)
AST: 32 U/L (ref 0–37)
Albumin: 4.1 g/dL (ref 3.5–5.2)
Alkaline Phosphatase: 72 U/L (ref 39–117)
BUN: 31 mg/dL — ABNORMAL HIGH (ref 6–23)
CO2: 30 meq/L (ref 19–32)
Calcium: 9.3 mg/dL (ref 8.4–10.5)
Chloride: 104 meq/L (ref 96–112)
Creatinine, Ser: 1.55 mg/dL — ABNORMAL HIGH (ref 0.40–1.50)
GFR: 42.76 mL/min — ABNORMAL LOW (ref >60.00)
Glucose, Bld: 88 mg/dL (ref 70–99)
Potassium: 4.5 meq/L (ref 3.5–5.1)
Sodium: 141 meq/L (ref 135–145)
Total Bilirubin: 0.4 mg/dL (ref 0.2–1.2)
Total Protein: 6.7 g/dL (ref 6.0–8.3)

## 2023-04-21 LAB — HEMOGLOBIN A1C: Hgb A1c MFr Bld: 5.8 % (ref 4.6–6.5)

## 2023-04-21 LAB — LIPID PANEL
Cholesterol: 127 mg/dL (ref 0–200)
HDL: 74.3 mg/dL (ref >39.00)
LDL Cholesterol: 42 mg/dL (ref 0–99)
NonHDL: 52.25
Total CHOL/HDL Ratio: 2
Triglycerides: 50 mg/dL (ref 0.0–149.0)
VLDL: 10 mg/dL (ref 0.0–40.0)

## 2023-04-21 NOTE — Patient Instructions (Addendum)
 Tom Gaines , Thank you for taking time to come for your Medicare Wellness Visit. I appreciate your ongoing commitment to your health goals. Please review the following plan we discussed and let me know if I can assist you in the future.   Referrals/Orders/Follow-Ups/Clinician Recommendations: Aim for 30 minutes of exercise or brisk walking, 6-8 glasses of water, and 5 servings of fruits and vegetables each day.   This is a list of the screening recommended for you and due dates:  Health Maintenance  Topic Date Due   COVID-19 Vaccine (8 - Pfizer risk 2024-25 season) 03/31/2023   Medicare Annual Wellness Visit  04/20/2024   DTaP/Tdap/Td vaccine (3 - Td or Tdap) 08/26/2028   Pneumonia Vaccine  Completed   Flu Shot  Completed   Hepatitis C Screening  Completed   Zoster (Shingles) Vaccine  Completed   HPV Vaccine  Aged Out   Colon Cancer Screening  Discontinued    Advanced directives: (Provided) Advance directive discussed with you today. I have provided a copy for you to complete at home and have notarized. Once this is complete, please bring a copy in to our office so we can scan it into your chart.   Next Medicare Annual Wellness Visit scheduled for next year: Yes   Managing Pain Without Opioids Opioids are strong medicines used to treat moderate to severe pain. For some people, especially those who have long-term (chronic) pain, opioids may not be the best choice for pain management due to: Side effects like nausea, constipation, and sleepiness. The risk of addiction (opioid use disorder). The longer you take opioids, the greater your risk of addiction. Pain that lasts for more than 3 months is called chronic pain. Managing chronic pain usually requires more than one approach and is often provided by a team of health care providers working together (multidisciplinary approach). Pain management may be done at a pain management center or pain clinic. How to manage pain without the use of  opioids Use non-opioid medicines Non-opioid medicines for pain may include: Over-the-counter or prescription non-steroidal anti-inflammatory drugs (NSAIDs). These may be the first medicines used for pain. They work well for muscle and bone pain, and they reduce swelling. Acetaminophen. This over-the-counter medicine may work well for milder pain but not swelling. Antidepressants. These may be used to treat chronic pain. A certain type of antidepressant (tricyclics) is often used. These medicines are given in lower doses for pain than when used for depression. Anticonvulsants. These are usually used to treat seizures but may also reduce nerve (neuropathic) pain. Muscle relaxants. These relieve pain caused by sudden muscle tightening (spasms). You may also use a pain medicine that is applied to the skin as a patch, cream, or gel (topical analgesic), such as a numbing medicine. These may cause fewer side effects than medicines taken by mouth. Do certain therapies as directed Some therapies can help with pain management. They include: Physical therapy. You will do exercises to gain strength and flexibility. A physical therapist may teach you exercises to move and stretch parts of your body that are weak, stiff, or painful. You can learn these exercises at physical therapy visits and practice them at home. Physical therapy may also involve: Massage. Heat wraps or applying heat or cold to affected areas. Electrical signals that interrupt pain signals (transcutaneous electrical nerve stimulation, TENS). Weak lasers that reduce pain and swelling (low-level laser therapy). Signals from your body that help you learn to regulate pain (biofeedback). Occupational therapy. This helps you  to learn ways to function at home and work with less pain. Recreational therapy. This involves trying new activities or hobbies, such as a physical activity or drawing. Mental health therapy, including: Cognitive behavioral  therapy (CBT). This helps you learn coping skills for dealing with pain. Acceptance and commitment therapy (ACT) to change the way you think and react to pain. Relaxation therapies, including muscle relaxation exercises and mindfulness-based stress reduction. Pain management counseling. This may be individual, family, or group counseling.  Receive medical treatments Medical treatments for pain management include: Nerve block injections. These may include a pain blocker and anti-inflammatory medicines. You may have injections: Near the spine to relieve chronic back or neck pain. Into joints to relieve back or joint pain. Into nerve areas that supply a painful area to relieve body pain. Into muscles (trigger point injections) to relieve some painful muscle conditions. A medical device placed near your spine to help block pain signals and relieve nerve pain or chronic back pain (spinal cord stimulation device). Acupuncture. Follow these instructions at home Medicines Take over-the-counter and prescription medicines only as told by your health care provider. If you are taking pain medicine, ask your health care providers about possible side effects to watch out for. Do not drive or use heavy machinery while taking prescription opioid pain medicine. Lifestyle  Do not use drugs or alcohol to reduce pain. If you drink alcohol, limit how much you have to: 0-1 drink a day for women who are not pregnant. 0-2 drinks a day for men. Know how much alcohol is in a drink. In the [place] one drink equals one 12 oz bottle of beer (355 mL), one 5 oz glass of wine (148 mL), or one 1 oz glass of hard liquor (44 mL). Do not use any products that contain nicotine or tobacco. These products include cigarettes, chewing tobacco, and vaping devices, such as e-cigarettes. If you need help quitting, ask your health care provider. Eat a healthy diet and maintain a healthy weight. Poor diet and excess weight may make pain  worse. Eat foods that are high in fiber. These include fresh fruits and vegetables, whole grains, and beans. Limit foods that are high in fat and processed sugars, such as fried and sweet foods. Exercise regularly. Exercise lowers stress and may help relieve pain. Ask your health care provider what activities and exercises are safe for you. If your health care provider approves, join an exercise class that combines movement and stress reduction. Examples include yoga and tai chi. Get enough sleep. Lack of sleep may make pain worse. Lower stress as much as possible. Practice stress reduction techniques as told by your therapist. General instructions Work with all your pain management providers to find the treatments that work best for you. You are an important member of your pain management team. There are many things you can do to reduce pain on your own. Consider joining an online or in-person support group for people who have chronic pain. Keep all follow-up visits. This is important. Where to find more information You can find more information about managing pain without opioids from: American Academy of Pain Medicine: painmed.org Institute for Chronic Pain: instituteforchronicpain.org American Chronic Pain Association: theacpa.org Contact a health care provider if: You have side effects from pain medicine. Your pain gets worse or does not get better with treatments or home therapy. You are struggling with anxiety or depression. Summary Many types of pain can be managed without opioids. Chronic pain may respond better  to pain management without opioids. Pain is best managed when you and a team of health care providers work together. Pain management without opioids may include non-opioid medicines, medical treatments, physical therapy, mental health therapy, and lifestyle changes. Tell your health care providers if your pain gets worse or is not being managed well enough. This information  is not intended to replace advice given to you by your health care provider. Make sure you discuss any questions you have with your health care provider. Document Revised: 04/29/2020 Document Reviewed: 04/29/2020 Elsevier Patient Education  2024 [employer].

## 2023-04-21 NOTE — Progress Notes (Signed)
 Subjective:   Tom Gaines is a 78 y.o. who presents for a Medicare Wellness preventive visit.  Visit Complete: Virtual I connected with  Tom Gaines on 04/21/23 by a video and audio enabled telemedicine application and verified that I am speaking with the correct person using two identifiers.  Patient Location: Home  Provider Location: Office/Clinic  I discussed the limitations of evaluation and management by telemedicine. The patient expressed understanding and agreed to proceed.  Vital Signs: Because this visit was a virtual/telehealth visit, some criteria may be missing or patient reported. Any vitals not documented were not able to be obtained and vitals that have been documented are patient reported.   Persons Participating in Visit: Patient.  AWV Questionnaire: Yes: Patient Medicare AWV questionnaire was completed by the patient on 04/18/2023; I have confirmed that all information answered by patient is correct and no changes since this date.  Cardiac Risk Factors include: advanced age (>31men, >57 women);dyslipidemia;male gender     Objective:    Today's Vitals   04/21/23 1504  Weight: 165 lb (74.8 kg)  Height: 5\' 11"  (1.803 m)   Body mass index is 23.01 kg/m.     04/21/2023    3:01 PM 04/20/2022    3:09 PM 03/03/2021    3:25 PM 01/30/2020    1:42 PM 04/10/2018    4:24 PM 04/26/2017   10:06 AM 03/31/2017    4:50 PM  Advanced Directives  Does Patient Have a Medical Advance Directive? Yes Yes Yes Yes Yes No Yes  Type of Estate agent of [place];Living will Healthcare Power of [place];Living will Living will;Healthcare Power of [employer] Power of [place];Living will  Healthcare Power of [place];Living will  Does patient want to make changes to medical advance directive?   No - Patient declined No - Patient declined     Copy of Healthcare Power of Attorney in Chart? No - copy requested No - copy requested No - copy requested  No -  copy requested  No - copy requested    Current Medications (verified) Outpatient Encounter Medications as of 04/21/2023  Medication Sig   acyclovir (ZOVIRAX) 800 MG tablet Take 400 mg by mouth 2 (two) times daily.   allopurinol (ZYLOPRIM) 300 MG tablet Take 1 tablet (300 mg total) by mouth daily.   Alpha-Lipoic Acid 600 MG CAPS Take by mouth.   Ascorbic Acid (VITAMIN C) 100 MG tablet Take 100 mg by mouth daily.   ASHWAGANDHA PO Take 300 mg by mouth 2 (two) times daily.   ASPIRIN 81 PO Take 1 tablet by mouth daily.   B Complex Vitamins (VITAMIN B COMPLEX PO)    Cholecalciferol (VITAMIN D) 2000 units CAPS Take 1 capsule by mouth daily.   co-enzyme Q-10 30 MG capsule Take 100 mg by mouth daily.   CREATINE MONOHYDRATE PO    famotidine (PEPCID) 40 MG tablet Take 1 tablet (40 mg total) by mouth 2 (two) times daily.   FOLIC ACID PO 1,610 mcg.   ipratropium (ATROVENT) 0.06 % nasal spray Place 2 sprays into both nostrils 2 (two) times daily.   L-CITRULLINE PO Take by mouth.   LYSINE PO Take 1,000 mg by mouth.   Multiple Vitamin (MULTIVITAMIN) tablet Take 1 tablet by mouth daily.   Omega-3 Fatty Acids (FISH OIL) 1000 MG CAPS Take 1,000 mg by mouth. 1 by mouth daily   omeprazole (PRILOSEC) 40 MG capsule Take 1 capsule (40 mg total) by mouth daily.  Pyridoxine HCl (VITAMIN B6) 100 MG TABS    RESVERATROL PO Take by mouth. Patient takes bid   rosuvastatin (CRESTOR) 40 MG tablet Take 1 tablet (40 mg total) by mouth daily.   traMADol (ULTRAM) 50 MG tablet Take 1 tablet (50 mg total) by mouth daily. For chronic back pain   Turmeric Curcumin 500 MG CAPS Take 500 mg by mouth 3 (three) times daily.    Vitamin D-Vitamin K (D3 + K2 DOTS PO)    No facility-administered encounter medications on file as of 04/21/2023.    Allergies (verified) Patient has no known allergies.   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 LDL goal equal less than 160, ideally less than 130. No FH MI or CVA   Low back pain 03/13/2014   Chronic, has seen Dr Katrinka Blazing and Dr Charlett Blake Taking tramadol as needed   Nonallopathic lesion of lumbosacral region 12/01/2015   Nonallopathic lesion of sacral region 12/01/2015   Nonallopathic lesion of thoracic region 12/01/2015   Prediabetes 03/29/2016   Mom, sister DM   PROSTATE CANCER, HX OF 03/12/2008   Qualifier: Diagnosis of  By: Alwyn Ren MD, [facility]   Radical prostatectomy 1998, Urologist Dr. Darvin Neighbours    Past Surgical History:  Procedure Laterality Date   CATARACT EXTRACTION, BILATERAL  2017   colonoscopy with polypectomy  2014   Dr Clenton Pare [facility] SURGERY  2002   Dr Danielle Dess   PROSTATECTOMY  1998   SHOULDER ARTHROSCOPY     right; 2011; left 1990   SIGMOIDOSCOPY     TONSILLECTOMY AND ADENOIDECTOMY     Family History  Problem Relation Age of Onset   Diabetes Mother    Dementia Mother    Diabetes Sister    Lung cancer Other        maternal uncle & aunt   Healthy Daughter    Colon cancer Neg Hx    Stomach cancer Neg Hx    Arthritis Neg Hx    Heart disease Neg Hx    Stroke Neg Hx    Esophageal cancer Neg Hx    Liver cancer Neg Hx    Pancreatic cancer Neg Hx    Rectal cancer Neg Hx    Social History   Socioeconomic History    Marital status: Single    Spouse name: Not on file   Number of children: Not on file   Years of education: Not on file   Highest education level: Professional school degree (e.g., MD, DDS, DVM, JD)  Occupational History   Occupation: retired  Tobacco Use   Smoking status: Former    Current packs/day: 0.00  Types: Cigarettes    Quit date: 01/31/1985    Years since quitting: 38.2    Passive exposure: Past   Smokeless tobacco: Never   Tobacco comments:    smoked  1965-   1987, up to 2.5 ppd  Vaping Use   Vaping status: Never Used  Substance and Sexual Activity   Alcohol use: No    Comment:  none since 2012   Drug use: No   Sexual activity: Yes  Other Topics Concern   Not on file  Social History Narrative   Exercises regularly - gym 5 days a week   Social Drivers of Health   Financial Resource Strain: Low Risk  (04/21/2023)   Overall Financial Resource Strain (CARDIA)    Difficulty of Paying Living Expenses: Not hard at all  Food Insecurity: No Food Insecurity (04/21/2023)   Hunger Vital Sign    Worried About Running Out of Food in the Last Year: Never true    Ran Out of Food in the Last Year: Never true  Transportation Needs: No Transportation Needs (04/21/2023)   PRAPARE - Administrator, Civil Service (Medical): No    Lack of Transportation (Non-Medical): No  Physical Activity: Sufficiently Active (04/21/2023)   Exercise Vital Sign    Days of Exercise per Week: 5 days    Minutes of Exercise per Session: 60 min  Stress: No Stress Concern Present (04/21/2023)   [employer] of Occupational Health - Occupational Stress Questionnaire    Feeling of Stress : Not at all  Social Connections: Moderately Integrated (04/21/2023)   Social Connection and Isolation Panel [NHANES]    Frequency of Communication with Friends and Family: More than three times a week    Frequency of Social Gatherings with Friends and Family: More than three times a week    Attends Religious  Services: Never    Database administrator or Organizations: Yes    Attends Engineer, structural: More than 4 times per year    Marital Status: Married  Recent Concern: Social Connections - Socially Isolated (04/18/2023)   Social Connection and Isolation Panel [NHANES]    Frequency of Communication with Friends and Family: Twice a week    Frequency of Social Gatherings with Friends and Family: Twice a week    Attends Religious Services: Never    Database administrator or Organizations: No    Attends Engineer, structural: Patient declined    Marital Status: Widowed    Tobacco Counseling Counseling given: No Tobacco comments: smoked  1965-   1987, up to 2.5 ppd    Clinical Intake:  Pre-visit preparation completed: Yes  Pain : No/denies pain     BMI - recorded: 23.01 Nutritional Risks: None Diabetes: No  Lab Results  Component Value Date   HGBA1C 5.8 04/21/2023   HGBA1C 5.9 10/25/2022   HGBA1C 6.0 04/20/2022     How often do you need to have someone help you when you read instructions, pamphlets, or other written materials from your doctor or pharmacy?: 1 - Never  Interpreter Needed?: No  Information entered by :: Hassell Halim, CMA   Activities of Daily Living     04/21/2023    3:06 PM 04/18/2023    9:14 PM  In your present state of health, do you have any difficulty performing the following activities:  Hearing? 0 0  Vision? 0 0  Difficulty concentrating or making decisions? 0 0  Walking or climbing stairs? 0 0  Dressing or bathing? 0 0  Doing errands, shopping? 0 0  Preparing Food and eating ? N N  Using the Toilet? N N  In the past six months, have you accidently leaked urine? N N  Do you have problems with loss of bowel control? N N  Managing your Medications? N N  Managing your Finances? N N  Housekeeping or managing your Housekeeping? N N    Patient Care Team: Pincus Sanes, MD as PCP - General (Internal Medicine) Jens Som Madolyn Frieze, MD as PCP - Cardiology (Cardiology) Ernesto Rutherford, MD as Consulting Physician (Ophthalmology)  Indicate any recent Medical Services you may have received from other than [facility] providers in the past year (date may be approximate).     Assessment:   This is a routine wellness examination for Tom Gaines.  Hearing/Vision screen Hearing Screening - Comments:: Denies hearing difficulties   Vision Screening - Comments:: Wears rx glasses - up to date with routine eye exams with Dr Ernesto Rutherford   Goals Addressed               This Visit's Progress     Patient Stated (pt-stated)        Patient stated he plans to stay healthy.       Depression Screen     04/21/2023    3:09 PM 02/28/2023    3:46 PM 04/20/2022    2:43 PM 10/20/2021    3:30 PM 03/03/2021    3:24 PM 01/30/2020    1:41 PM 01/06/2020    3:43 PM  PHQ 2/9 Scores  PHQ - 2 Score 1 0 0 0 0 0 0  PHQ- 9 Score 2  0        Fall Risk     04/21/2023    3:11 PM 04/18/2023    9:14 PM 02/28/2023    3:46 PM 04/20/2022    3:10 PM 10/20/2021    3:29 PM  Fall Risk   Falls in the past year? 1 1 0 0 0  Number falls in past yr: 0 0 0 0 0  Injury with Fall? 1 0 0 0 0  Risk for fall due to :   No Fall Risks No Fall Risks No Fall Risks  Follow up Falls prevention discussed;Falls evaluation completed  Falls evaluation completed Falls prevention discussed Falls evaluation completed    MEDICARE RISK AT HOME:  Medicare Risk at Home Any stairs in or around the home?: Yes If so, are there any without handrails?: Yes Home free of loose throw rugs in walkways, pet beds, electrical cords, etc?: No Adequate lighting in your home to reduce risk of falls?: Yes Life alert?: No Use of a cane, walker or w/c?: No Grab bars in the bathroom?: Yes Shower chair or bench in shower?: Yes Elevated toilet seat or a handicapped toilet?: No  TIMED UP AND GO:  Was the test performed?  No  Cognitive Function: 6CIT completed        04/21/2023    3:12 PM  04/20/2022    3:10 PM  6CIT Screen  What Year? 0 points 0 points  What month? 0 points 0 points  What time? 0 points 0 points  Count back from 20 0 points 0 points  Months in reverse 0 points 0 points  Repeat phrase 0 points 0 points  Total Score 0 points 0 points    Immunizations Immunization History  Administered Date(s) Administered   Fluad Quad(high Dose 65+) 10/04/2018, 12/13/2021  Influenza Whole May 17, 1945, 10/08/2008, 11/08/2011   Influenza, High Dose Seasonal PF 10/22/2012, 11/20/2015, 10/03/2017, 10/01/2022   Influenza,inj,Quad PF,6+ Mos 10/10/2014   Influenza-Unspecified 11/14/2016, 11/09/2020   PFIZER Comirnaty(Frix Top)Covid-19 Tri-Sucrose Vaccine 01/15/2022   PFIZER(Purple Top)SARS-COV-2 Vaccination 02/22/2019, 03/15/2019, 11/06/2019, 06/18/2020   Pfizer Covid-19 Vaccine Bivalent Booster 58yrs & up 11/09/2020   Pfizer(Comirnaty)Fall Seasonal Vaccine 12 years and older 10/01/2022   Pneumococcal Conjugate-13 10/10/2014   Pneumococcal Polysaccharide-23 02/13/2013, 03/19/2020   Rsv, Mab, Judi Cong, 0.5 Ml, Neonate To 24 Mos(Beyfortus) 12/13/2021   Td 03/12/2008   Tdap 08/27/2018   Zoster Recombinant(Shingrix) 05/27/2016, 08/31/2016   Zoster, Live 05/30/2006    Screening Tests Health Maintenance  Topic Date Due   COVID-19 Vaccine (8 - Pfizer risk 2024-25 season) 03/31/2023   Medicare Annual Wellness (AWV)  04/20/2024   DTaP/Tdap/Td (3 - Td or Tdap) 08/26/2028   Pneumonia Vaccine 51+ Years old  Completed   INFLUENZA VACCINE  Completed   Hepatitis C Screening  Completed   Zoster Vaccines- Shingrix  Completed   HPV VACCINES  Aged Out   Colonoscopy  Discontinued    Health Maintenance  Health Maintenance Due  Topic Date Due   COVID-19 Vaccine (8 - Pfizer risk 2024-25 season) 03/31/2023   Health Maintenance Items Addressed: 04/21/2023  Additional Screening:  Vision Screening: Recommended annual ophthalmology exams for early detection of glaucoma and  other disorders of the eye.  Dental Screening: Recommended annual dental exams for proper oral hygiene  Community Resource Referral / Chronic Care Management: CRR required this visit?  No   CCM required this visit?  No     Plan:     I have personally reviewed and noted the following in the patient's chart:   Medical and social history Use of alcohol, tobacco or illicit drugs  Current medications and supplements including opioid prescriptions. Patient is currently taking opioid prescriptions. Information provided to patient regarding non-opioid alternatives. Patient advised to discuss non-opioid treatment plan with their provider. Functional ability and status Nutritional status Physical activity Advanced directives List of other physicians Hospitalizations, surgeries, and ER visits in previous 12 months Vitals Screenings to include cognitive, depression, and falls Referrals and appointments  In addition, I have reviewed and discussed with patient certain preventive protocols, quality metrics, and best practice recommendations. A written personalized care plan for preventive services as well as general preventive health recommendations were provided to patient.     Darreld Mclean, CMA   04/21/2023   After Visit Summary: (MyChart) Due to this being a telephonic visit, the after visit summary with patients personalized plan was offered to patient via MyChart   Notes: Nothing significant to report at this time.

## 2023-04-23 NOTE — Patient Instructions (Addendum)
      Blood work was ordered for next visit.       Medications changes include :   None    A referral was ordered and someone will call you to schedule an appointment.     Return in about 6 months (around 10/25/2023) for follow up.

## 2023-04-23 NOTE — Progress Notes (Unsigned)
 Subjective:    Patient ID: Tom Gaines, male    DOB: 03/01/1945, 78 y.o.   MRN: [id number]     HPI Tom Gaines is here for follow up of his chronic medical problems.  Had some back pain - had some body work and it helped.  He would like to go back to PT - will go back to see Dr Clyda Hurdle.   Right plantar foot at ball of foot - small area of dryness / callus or wart   Medications and allergies reviewed with patient and updated if appropriate.  Current Outpatient Medications on File Prior to Visit  Medication Sig Dispense Refill   acyclovir (ZOVIRAX) 800 MG tablet Take 400 mg by mouth 2 (two) times daily.     allopurinol (ZYLOPRIM) 300 MG tablet Take 1 tablet (300 mg total) by mouth daily. 90 tablet 1   Alpha-Lipoic Acid 600 MG CAPS Take by mouth.     Ascorbic Acid (VITAMIN C) 100 MG tablet Take 100 mg by mouth daily.     ASHWAGANDHA PO Take 300 mg by mouth 2 (two) times daily.     ASPIRIN 81 PO Take 1 tablet by mouth daily.     B Complex Vitamins (VITAMIN B COMPLEX PO)      Cholecalciferol (VITAMIN D) 2000 units CAPS Take 1 capsule by mouth daily.     co-enzyme Q-10 30 MG capsule Take 100 mg by mouth daily.     CREATINE MONOHYDRATE PO      famotidine (PEPCID) 40 MG tablet Take 1 tablet (40 mg total) by mouth 2 (two) times daily. 60 tablet 11   FOLIC ACID PO 2,841 mcg.     ipratropium (ATROVENT) 0.06 % nasal spray Place 2 sprays into both nostrils 2 (two) times daily.     L-CITRULLINE PO Take by mouth.     LYSINE PO Take 1,000 mg by mouth.     Multiple Vitamin (MULTIVITAMIN) tablet Take 1 tablet by mouth daily.     Omega-3 Fatty Acids (FISH OIL) 1000 MG CAPS Take 1,000 mg by mouth. 1 by mouth daily     omeprazole (PRILOSEC) 40 MG capsule Take 1 capsule (40 mg total) by mouth daily. 90 capsule 3   Pyridoxine HCl (VITAMIN B6) 100 MG TABS      RESVERATROL PO Take by mouth. Patient takes bid     rosuvastatin (CRESTOR) 40 MG tablet Take 1 tablet (40 mg total) by mouth daily. 90  tablet 3   traMADol (ULTRAM) 50 MG tablet Take 1 tablet (50 mg total) by mouth daily. For chronic back pain 90 tablet 0   Turmeric Curcumin 500 MG CAPS Take 500 mg by mouth 3 (three) times daily.      Vitamin D-Vitamin K (D3 + K2 DOTS PO)      No current facility-administered medications on file prior to visit.     Review of Systems  Constitutional:  Negative for fever.  Respiratory:  Negative for cough, shortness of breath and wheezing.   Cardiovascular:  Negative for chest pain and palpitations.  Musculoskeletal:  Positive for back pain and neck pain (occ - improves with movement).       Objective:   Vitals:   04/24/23 0903  BP: 114/68  Pulse: 80  Temp: 98.3 F (36.8 C)  SpO2: 97%   BP Readings from Last 3 Encounters:  04/24/23 114/68  04/12/23 120/67  03/10/23 126/83   Wt Readings from Last 3 Encounters:  04/24/23  172 lb (78 kg)  04/21/23 165 lb (74.8 kg)  04/12/23 163 lb (73.9 kg)   Body mass index is 23.99 kg/m.    Physical Exam Constitutional:      General: He is not in acute distress.    Appearance: Normal appearance. He is not ill-appearing.  HENT:     Head: Normocephalic and atraumatic.  Eyes:     Conjunctiva/sclera: Conjunctivae normal.  Cardiovascular:     Rate and Rhythm: Normal rate and regular rhythm.     Heart sounds: Normal heart sounds.  Pulmonary:     Effort: Pulmonary effort is normal. No respiratory distress.     Breath sounds: Normal breath sounds. No wheezing or rales.  Musculoskeletal:     Right lower leg: No edema.     Left lower leg: No edema.  Skin:    General: Skin is warm and dry.     Findings: No rash.     Comments: Right plantar surface circular area approximately 2 mm-appears dry and flat  Neurological:     Mental Status: He is alert. Mental status is at baseline.  Psychiatric:        Mood and Affect: Mood normal.        Lab Results  Component Value Date   WBC 5.6 04/21/2023   HGB 13.9 04/21/2023   HCT 42.2  04/21/2023   PLT 194.0 04/21/2023   GLUCOSE 88 04/21/2023   CHOL 127 04/21/2023   TRIG 50.0 04/21/2023   HDL 74.30 04/21/2023   LDLDIRECT 83.0 10/09/2019   LDLCALC 42 04/21/2023   ALT 26 04/21/2023   AST 32 04/21/2023   NA 141 04/21/2023   K 4.5 04/21/2023   CL 104 04/21/2023   CREATININE 1.55 (H) 04/21/2023   BUN 31 (H) 04/21/2023   CO2 30 04/21/2023   TSH 1.60 10/19/2021   PSA 0.60 03/01/2010   HGBA1C 5.8 04/21/2023     Assessment & Plan:    See Problem List for Assessment and Plan of chronic medical problems.

## 2023-04-24 ENCOUNTER — Ambulatory Visit (INDEPENDENT_AMBULATORY_CARE_PROVIDER_SITE_OTHER): Payer: Medicare Other | Admitting: Internal Medicine

## 2023-04-24 ENCOUNTER — Encounter: Payer: Self-pay | Admitting: Internal Medicine

## 2023-04-24 VITALS — BP 114/68 | HR 80 | Temp 98.3°F | Ht 71.0 in | Wt 172.0 lb

## 2023-04-24 DIAGNOSIS — M545 Low back pain, unspecified: Secondary | ICD-10-CM | POA: Diagnosis not present

## 2023-04-24 DIAGNOSIS — G8929 Other chronic pain: Secondary | ICD-10-CM

## 2023-04-24 DIAGNOSIS — R7303 Prediabetes: Secondary | ICD-10-CM

## 2023-04-24 DIAGNOSIS — M79671 Pain in right foot: Secondary | ICD-10-CM | POA: Diagnosis not present

## 2023-04-24 DIAGNOSIS — N1832 Chronic kidney disease, stage 3b: Secondary | ICD-10-CM

## 2023-04-24 DIAGNOSIS — Z8739 Personal history of other diseases of the musculoskeletal system and connective tissue: Secondary | ICD-10-CM | POA: Diagnosis not present

## 2023-04-24 DIAGNOSIS — E7800 Pure hypercholesterolemia, unspecified: Secondary | ICD-10-CM | POA: Diagnosis not present

## 2023-04-24 DIAGNOSIS — R7983 Abnormal findings of blood amino-acid level: Secondary | ICD-10-CM

## 2023-04-24 NOTE — Assessment & Plan Note (Signed)
 Chronic Improved with supplementation of a few vitamin B's Continue folate, B12 and B6 supplementation Will check homocystine level at his next visit

## 2023-04-24 NOTE — Assessment & Plan Note (Addendum)
 Chronic Doing back exercises regularly Pain controlled Continue tramadol 50 mg daily as needed Continue Tylenol as needed Will refer to PT as needed

## 2023-04-24 NOTE — Assessment & Plan Note (Signed)
 Chronic Coronary calcium score 320 Regular exercise and healthy diet  Lab Results  Component Value Date   LDLCALC 42 04/21/2023   Lipids well controlled and at goal Continue Crestor 40 mg daily

## 2023-04-24 NOTE — Assessment & Plan Note (Signed)
 New Has a 2 mm circular lesion on the plantar surface of his right foot-it is tender Referral to podiatry

## 2023-04-24 NOTE — Assessment & Plan Note (Signed)
 Chronic Denies episodes of gout Continue allopurinol 300 mg daily Check uric acid level at his next visit

## 2023-04-24 NOTE — Assessment & Plan Note (Signed)
 Chronic Following with nephrology Blood work done-reviewed Overall GFR stable

## 2023-04-24 NOTE — Assessment & Plan Note (Signed)
 Chronic Lab Results  Component Value Date   HGBA1C 5.8 04/21/2023   Sugars stable in prediabetic range Continue healthy diet, regular exercise

## 2023-04-25 ENCOUNTER — Encounter: Payer: Self-pay | Admitting: Internal Medicine

## 2023-05-02 ENCOUNTER — Ambulatory Visit (INDEPENDENT_AMBULATORY_CARE_PROVIDER_SITE_OTHER): Admitting: Podiatry

## 2023-05-02 ENCOUNTER — Ambulatory Visit

## 2023-05-02 ENCOUNTER — Encounter: Payer: Self-pay | Admitting: Podiatry

## 2023-05-02 DIAGNOSIS — M1A4711 Other secondary chronic gout, right ankle and foot, with tophus (tophi): Secondary | ICD-10-CM

## 2023-05-02 DIAGNOSIS — M79671 Pain in right foot: Secondary | ICD-10-CM | POA: Diagnosis not present

## 2023-05-02 NOTE — Progress Notes (Signed)
  Subjective:  Patient ID: Tom Gaines, male    DOB: [birth year],  MRN: [id number]  Chief Complaint  Patient presents with   Foot Pain    Patient states he has a callous on the bottom of right foot and patient states he has gout in right foot also, patient has had this for a while now     Discussed the use of AI scribe software for clinical note transcription with the patient, who gave verbal consent to proceed.  History of Present Illness Tom Gaines "[name]" is a 78 year old male with gout who presents with calluses on the right foot. He was referred by Dr. Lawerance Bach for evaluation of foot pain and calluses.  He has calluses on the right foot, particularly on the ball of the foot, which have been present for some time. The calluses were initially thought to be pressing on something causing pain, described as constant in the right foot for weeks, but it is not currently bothering him. He has also noticed a callus developing on the left foot in the same spot over the past year, though it is not painful.  He has a history of gout, which is well controlled with daily allopurinol, and has not experienced any recent flares. He mentions that the knuckle joint on the left foot has not started protruding like it does on the right.  He engages in low-impact exercise, such as using the [employer] at the [facility], and does not run. He has not used corn remover pads or medicated creams for the calluses before. No pain in the left foot and no recent gout flares.      Objective:    Physical Exam VASCULAR: DP and PT pulse palpable. Foot is warm and well-perfused. Capillary fill time is brisk. DERMATOLOGIC: Normal skin turgor, texture, and temperature. No open lesions, rashes, or ulcerations. NEUROLOGIC: Normal sensation to light touch and pressure. No paresthesias on examination. ORTHOPEDIC:No pain to palpation. Callus present on right foot. Good range of motion in first and fifth MTP joints.   No  images are attached to the encounter.    Results Procedure: Callus Trimming Description: Trimmed callus on the right foot, removed hard skin, and identified a central core. Informed Consent: Discussed the use of salicylic acid for callus treatment, including its benefits in exfoliating deeper layers of skin and alternatives such as over-the-counter corn remover pads.  RADIOLOGY Right foot radiographs: Hallux valgus forming, Tailor's bunion deformity, calcified depositions in the first and fifth MTP joints (05/02/2023)   Assessment:   1. Foot pain, right      Plan:  Patient was evaluated and treated and all questions answered.  Assessment and Plan Assessment & Plan Callus formation on right foot Callus on the ball of the right foot due to pressure from the second metatarsal, exacerbated by a bunion deformity. Currently non-painful but has a central core that could become painful.  -debrided lesion as a courtesy -educated on etiology and offloading -F/U PRN -XR reviewed with patient and noted above. Gout relatively asymptomatic currently        Return if symptoms worsen or fail to improve.

## 2023-05-02 NOTE — Patient Instructions (Signed)
 VISIT SUMMARY:  Today, you were seen for calluses on your right foot and discussed your history of gout. The calluses, particularly on the ball of your right foot, have been present for some time but are not currently causing pain. You also mentioned a developing callus on your left foot, which is not painful. Your gout is well controlled with daily allopurinol, and you have not experienced any recent flares.  YOUR PLAN:  -CALLUS FORMATION ON RIGHT FOOT: A callus is a thickened and hardened part of the skin that forms due to repeated pressure or friction. The callus on your right foot is due to pressure from the second metatarsal bone, worsened by a bunion deformity. To treat it, you should trim the callus to remove the hard skin, apply salicylic acid to the callus, and cover it with a Band-Aid for 24 hours. Over-the-counter corn remover pads with salicylic acid are recommended for future use. After 24 hours, remove the Band-Aid, wash the area with soap and water, and expect a disc of skin to peel off in the next few days.  -BUNION DEFORMITY: A bunion is a bony bump that forms on the joint at the base of your big toe. The bunion on your right foot is contributing to the pressure on the second metatarsal and the formation of the callus. Currently, it is not causing pain or limiting your range of motion.  -GOUT: Gout is a form of arthritis characterized by sudden, severe attacks of pain, redness, and tenderness in joints, caused by the accumulation of urate crystals. Your gout is well controlled with daily allopurinol, and you have not experienced any recent flares. Continue taking allopurinol daily as prescribed.  INSTRUCTIONS:  Please follow the treatment plan for your callus as discussed. Continue taking allopurinol daily for your gout. If you experience any new symptoms or if the callus becomes painful, please schedule a follow-up appointment.

## 2023-05-11 DIAGNOSIS — R972 Elevated prostate specific antigen [PSA]: Secondary | ICD-10-CM | POA: Diagnosis not present

## 2023-05-11 DIAGNOSIS — C61 Malignant neoplasm of prostate: Secondary | ICD-10-CM | POA: Diagnosis not present

## 2023-05-11 DIAGNOSIS — N529 Male erectile dysfunction, unspecified: Secondary | ICD-10-CM | POA: Diagnosis not present

## 2023-05-21 ENCOUNTER — Encounter: Payer: Self-pay | Admitting: Internal Medicine

## 2023-05-21 NOTE — Progress Notes (Unsigned)
    Subjective:    Patient ID: Tom Gaines, male    DOB: [birth year], 78 y.o.   MRN: [id number]      HPI Roxy is here for No chief complaint on file.   Eggs sized lump right groin -     Medications and allergies reviewed with patient and updated if appropriate.  Current Outpatient Medications on File Prior to Visit  Medication Sig Dispense Refill   acyclovir (ZOVIRAX) 800 MG tablet Take 400 mg by mouth 2 (two) times daily.     allopurinol  (ZYLOPRIM ) 300 MG tablet Take 1 tablet (300 mg total) by mouth daily. 90 tablet 1   Alpha-Lipoic Acid 600 MG CAPS Take by mouth.     Ascorbic Acid (VITAMIN C) 100 MG tablet Take 100 mg by mouth daily.     ASHWAGANDHA PO Take 300 mg by mouth 2 (two) times daily.     ASPIRIN 81 PO Take 1 tablet by mouth daily.     B Complex Vitamins (VITAMIN B COMPLEX PO)      Cholecalciferol (VITAMIN D ) 2000 units CAPS Take 1 capsule by mouth daily.     co-enzyme Q-10 30 MG capsule Take 100 mg by mouth daily.     CREATINE MONOHYDRATE PO      famotidine  (PEPCID ) 40 MG tablet Take 1 tablet (40 mg total) by mouth 2 (two) times daily. 60 tablet 11   FOLIC ACID  PO 1,000 mcg.     ipratropium (ATROVENT) 0.06 % nasal spray Place 2 sprays into both nostrils 2 (two) times daily.     L-CITRULLINE PO Take by mouth.     LYSINE PO Take 1,000 mg by mouth.     Multiple Vitamin (MULTIVITAMIN) tablet Take 1 tablet by mouth daily.     Omega-3 Fatty Acids (FISH OIL) 1000 MG CAPS Take 1,000 mg by mouth. 1 by mouth daily     omeprazole  (PRILOSEC) 40 MG capsule Take 1 capsule (40 mg total) by mouth daily. 90 capsule 3   Pyridoxine HCl (VITAMIN B6) 100 MG TABS      RESVERATROL PO Take by mouth. Patient takes bid     rosuvastatin  (CRESTOR ) 40 MG tablet Take 1 tablet (40 mg total) by mouth daily. 90 tablet 3   traMADol  (ULTRAM ) 50 MG tablet Take 1 tablet (50 mg total) by mouth daily. For chronic back pain 90 tablet 0   Turmeric Curcumin 500 MG CAPS Take 500 mg by mouth 3 (three)  times daily.      Vitamin D -Vitamin K (D3 + K2 DOTS PO)      No current facility-administered medications on file prior to visit.    Review of Systems     Objective:  There were no vitals filed for this visit. BP Readings from Last 3 Encounters:  04/24/23 114/68  04/12/23 120/67  03/10/23 126/83   Wt Readings from Last 3 Encounters:  04/24/23 172 lb (78 kg)  04/21/23 165 lb (74.8 kg)  04/12/23 163 lb (73.9 kg)   There is no height or weight on file to calculate BMI.    Physical Exam         Assessment & Plan:    See Problem List for Assessment and Plan of chronic medical problems.

## 2023-05-22 ENCOUNTER — Ambulatory Visit (INDEPENDENT_AMBULATORY_CARE_PROVIDER_SITE_OTHER): Admitting: Internal Medicine

## 2023-05-22 VITALS — BP 114/80 | HR 66 | Temp 98.0°F | Ht 71.0 in | Wt 170.0 lb

## 2023-05-22 DIAGNOSIS — K4090 Unilateral inguinal hernia, without obstruction or gangrene, not specified as recurrent: Secondary | ICD-10-CM | POA: Diagnosis not present

## 2023-05-22 DIAGNOSIS — S40861A Insect bite (nonvenomous) of right upper arm, initial encounter: Secondary | ICD-10-CM | POA: Insufficient documentation

## 2023-05-22 DIAGNOSIS — W57XXXA Bitten or stung by nonvenomous insect and other nonvenomous arthropods, initial encounter: Secondary | ICD-10-CM

## 2023-05-22 MED ORDER — DOXYCYCLINE HYCLATE 100 MG PO TABS: 200.0000 mg | ORAL_TABLET | Freq: Once | ORAL | 0 refills | Status: AC

## 2023-05-22 NOTE — Assessment & Plan Note (Signed)
 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

## 2023-05-22 NOTE — Assessment & Plan Note (Signed)
 Acute Yesterday had a tick bite-he did remove the tick himself It was very small and unlikely to have been on for so long Will go ahead and give him doxycycline  200 mg x 1 Will watch for ticks since he is outside a lot

## 2023-05-22 NOTE — Assessment & Plan Note (Signed)
 Acute Removed a tick from his right upper arm last night He had been outside that day and it was unlikely to have been a long, but hard to know for sure Mild erythema at the site of the bite which is likely just reaction to the bite Will prescribe doxycycline  200 mg x 1 He will monitor the area closely and let me know if there is any concerns.

## 2023-05-22 NOTE — Patient Instructions (Addendum)
 Doxycyline sent to your pharmacy.   A referral was ordered surgery and someone will call you to schedule an appointment.    Groin Hernia (Inguinal Hernia) in Adults: What to Know  A hernia happens when an organ or tissue inside your body pushes out through a weak spot in the muscles of your belly. This makes a bulge. A groin hernia, or inguinal hernia, is found in the groin, which is the area where your leg meets your lower belly. If you're male, it may be in your scrotum. If you're male, it may be in the folds of skin around your vagina. There are three types: Reducible. This means the hernia can be pushed back into your belly. Incarcerated. This means it can't be pushed back into your belly. Strangulated. This means it can't be pushed back into your belly and blood flow is cut off to the tissues inside it. If you get this type of hernia, you'll need surgery right away. What are the causes? A groin hernia may happen when you strain your lower belly muscles, such as when you: Lift a heavy object. Strain to poop. Cough. You can be born with a groin hernia, or it can form over time. What increases the risk? You're more likely to get a groin hernia if: You're male. You're pregnant or have been pregnant. You're 50 years or older. You've had a groin hernia or belly surgery before. You smoke. You're overweight. You work at a job where you need to stand a lot or lift heavy things. What are the signs or symptoms? Symptoms may depend on how big the hernia is. If it's small, you may not have symptoms. If it's bigger, you may have: A bulge near your groin or genitals. Pain or burning in your groin. A dull ache or feeling of pressure in your groin. If blood flow is cut off to the tissues inside the hernia, you may also: Feel pain and tenderness when you touch the bulge. The skin over it may turn red or purple. Have a fever. Throw up or feel like you may throw up. Have trouble pooping or  passing gas. How is this diagnosed? You may be diagnosed based on your symptoms, medical history, and an exam. During the exam, you may be asked to cough or strain to force the bulge to stick out. Your health care provider may try to push the bulge back in. You may also need tests. These may include: An ultrasound. An MRI. A CT scan. How is this treated? Treatment depends on how big the hernia is and what symptoms you have. You may need: To be watched to see if the bulge grows bigger. Surgery. This may be done if the hernia is big or if you have symptoms. Follow these instructions at home: Lifestyle Ask if it's OK for you to lift. Try not to stand for long periods of time. Do not smoke, vape, or use nicotine or tobacco. Stay at a healthy weight. Try not to do things that put pressure on your hernia. Preventing trouble pooping You may need to take these steps to help prevent or treat trouble pooping (constipation): Take medicines to help you poop. Eat foods high in fiber, like beans, whole grains, and fresh fruits and vegetables. Drink more fluids as told. General instructions Try to push the hernia back in place by very gently pressing on it while lying down. Do not try to force it back in if it won't push in easily. Watch  your hernia for any changes in: Shape. Size. Color. Take your medicines only as told. Contact a health care provider if: You have a fever. You have new symptoms. Your symptoms get worse. You can't poop or pass gas. Get help right away if: Your bulge: Starts to hurt a lot. Changes color. You have sudden pain in your scrotum, or the size of your scrotum changes all of a sudden. You can't gently push the hernia back in place. You keep throwing up or feeling like you need to throw up. These symptoms may be an emergency. Call 911 right away. Do not wait to see if the symptoms will go away. Do not drive yourself to the hospital. This information is not  intended to replace advice given to you by your health care provider. Make sure you discuss any questions you have with your health care provider. Document Revised: 09/15/2022 Document Reviewed: 09/15/2022 Elsevier Patient Education  2024 [employer].

## 2023-05-25 ENCOUNTER — Encounter: Payer: Self-pay | Admitting: Internal Medicine

## 2023-05-26 ENCOUNTER — Other Ambulatory Visit: Payer: Self-pay

## 2023-05-26 MED ORDER — AZELASTINE HCL 137 MCG/SPRAY NA SOLN: NASAL | 3 refills | Status: DC

## 2023-05-31 DIAGNOSIS — N1831 Chronic kidney disease, stage 3a: Secondary | ICD-10-CM | POA: Diagnosis not present

## 2023-05-31 DIAGNOSIS — R7303 Prediabetes: Secondary | ICD-10-CM | POA: Diagnosis not present

## 2023-05-31 DIAGNOSIS — E7211 Homocystinuria: Secondary | ICD-10-CM | POA: Diagnosis not present

## 2023-05-31 DIAGNOSIS — R5383 Other fatigue: Secondary | ICD-10-CM | POA: Diagnosis not present

## 2023-05-31 DIAGNOSIS — N2581 Secondary hyperparathyroidism of renal origin: Secondary | ICD-10-CM | POA: Diagnosis not present

## 2023-05-31 DIAGNOSIS — M109 Gout, unspecified: Secondary | ICD-10-CM | POA: Diagnosis not present

## 2023-05-31 DIAGNOSIS — I77810 Thoracic aortic ectasia: Secondary | ICD-10-CM | POA: Diagnosis not present

## 2023-05-31 DIAGNOSIS — I5189 Other ill-defined heart diseases: Secondary | ICD-10-CM | POA: Diagnosis not present

## 2023-05-31 DIAGNOSIS — E785 Hyperlipidemia, unspecified: Secondary | ICD-10-CM | POA: Diagnosis not present

## 2023-05-31 DIAGNOSIS — K2270 Barrett's esophagus without dysplasia: Secondary | ICD-10-CM | POA: Diagnosis not present

## 2023-05-31 LAB — LAB REPORT - SCANNED: EGFR: 60

## 2023-06-07 ENCOUNTER — Telehealth: Payer: Self-pay

## 2023-06-07 ENCOUNTER — Ambulatory Visit: Payer: Self-pay | Admitting: Surgery

## 2023-06-07 DIAGNOSIS — K4090 Unilateral inguinal hernia, without obstruction or gangrene, not specified as recurrent: Secondary | ICD-10-CM | POA: Diagnosis not present

## 2023-06-07 NOTE — Telephone Encounter (Signed)
   Pre-operative Risk Assessment    Patient Name: Tom Gaines  DOB: [birth year] MRN: [id number]   Date of last office visit: 12/01/22 Eben Golds, MD Date of next office visit: NONE   Request for Surgical Clearance    Procedure:   HERNIA REPAIR  Date of Surgery:  Clearance TBD                                Surgeon:  Dareen Ebbing, MD Surgeon's Group or Practice Name:  [facility] Phone number:  [phone] Fax number:  [phone]   ATTN: Columbus Debar, LPN   Type of Clearance Requested:   - Medical  - Pharmacy:  Hold Aspirin     Type of Anesthesia:  General    Additional requests/questions:    Signed, Collin Deal   06/07/2023, 5:44 PM

## 2023-06-07 NOTE — H&P (Signed)
 Subjective    Chief Complaint: New Consultation ( Eval of RIH,)       History of Present Illness: Tom Gaines is a 78 y.o. male who is seen today as an office consultation at the request of Dr. Donnette Gal for evaluation of New Consultation ( Eval of RIH,) .     This is a 78 year old male in good health who presents with a 1 month history of a visible palpable bulge in his right groin.  The hernia remains reducible.  He denies any changes in his bowel habits.  The patient has had previous prostate surgery which was performed in 1998 by Dr. Kerby Pearson.     Review of Systems: A complete review of systems was obtained from the patient.  I have reviewed this information and discussed as appropriate with the patient.  See HPI as well for other ROS.   Review of Systems  Constitutional: Negative.   HENT: Negative.    Eyes: Negative.   Respiratory: Negative.    Cardiovascular: Negative.   Gastrointestinal: Negative.   Genitourinary: Negative.   Musculoskeletal: Negative.   Skin: Negative.   Neurological: Negative.   Endo/Heme/Allergies: Negative.   Psychiatric/Behavioral: Negative.          Medical History: Past Medical History      Past Medical History:  Diagnosis Date   Arthritis     Chronic kidney disease     GERD (gastroesophageal reflux disease)     History of cancer          Problem List     Patient Active Problem List  Diagnosis   Ascending aorta dilatation ()   Barrett's esophagus determined by endoscopy   Basal cell carcinoma   Bilateral shoulder bursitis   CKD (chronic kidney disease)   Deviated nasal septum   Diastolic dysfunction   Elevated prostate specific antigen (PSA)   Epistaxis   History of colonic polyps   Hyperlipidemia   Hypertrophy of nasal turbinates   Malignant neoplasm of prostate (CMS/HHS-HCC)   Personal history of malignant neoplasm of prostate   Right inguinal hernia   Rheumatoid factor positive   Sensorineural hearing loss, bilateral         Past Surgical History       Past Surgical History:  Procedure Laterality Date   PROSTATE SURGERY       SPINE SURGERY            Allergies  No Known Allergies     Medications Ordered Prior to Encounter        Current Outpatient Medications on File Prior to Visit  Medication Sig Dispense Refill   allopurinoL  (ZYLOPRIM ) 300 MG tablet Take 1 tablet by mouth once daily       ascorbic acid, vitamin C, (VITAMIN C) 100 MG tablet Take 100 mg by mouth once daily       famotidine  (PEPCID ) 40 MG tablet Take 1 tablet by mouth 2 (two) times daily       folic acid , bulk, 100 % Powd 1,000 mcg       ipratropium (ATROVENT) 0.06 % nasal spray Place 2 sprays into one nostril 2 (two) times daily       multivitamin tablet Take 1 tablet by mouth once daily       omeprazole  (PRILOSEC) 40 MG DR capsule Take 40 mg by mouth once daily       rosuvastatin  (CRESTOR ) 40 MG tablet Take 40 mg by mouth once daily  traMADoL  (ULTRAM ) 50 mg tablet Take 50 mg by mouth        No current facility-administered medications on file prior to visit.        Family History       Family History  Problem Relation Age of Onset   Diabetes Mother          Tobacco Use History  Social History        Tobacco Use  Smoking Status Former   Types: Cigarettes  Smokeless Tobacco Never        Social History  Social History         Socioeconomic History   Marital status: Single  Tobacco Use   Smoking status: Former      Types: Cigarettes   Smokeless tobacco: Never  Substance and Sexual Activity   Alcohol use: Not Currently   Drug use: Never    Social Drivers of Health        Financial Resource Strain: Low Risk  (04/21/2023)    Received from [facility]    Overall Financial Resource Strain (CARDIA)     Difficulty of Paying Living Expenses: Not hard at all  Food Insecurity: Low Risk  (05/11/2023)    Received from [facility]    Hunger Vital Sign     Worried About Running Out of Food in the  Last Year: Never true     Ran Out of Food in the Last Year: Never true  Transportation Needs: No Transportation Needs (05/11/2023)    Received from [employer]     In the past 12 months, has lack of reliable transportation kept you from medical appointments, meetings, work or from getting things needed for daily living? : No  Physical Activity: Sufficiently Active (04/21/2023)    Received from [facility]    Exercise Vital Sign     Days of Exercise per Week: 5 days     Minutes of Exercise per Session: 60 min  Stress: No Stress Concern Present (04/21/2023)    Received from [facility] - Occupational Stress Questionnaire     Feeling of Stress : Not at all  Social Connections: Moderately Integrated (04/21/2023)    Received from [facility]    Social Connection and Isolation Panel [NHANES]     Frequency of Communication with Friends and Family: More than three times a week     Frequency of Social Gatherings with Friends and Family: More than three times a week     Attends Religious Services: Never     Database administrator or Organizations: Yes     Attends Engineer, structural: More than 4 times per year     Marital Status: Married  Housing Stability: Unknown (06/07/2023)    Housing Stability Vital Sign     Homeless in the Last Year: No        Objective:         Vitals:    06/07/23 1345  BP: (!) 156/82  Pulse: 94  Temp: 37.2 C (99 F)  SpO2: 97%  Weight: 76.2 kg (168 lb)  Height: 180.3 cm (5\' 11" )    Body mass index is 23.43 kg/m.   Physical Exam    Constitutional:  WDWN in NAD, conversant, no obvious deformities; lying in bed comfortably Eyes:  Pupils equal, round; sclera anicteric; moist conjunctiva; no lid lag HENT:  Oral mucosa moist; good dentition  Neck:  No masses palpated, trachea midline; no thyromegaly Lungs:  CTA bilaterally; normal respiratory effort CV:  Regular rate and rhythm; no  murmurs; extremities well-perfused with no edema Abd:  +bowel sounds, soft, non-tender, no palpable organomegaly; no palpable hernias Visible palpable right inguinal hernia that is easily reducible.  No sign of left inguinal hernia. Bilateral descended testes, no testicular masses Musc: Normal gait; no apparent clubbing or cyanosis in extremities Lymphatic:  No palpable cervical or axillary lymphadenopathy Skin:  Warm, dry; no sign of jaundice Psychiatric - alert and oriented x 4; calm mood and affect     Assessment and Plan:  Diagnoses and all orders for this visit:   Right inguinal hernia       Recommend right inguinal hernia repair with mesh.The surgical procedure has been discussed with the patient.  Potential risks, benefits, alternative treatments, and expected outcomes have been explained.  All of the patient's questions at this time have been answered.  The likelihood of reaching the patient's treatment goal is good.  The patient understands the proposed surgical procedure and wishes to proceed.       Nayomi Tabron KAI Clarrissa Shimkus, MD  06/07/2023 4:11 PM

## 2023-06-07 NOTE — H&P (View-Only) (Signed)
 Subjective    Chief Complaint: New Consultation ( Eval of RIH,)       History of Present Illness: Tom Gaines is a 78 y.o. male who is seen today as an office consultation at the request of Dr. Donnette Gal for evaluation of New Consultation ( Eval of RIH,) .     This is a 78 year old male in good health who presents with a 1 month history of a visible palpable bulge in his right groin.  The hernia remains reducible.  He denies any changes in his bowel habits.  The patient has had previous prostate surgery which was performed in 1998 by Dr. Kerby Pearson.     Review of Systems: A complete review of systems was obtained from the patient.  I have reviewed this information and discussed as appropriate with the patient.  See HPI as well for other ROS.   Review of Systems  Constitutional: Negative.   HENT: Negative.    Eyes: Negative.   Respiratory: Negative.    Cardiovascular: Negative.   Gastrointestinal: Negative.   Genitourinary: Negative.   Musculoskeletal: Negative.   Skin: Negative.   Neurological: Negative.   Endo/Heme/Allergies: Negative.   Psychiatric/Behavioral: Negative.          Medical History: Past Medical History      Past Medical History:  Diagnosis Date   Arthritis     Chronic kidney disease     GERD (gastroesophageal reflux disease)     History of cancer          Problem List     Patient Active Problem List  Diagnosis   Ascending aorta dilatation ()   Barrett's esophagus determined by endoscopy   Basal cell carcinoma   Bilateral shoulder bursitis   CKD (chronic kidney disease)   Deviated nasal septum   Diastolic dysfunction   Elevated prostate specific antigen (PSA)   Epistaxis   History of colonic polyps   Hyperlipidemia   Hypertrophy of nasal turbinates   Malignant neoplasm of prostate (CMS/HHS-HCC)   Personal history of malignant neoplasm of prostate   Right inguinal hernia   Rheumatoid factor positive   Sensorineural hearing loss, bilateral         Past Surgical History       Past Surgical History:  Procedure Laterality Date   PROSTATE SURGERY       SPINE SURGERY            Allergies  No Known Allergies     Medications Ordered Prior to Encounter        Current Outpatient Medications on File Prior to Visit  Medication Sig Dispense Refill   allopurinoL  (ZYLOPRIM ) 300 MG tablet Take 1 tablet by mouth once daily       ascorbic acid, vitamin C, (VITAMIN C) 100 MG tablet Take 100 mg by mouth once daily       famotidine  (PEPCID ) 40 MG tablet Take 1 tablet by mouth 2 (two) times daily       folic acid , bulk, 100 % Powd 1,000 mcg       ipratropium (ATROVENT) 0.06 % nasal spray Place 2 sprays into one nostril 2 (two) times daily       multivitamin tablet Take 1 tablet by mouth once daily       omeprazole  (PRILOSEC) 40 MG DR capsule Take 40 mg by mouth once daily       rosuvastatin  (CRESTOR ) 40 MG tablet Take 40 mg by mouth once daily  traMADoL  (ULTRAM ) 50 mg tablet Take 50 mg by mouth        No current facility-administered medications on file prior to visit.        Family History       Family History  Problem Relation Age of Onset   Diabetes Mother          Tobacco Use History  Social History        Tobacco Use  Smoking Status Former   Types: Cigarettes  Smokeless Tobacco Never        Social History  Social History         Socioeconomic History   Marital status: Single  Tobacco Use   Smoking status: Former      Types: Cigarettes   Smokeless tobacco: Never  Substance and Sexual Activity   Alcohol use: Not Currently   Drug use: Never    Social Drivers of Health        Financial Resource Strain: Low Risk  (04/21/2023)    Received from [facility]    Overall Financial Resource Strain (CARDIA)     Difficulty of Paying Living Expenses: Not hard at all  Food Insecurity: Low Risk  (05/11/2023)    Received from [facility]    Hunger Vital Sign     Worried About Running Out of Food in the  Last Year: Never true     Ran Out of Food in the Last Year: Never true  Transportation Needs: No Transportation Needs (05/11/2023)    Received from [employer]     In the past 12 months, has lack of reliable transportation kept you from medical appointments, meetings, work or from getting things needed for daily living? : No  Physical Activity: Sufficiently Active (04/21/2023)    Received from [facility]    Exercise Vital Sign     Days of Exercise per Week: 5 days     Minutes of Exercise per Session: 60 min  Stress: No Stress Concern Present (04/21/2023)    Received from [facility] - Occupational Stress Questionnaire     Feeling of Stress : Not at all  Social Connections: Moderately Integrated (04/21/2023)    Received from [facility]    Social Connection and Isolation Panel [NHANES]     Frequency of Communication with Friends and Family: More than three times a week     Frequency of Social Gatherings with Friends and Family: More than three times a week     Attends Religious Services: Never     Database administrator or Organizations: Yes     Attends Engineer, structural: More than 4 times per year     Marital Status: Married  Housing Stability: Unknown (06/07/2023)    Housing Stability Vital Sign     Homeless in the Last Year: No        Objective:         Vitals:    06/07/23 1345  BP: (!) 156/82  Pulse: 94  Temp: 37.2 C (99 F)  SpO2: 97%  Weight: 76.2 kg (168 lb)  Height: 180.3 cm (5\' 11" )    Body mass index is 23.43 kg/m.   Physical Exam    Constitutional:  WDWN in NAD, conversant, no obvious deformities; lying in bed comfortably Eyes:  Pupils equal, round; sclera anicteric; moist conjunctiva; no lid lag HENT:  Oral mucosa moist; good dentition  Neck:  No masses palpated, trachea midline; no thyromegaly Lungs:  CTA bilaterally; normal respiratory effort CV:  Regular rate and rhythm; no  murmurs; extremities well-perfused with no edema Abd:  +bowel sounds, soft, non-tender, no palpable organomegaly; no palpable hernias Visible palpable right inguinal hernia that is easily reducible.  No sign of left inguinal hernia. Bilateral descended testes, no testicular masses Musc: Normal gait; no apparent clubbing or cyanosis in extremities Lymphatic:  No palpable cervical or axillary lymphadenopathy Skin:  Warm, dry; no sign of jaundice Psychiatric - alert and oriented x 4; calm mood and affect     Assessment and Plan:  Diagnoses and all orders for this visit:   Right inguinal hernia       Recommend right inguinal hernia repair with mesh.The surgical procedure has been discussed with the patient.  Potential risks, benefits, alternative treatments, and expected outcomes have been explained.  All of the patient's questions at this time have been answered.  The likelihood of reaching the patient's treatment goal is good.  The patient understands the proposed surgical procedure and wishes to proceed.       Nayomi Tabron KAI Clarrissa Shimkus, MD  06/07/2023 4:11 PM

## 2023-06-08 ENCOUNTER — Telehealth: Payer: Self-pay | Admitting: *Deleted

## 2023-06-08 NOTE — Telephone Encounter (Signed)
 Pt has been scheduled tele preop appt 06/13/23. Pt says surgeon will not schedule until cleared. Med rec and consent are done.

## 2023-06-08 NOTE — Telephone Encounter (Signed)
   Name: Tom Gaines  DOB: 1945-03-04  MRN: [id number]  Primary Cardiologist: Alexandria Angel, MD   Preoperative team, please contact this patient and set up a phone call appointment for further preoperative risk assessment. Please obtain consent and complete medication review. Thank you for your help.  I confirm that guidance regarding antiplatelet and oral anticoagulation therapy has been completed and, if necessary, noted below.  Ideally aspirin should be continued without interruption, however if the bleeding risk is too great, aspirin may be held for 5-7 days prior to surgery. Please resume aspirin post operatively when it is felt to be safe from a bleeding standpoint.    I also confirmed the patient resides in the state of [place] . As per [facility] Medical [employer] telemedicine laws, the patient must reside in the state in which the provider is licensed.   Morey Ar, NP 06/08/2023, 4:17 PM [facility]

## 2023-06-08 NOTE — Telephone Encounter (Signed)
 Patient is returning call.

## 2023-06-08 NOTE — Telephone Encounter (Signed)
 Called and left message to call our office and ask for the preop team to schedule TELE preop appt.

## 2023-06-08 NOTE — Telephone Encounter (Signed)
 Pt has been scheduled tele preop appt 06/13/23. Pt says surgeon will not schedule until cleared. Med rec and consent are done.     Patient Consent for Virtual Visit        Tom Gaines has provided verbal consent on 06/08/2023 for a virtual visit (video or telephone).   CONSENT FOR VIRTUAL VISIT FOR:  Tom Gaines  By participating in this virtual visit I agree to the following:  I hereby voluntarily request, consent and authorize [facility] and its employed or contracted physicians, physician assistants, nurse practitioners or other licensed health care professionals (the Practitioner), to provide me with telemedicine health care services (the "Services") as deemed necessary by the treating Practitioner. I acknowledge and consent to receive the Services by the Practitioner via telemedicine. I understand that the telemedicine visit will involve communicating with the Practitioner through live audiovisual communication technology and the disclosure of certain medical information by electronic transmission. I acknowledge that I have been given the opportunity to request an in-person assessment or other available alternative prior to the telemedicine visit and am voluntarily participating in the telemedicine visit.  I understand that I have the right to withhold or withdraw my consent to the use of telemedicine in the course of my care at any time, without affecting my right to future care or treatment, and that the Practitioner or I may terminate the telemedicine visit at any time. I understand that I have the right to inspect all information obtained and/or recorded in the course of the telemedicine visit and may receive copies of available information for a reasonable fee.  I understand that some of the potential risks of receiving the Services via telemedicine include:  Delay or interruption in medical evaluation due to technological equipment failure or disruption; Information  transmitted may not be sufficient (e.g. poor resolution of images) to allow for appropriate medical decision making by the Practitioner; and/or  In rare instances, security protocols could fail, causing a breach of personal health information.  Furthermore, I acknowledge that it is my responsibility to provide information about my medical history, conditions and care that is complete and accurate to the best of my ability. I acknowledge that Practitioner's advice, recommendations, and/or decision may be based on factors not within their control, such as incomplete or inaccurate data provided by me or distortions of diagnostic images or specimens that may result from electronic transmissions. I understand that the practice of medicine is not an exact science and that Practitioner makes no warranties or guarantees regarding treatment outcomes. I acknowledge that a copy of this consent can be made available to me via my patient portal [facility] MyChart), or I can request a printed copy by calling the office of [facility].    I understand that my insurance will be billed for this visit.   I have read or had this consent read to me. I understand the contents of this consent, which adequately explains the benefits and risks of the Services being provided via telemedicine.  I have been provided ample opportunity to ask questions regarding this consent and the Services and have had my questions answered to my satisfaction. I give my informed consent for the services to be provided through the use of telemedicine in my medical care

## 2023-06-13 ENCOUNTER — Ambulatory Visit: Attending: Internal Medicine | Admitting: Student

## 2023-06-13 DIAGNOSIS — Z01810 Encounter for preprocedural cardiovascular examination: Secondary | ICD-10-CM

## 2023-06-13 NOTE — Progress Notes (Signed)
 Virtual Visit via Telephone Note   Because of Tom Gaines's co-morbid illnesses, he is at least at moderate risk for complications without adequate follow up.  This format is felt to be most appropriate for this patient at this time.  The patient did not have access to video technology/had technical difficulties with video requiring transitioning to audio format only (telephone).  All issues noted in this document were discussed and addressed.  No physical exam could be performed with this format.  Please refer to the patient's chart for his consent to telehealth for [facility].  Evaluation Performed:  Preoperative cardiovascular risk assessment _____________   Date:  06/13/2023   Patient ID:  Tom Gaines, DOB 05-27-1945, MRN [id number] Patient Location:  Home Provider location:   Office  Primary Care Provider:  Colene Dauphin, MD Primary Cardiologist:  Alexandria Angel, MD  Chief Complaint / Patient Profile   78 y.o. y/o male with a h/o nonobstructive CAD, PVCs, ascending aortic dilatation, hyperlipidemia, CKD, prostate cancer, gout who is pending hernia repair by Dr. Eli Grizzle and presents today for telephonic preoperative cardiovascular risk assessment.  History of Present Illness    Tom Gaines is a 78 y.o. male who presents via audio/video conferencing for a telehealth visit today.  Pt was last seen in cardiology clinic on 12/01/2022 by Dr. Audery Blazing.  At that time Tom Gaines was stable from a cardiac standpoint.  The patient is now pending procedure as outlined above. Since his last visit, he is doing well. Patient denies shortness of breath, dyspnea on exertion, lower extremity edema, orthopnea or PND. No chest pain, pressure, or tightness. No palpitations.  He is very active going to the [employer] 5 days a week to perform cardio and yoga. He is able to do the stairmaster for 45 minutes each day. He has been holding off on weight training since the hernia was discovered  but that is part of his usual routine as well.   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 LDL goal equal less than 160, ideally less than 130. No FH MI or CVA   Low back pain 03/13/2014   Chronic, has seen Dr Felipe Horton and Dr [place] Taking tramadol  as needed   Nonallopathic lesion of lumbosacral region 12/01/2015   Nonallopathic lesion of sacral region 12/01/2015   Nonallopathic lesion of thoracic region 12/01/2015   Prediabetes 03/29/2016   Mom, sister DM   PROSTATE CANCER, HX OF 03/12/2008   Qualifier: Diagnosis of  By: Donnice Gale MD, [facility]   Radical prostatectomy 1998, Urologist Dr. Kerby Pearson    Past Surgical History:  Procedure Laterality  Date   CATARACT EXTRACTION, BILATERAL  2017   colonoscopy with polypectomy  2014   Dr Sandrea Cruel   LUMBAR [facility] SURGERY  2002   Dr Ellery Guthrie   PROSTATECTOMY  1998   SHOULDER ARTHROSCOPY     right; 2011; left 1990   SIGMOIDOSCOPY     TONSILLECTOMY AND ADENOIDECTOMY      Allergies  No Known Allergies  Home Medications    Prior to Admission medications   Medication Sig Start Date End Date Taking? Authorizing Provider  acyclovir (ZOVIRAX) 800 MG tablet Take 400 mg by mouth 2 (two) times daily. 09/04/20   [provider]  allopurinol  (ZYLOPRIM ) 300 MG tablet Take 1 tablet (300 mg total) by mouth daily. 04/19/23   Colene Dauphin, MD  Alpha-Lipoic Acid 600 MG CAPS Take by mouth.    [provider]  Ascorbic Acid (VITAMIN C) 100 MG tablet Take 100 mg by mouth daily.    [provider]  ASHWAGANDHA PO Take 300 mg by mouth 2 (two) times daily.    [provider]  ASPIRIN 81 PO Take 1 tablet by mouth daily.    [provider]  Azelastine  HCl 137 MCG/SPRAY SOLN PLACE 1-2 SPRAYS INTO BOTH NOSTRILS 2 (TWO) TIMES DAILY AS NEEDED. USE IN EACH NOSTRIL AS DIRECTED 05/26/23   Colene Dauphin, MD  B Complex Vitamins (VITAMIN B COMPLEX PO)     [provider]  Cholecalciferol (VITAMIN D ) 2000 units CAPS Take 1 capsule by mouth daily.    [provider]  co-enzyme Q-10 30 MG capsule Take 100 mg by mouth daily.    [provider]  famotidine  (PEPCID ) 40 MG tablet Take 1 tablet (40 mg total) by mouth 2 (two) times daily. 11/02/22   Asencion Blacksmith, MD  FOLIC ACID  PO 1,000 mcg.    [provider]  ipratropium (ATROVENT) 0.06 % nasal spray Place 2 sprays into both nostrils 2 (two) times daily. 02/25/22   [provider]  L-CITRULLINE PO Take by mouth.    [provider]  LYSINE PO Take 1,000 mg by mouth.    [provider]  Multiple Vitamin (MULTIVITAMIN) tablet Take 1 tablet by mouth daily.    [provider]  Omega-3 Fatty Acids (FISH OIL) 1000 MG CAPS Take 1,000 mg by mouth. 1 by mouth daily    [provider]  omeprazole  (PRILOSEC) 40 MG capsule Take 1 capsule (40 mg total) by mouth daily. 12/01/22   Asencion Blacksmith, MD  Pyridoxine HCl (VITAMIN B6) 100 MG TABS     [provider]  RESVERATROL PO Take by mouth. Patient takes bid    [provider]  rosuvastatin  (CRESTOR ) 40 MG tablet Take 1 tablet (40 mg total) by mouth daily. 12/26/22   Lenise Quince, MD  traMADol  (ULTRAM ) 50 MG  tablet Take 1 tablet (50 mg total) by mouth daily. For chronic back pain 03/14/22   Colene Dauphin, MD  Turmeric Curcumin 500 MG CAPS Take 500 mg by mouth 3 (three) times daily.     [provider]    Physical Exam    Vital Signs:  Tom Gaines does not have vital signs available for review today.  Given telephonic nature of communication, physical exam is limited. AAOx3. NAD. Normal affect.  Speech and respirations are unlabored.   Assessment & Plan    Primary Cardiologist: Alexandria Angel, MD  Preoperative cardiovascular risk assessment.  Hernia repair by Dr.  Tsuei.  Chart reviewed as part of pre-operative protocol coverage. According to the RCRI, patient has a 0.4% risk of MACE. Patient reports activity equivalent to >4.0 METS (performs cardio, weight training and yoga at the [facility] 5 days a week).   Given past medical history and time since last visit, based on ACC/AHA guidelines, Tom Gaines would be at acceptable risk for the planned procedure without further cardiovascular testing.   Patient was advised that if he develops new symptoms prior to surgery to contact our office to arrange a follow-up appointment.  he verbalized understanding.  Ideally aspirin should be continued without interruption, however if the bleeding risk is too great, aspirin may be held for 5-7 days prior to surgery. Please resume aspirin post operatively when it is felt to be safe from a bleeding standpoint.     I will route this recommendation to the requesting party via Epic fax function.  Please call with questions.  Time:   Today, I have spent 5 minutes with the patient with telehealth technology discussing medical history, symptoms, and management plan.     Morey Ar, NP  06/13/2023, 7:51 AM

## 2023-06-20 ENCOUNTER — Encounter: Payer: Self-pay | Admitting: Cardiology

## 2023-06-20 ENCOUNTER — Encounter: Payer: Self-pay | Admitting: Internal Medicine

## 2023-06-20 NOTE — Pre-Procedure Instructions (Signed)
 Surgical Instructions   Your procedure is scheduled on Thursday, Jun 29, 2023. Report to [facility] Main Entrance "A" at 9:00 A.M., then check in with the Admitting office. Any questions or running late day of surgery: call [phone]  Questions prior to your surgery date: call [phone], Monday-Friday, 8am-4pm. If you experience any cold or flu symptoms such as cough, fever, chills, shortness of breath, etc. between now and your scheduled surgery, please notify us  at the above number.     Remember:  Do not eat after midnight the night before your surgery   You may drink clear liquids until 8:00am the morning of your surgery.   Clear liquids allowed are: Water, Non-Citrus Juices (without pulp), Carbonated Beverages, Clear Tea (no milk, honey, etc.), Black Coffee Only (NO MILK, CREAM OR POWDERED CREAMER of any kind), and Gatorade.    Take these medicines the morning of surgery with A SIP OF WATER : Acyclovir (Zovirax) Allopurinol  (Zyloprim ) Famotidine  (Pepcid ) Rosuvastatin  (Crestor ) Tramadol  (Ultram )  May take these medicines IF NEEDED: Azelastine  HCL nasal spray Ipratropium (Atrovent)  Per your Doctor, STOP your Aspirin 5-7 days prior to surgery.  One week prior to surgery, STOP taking any Aleve, Naproxen, Ibuprofen, Motrin, Advil, Goody's, BC's, all herbal medications, fish oil, and non-prescription vitamins.                    Do NOT Smoke (Tobacco/Vaping) for 24 hours prior to your procedure.  If you use a CPAP at night, you may bring your mask/headgear for your overnight stay.   You will be asked to remove any contacts, glasses, piercing's, hearing aid's, dentures/partials prior to surgery. Please bring cases for these items if needed.    Patients discharged the day of surgery will not be allowed to drive home, and someone needs to stay with them for 24 hours.  SURGICAL WAITING ROOM VISITATION Patients may have no more than 2 support people in the waiting area -  these visitors may rotate.   Pre-op nurse will coordinate an appropriate time for 1 ADULT support person, who may not rotate, to accompany patient in pre-op.  Children under the age of 46 must have an adult with them who is not the patient and must remain in the main waiting area with an adult.  If the patient needs to stay at the hospital during part of their recovery, the visitor guidelines for inpatient rooms apply.  Please refer to the [facility] website for the visitor guidelines for any additional information.   If you received a COVID test during your pre-op visit  it is requested that you wear a mask when out in public, stay away from anyone that may not be feeling well and notify your surgeon if you develop symptoms. If you have been in contact with anyone that has tested positive in the last 10 days please notify you surgeon.      Pre-operative CHG Bathing Instructions   You can play a key role in reducing the risk of infection after surgery. Your skin needs to be as free of germs as possible. You can reduce the number of germs on your skin by washing with CHG (chlorhexidine gluconate) soap before surgery. CHG is an antiseptic soap that kills germs and continues to kill germs even after washing.   DO NOT use if you have an allergy to chlorhexidine/CHG or antibacterial soaps. If your skin becomes reddened or irritated, stop using the CHG and notify one of our RNs at  [phone].              TAKE A SHOWER THE NIGHT BEFORE SURGERY AND THE DAY OF SURGERY    Please keep in mind the following:  DO NOT shave, including legs and underarms, 48 hours prior to surgery.   You may shave your face before/day of surgery.  Place clean sheets on your bed the night before surgery Use a clean washcloth (not used since being washed) for each shower. DO NOT sleep with pet's night before surgery.  CHG Shower Instructions:  Wash your face and private area with normal soap. If you choose to wash  your hair, wash first with your normal shampoo.  After you use shampoo/soap, rinse your hair and body thoroughly to remove shampoo/soap residue.  Turn the water OFF and apply half the bottle of CHG soap to a CLEAN washcloth.  Apply CHG soap ONLY FROM YOUR NECK DOWN TO YOUR TOES (washing for 3-5 minutes)  DO NOT use CHG soap on face, private areas, open wounds, or sores.  Pay special attention to the area where your surgery is being performed.  If you are having back surgery, having someone wash your back for you may be helpful. Wait 2 minutes after CHG soap is applied, then you may rinse off the CHG soap.  Pat dry with a clean towel  Put on clean pajamas    Additional instructions for the day of surgery: DO NOT APPLY any lotions, deodorants, cologne, or perfumes.   Do not wear jewelry or makeup Do not wear nail polish, gel polish, artificial nails, or any other type of covering on natural nails (fingers and toes) Do not bring valuables to the hospital. [facility] is not responsible for valuables/personal belongings. Put on clean/comfortable clothes.  Please brush your teeth.  Ask your nurse before applying any prescription medications to the skin.

## 2023-06-21 ENCOUNTER — Encounter (HOSPITAL_COMMUNITY): Payer: Self-pay

## 2023-06-21 ENCOUNTER — Other Ambulatory Visit: Payer: Self-pay

## 2023-06-21 ENCOUNTER — Encounter (HOSPITAL_COMMUNITY)
Admission: RE | Admit: 2023-06-21 | Discharge: 2023-06-21 | Disposition: A | Discharge status: Home / Self Care | Source: Ambulatory Visit | Attending: Surgery | Admitting: Surgery

## 2023-06-21 VITALS — BP 127/80 | HR 57 | Temp 98.4°F | Resp 18 | Ht 71.0 in | Wt 169.8 lb

## 2023-06-21 DIAGNOSIS — K219 Gastro-esophageal reflux disease without esophagitis: Secondary | ICD-10-CM | POA: Insufficient documentation

## 2023-06-21 DIAGNOSIS — I493 Ventricular premature depolarization: Secondary | ICD-10-CM | POA: Insufficient documentation

## 2023-06-21 DIAGNOSIS — Z01812 Encounter for preprocedural laboratory examination: Secondary | ICD-10-CM | POA: Insufficient documentation

## 2023-06-21 DIAGNOSIS — Z01818 Encounter for other preprocedural examination: Secondary | ICD-10-CM | POA: Diagnosis present

## 2023-06-21 DIAGNOSIS — E785 Hyperlipidemia, unspecified: Secondary | ICD-10-CM | POA: Diagnosis not present

## 2023-06-21 DIAGNOSIS — I7121 Aneurysm of the ascending aorta, without rupture: Secondary | ICD-10-CM | POA: Diagnosis not present

## 2023-06-21 DIAGNOSIS — I2510 Atherosclerotic heart disease of native coronary artery without angina pectoris: Secondary | ICD-10-CM | POA: Diagnosis not present

## 2023-06-21 DIAGNOSIS — Z79899 Other long term (current) drug therapy: Secondary | ICD-10-CM | POA: Diagnosis not present

## 2023-06-21 HISTORY — DX: Barrett's esophagus without dysplasia: K22.70

## 2023-06-21 HISTORY — DX: Prediabetes: R73.03

## 2023-06-21 LAB — CBC
HCT: 44.5 % (ref 39.0–52.0)
Hemoglobin: 14.4 g/dL (ref 13.0–17.0)
MCH: 32.1 pg (ref 26.0–34.0)
MCHC: 32.4 g/dL (ref 30.0–36.0)
MCV: 99.3 fL (ref 80.0–100.0)
Platelets: 180 10*3/uL (ref 150–400)
RBC: 4.48 MIL/uL (ref 4.22–5.81)
RDW: 15.5 % (ref 11.5–15.5)
WBC: 6.6 10*3/uL (ref 4.0–10.5)
nRBC: 0 % (ref 0.0–0.2)

## 2023-06-21 LAB — BASIC METABOLIC PANEL WITH GFR
Anion gap: 8 (ref 5–15)
BUN: 15 mg/dL (ref 8–23)
CO2: 29 mmol/L (ref 22–32)
Calcium: 9 mg/dL (ref 8.9–10.3)
Chloride: 104 mmol/L (ref 98–111)
Creatinine, Ser: 1.2 mg/dL (ref 0.61–1.24)
GFR, Estimated: >60 mL/min (ref >60)
Glucose, Bld: 87 mg/dL (ref 70–99)
Potassium: 4.6 mmol/L (ref 3.5–5.1)
Sodium: 141 mmol/L (ref 135–145)

## 2023-06-22 NOTE — Progress Notes (Signed)
 Anesthesia Chart Review:  78 year old male follows with cardiology for history of nonoperative CAD, PVCs, ascending aortic dilation, HLD.  Echo 12/2020 showed EF 60 to 65%, grade 1 DD, no significant valvular abnormalities.  Seen by Morey Ar, NP on 06/13/2023 for preop evaluation.  Per note, "Chart reviewed as part of pre-operative protocol coverage. According to the RCRI, patient has a 0.4% risk of MACE. Patient reports activity equivalent to >4.0 METS (performs cardio, weight training and yoga at the [facility] 5 days a week). Given past medical history and time since last visit, based on ACC/AHA guidelines, Tom Gaines would be at acceptable risk for the planned procedure without further cardiovascular testing. Patient was advised that if he develops new symptoms prior to surgery to contact our office to arrange a follow-up appointment.  he verbalized understanding. Ideally aspirin should be continued without interruption, however if the bleeding risk is too great, aspirin may be held for 5-7 days prior to surgery. Please resume aspirin post operatively when it is felt to be safe from a bleeding standpoint."  Other pertinent history includes GERD on H2 blocker and PPI.  Preop labs reviewed, WNL.   EKG 12/01/2022: Sinus rhythm with occasional Premature ventricular complexes.  Rate 69.  TTE 12/21/2020: 1. Left ventricular ejection fraction, by estimation, is 60 to 65%. The  left ventricle has normal function. The left ventricle has no regional  wall motion abnormalities. Left ventricular diastolic parameters are  consistent with Grade I diastolic  dysfunction (impaired relaxation). The average left ventricular global  longitudinal strain is -22.4 %. The global longitudinal strain is normal.   2. Right ventricular systolic function is normal. The right ventricular  size is normal.   3. The mitral valve is abnormal. Trivial mitral valve regurgitation. No  evidence of mitral stenosis.   4.  The aortic valve is tricuspid. There is mild calcification of the  aortic valve. Aortic valve regurgitation is not visualized. Aortic valve  sclerosis is present, with no evidence of aortic valve stenosis.   5. Aortic dilatation noted. There is mild dilatation of the ascending  aorta, measuring 41 mm.   6. The inferior vena cava is normal in size with greater than 50%  respiratory variability, suggesting right atrial pressure of 3 mmHg.      [facility]/Anesthesiology Phone [phone] 06/22/2023 11:47 AM

## 2023-06-22 NOTE — Anesthesia Preprocedure Evaluation (Signed)
 Anesthesia Evaluation  Patient identified by MRN, date of birth, ID band Patient awake    Reviewed: Allergy & Precautions, NPO status , Patient's Chart, lab work & pertinent test results  Airway Mallampati: II  TM Distance: >3 FB     Dental no notable dental hx. (+) Caps, Teeth Intact, Dental Advisory Given   Pulmonary former smoker   Pulmonary exam normal breath sounds clear to auscultation       Cardiovascular Normal cardiovascular exam Rhythm:Regular Rate:Normal  Ascending aortic aneurysm 4.0   Neuro/Psych negative neurological ROS  negative psych ROS   GI/Hepatic Neg liver ROS,GERD  Medicated,,  Endo/Other  negative endocrine ROS    Renal/GU Renal InsufficiencyRenal disease   Prostate Ca negative genitourinary   Musculoskeletal  (+) Arthritis , Osteoarthritis,  Right inguinal hernia   Abdominal   Peds  Hematology negative hematology ROS (+)   Anesthesia Other Findings   Reproductive/Obstetrics                              Anesthesia Physical Anesthesia Plan  ASA: 2  Anesthesia Plan: General   Post-op Pain Management: Minimal or no pain anticipated and Regional block*   Induction: Intravenous  PONV Risk Score and Plan: 3 and Treatment may vary due to age or medical condition and Ondansetron  Airway Management Planned: LMA  Additional Equipment: None  Intra-op Plan:   Post-operative Plan:   Informed Consent: I have reviewed the patients History and Physical, chart, labs and discussed the procedure including the risks, benefits and alternatives for the proposed anesthesia with the patient or authorized representative who has indicated his/her understanding and acceptance.     Dental advisory given  Plan Discussed with: Anesthesiologist and CRNA  Anesthesia Plan Comments: (PAT note by Tom Costain, PA-C: 78 year old male follows with cardiology for history of  nonoperative CAD, PVCs, ascending aortic dilation, HLD.  Echo 12/2020 showed EF 60 to 65%, grade 1 DD, no significant valvular abnormalities.  Seen by Tom Ar, NP on 06/13/2023 for preop evaluation.  Per note, "Chart reviewed as part of pre-operative protocol coverage. According to the RCRI, patient has a 0.4% risk of MACE. Patient reports activity equivalent to >4.0 METS (performs cardio, weight training and yoga at the [facility] 5 days a week). Given past medical history and time since last visit, based on ACC/AHA guidelines, Tom Gaines would be at acceptable risk for the planned procedure without further cardiovascular testing. Patient was advised that if he develops new symptoms prior to surgery to contact our office to arrange a follow-up appointment.  he verbalized understanding. Ideally aspirin should be continued without interruption, however if the bleeding risk is too great, aspirin may be held for 5-7 days prior to surgery. Please resume aspirin post operatively when it is felt to be safe from a bleeding standpoint."  Other pertinent history includes GERD on H2 blocker and PPI.  Preop labs reviewed, WNL.   EKG 12/01/2022: Sinus rhythm with occasional Premature ventricular complexes.  Rate 69.  TTE 12/21/2020: 1. Left ventricular ejection fraction, by estimation, is 60 to 65%. The  left ventricle has normal function. The left ventricle has no regional  wall motion abnormalities. Left ventricular diastolic parameters are  consistent with Grade I diastolic  dysfunction (impaired relaxation). The average left ventricular global  longitudinal strain is -22.4 %. The global longitudinal strain is normal.   2. Right ventricular systolic function is normal. The right ventricular  size is normal.   3. The mitral valve is abnormal. Trivial mitral valve regurgitation. No  evidence of mitral stenosis.   4. The aortic valve is tricuspid. There is mild calcification of the  aortic valve.  Aortic valve regurgitation is not visualized. Aortic valve  sclerosis is present, with no evidence of aortic valve stenosis.   5. Aortic dilatation noted. There is mild dilatation of the ascending  aorta, measuring 41 mm.   6. The inferior vena cava is normal in size with greater than 50%  respiratory variability, suggesting right atrial pressure of 3 mmHg.    )         Anesthesia Quick Evaluation

## 2023-06-28 DIAGNOSIS — Z85828 Personal history of other malignant neoplasm of skin: Secondary | ICD-10-CM | POA: Diagnosis not present

## 2023-06-28 DIAGNOSIS — L820 Inflamed seborrheic keratosis: Secondary | ICD-10-CM | POA: Diagnosis not present

## 2023-06-28 DIAGNOSIS — L570 Actinic keratosis: Secondary | ICD-10-CM | POA: Diagnosis not present

## 2023-06-28 DIAGNOSIS — L821 Other seborrheic keratosis: Secondary | ICD-10-CM | POA: Diagnosis not present

## 2023-06-28 DIAGNOSIS — L111 Transient acantholytic dermatosis [Grover]: Secondary | ICD-10-CM | POA: Diagnosis not present

## 2023-06-28 DIAGNOSIS — D2262 Melanocytic nevi of left upper limb, including shoulder: Secondary | ICD-10-CM | POA: Diagnosis not present

## 2023-06-28 DIAGNOSIS — D2362 Other benign neoplasm of skin of left upper limb, including shoulder: Secondary | ICD-10-CM | POA: Diagnosis not present

## 2023-06-29 ENCOUNTER — Ambulatory Visit (HOSPITAL_COMMUNITY): Payer: Self-pay | Admitting: Physician Assistant

## 2023-06-29 ENCOUNTER — Other Ambulatory Visit (HOSPITAL_COMMUNITY): Payer: Self-pay

## 2023-06-29 ENCOUNTER — Other Ambulatory Visit: Payer: Self-pay

## 2023-06-29 ENCOUNTER — Ambulatory Visit (HOSPITAL_COMMUNITY)
Admission: RE | Admit: 2023-06-29 | Discharge: 2023-06-29 | Disposition: A | Discharge status: Home / Self Care | Attending: Surgery | Admitting: Surgery

## 2023-06-29 ENCOUNTER — Encounter (HOSPITAL_COMMUNITY): Payer: Self-pay | Admitting: Surgery

## 2023-06-29 ENCOUNTER — Encounter (HOSPITAL_COMMUNITY)
Admission: RE | Disposition: A | Discharge status: Home / Self Care | Payer: Self-pay | Source: Home / Self Care | Attending: Surgery

## 2023-06-29 ENCOUNTER — Ambulatory Visit (HOSPITAL_COMMUNITY): Payer: Self-pay | Admitting: Anesthesiology

## 2023-06-29 DIAGNOSIS — Z79899 Other long term (current) drug therapy: Secondary | ICD-10-CM | POA: Diagnosis not present

## 2023-06-29 DIAGNOSIS — G8918 Other acute postprocedural pain: Secondary | ICD-10-CM | POA: Diagnosis not present

## 2023-06-29 DIAGNOSIS — K4090 Unilateral inguinal hernia, without obstruction or gangrene, not specified as recurrent: Secondary | ICD-10-CM | POA: Diagnosis not present

## 2023-06-29 DIAGNOSIS — N189 Chronic kidney disease, unspecified: Secondary | ICD-10-CM | POA: Diagnosis not present

## 2023-06-29 DIAGNOSIS — Z8546 Personal history of malignant neoplasm of prostate: Secondary | ICD-10-CM | POA: Diagnosis not present

## 2023-06-29 DIAGNOSIS — M1990 Unspecified osteoarthritis, unspecified site: Secondary | ICD-10-CM | POA: Diagnosis not present

## 2023-06-29 DIAGNOSIS — K219 Gastro-esophageal reflux disease without esophagitis: Secondary | ICD-10-CM | POA: Diagnosis not present

## 2023-06-29 DIAGNOSIS — Z87891 Personal history of nicotine dependence: Secondary | ICD-10-CM | POA: Insufficient documentation

## 2023-06-29 HISTORY — PX: INGUINAL HERNIA REPAIR: SHX194

## 2023-06-29 SURGERY — REPAIR, HERNIA, INGUINAL, ADULT
Anesthesia: General | Laterality: Right

## 2023-06-29 MED ORDER — ROCURONIUM BROMIDE 10 MG/ML (PF) SYRINGE
PREFILLED_SYRINGE | INTRAVENOUS | Status: AC
  Filled 2023-06-29: qty 10

## 2023-06-29 MED ORDER — FENTANYL CITRATE (PF) 250 MCG/5ML IJ SOLN
INTRAMUSCULAR | Status: AC
Start: 2023-06-29 — End: ?
  Filled 2023-06-29: qty 5

## 2023-06-29 MED ORDER — FENTANYL CITRATE (PF) 100 MCG/2ML IJ SOLN: 25.0000 ug | INTRAMUSCULAR | Status: DC | PRN

## 2023-06-29 MED ORDER — LIDOCAINE 2% (20 MG/ML) 5 ML SYRINGE
INTRAMUSCULAR | Status: DC | PRN
  Administered 2023-06-29: 60 mg via INTRAVENOUS

## 2023-06-29 MED ORDER — CEFAZOLIN SODIUM-DEXTROSE 2-4 GM/100ML-% IV SOLN
2.0000 g | INTRAVENOUS | Status: AC
  Administered 2023-06-29: 2 g via INTRAVENOUS

## 2023-06-29 MED ORDER — BUPIVACAINE LIPOSOME 1.3 % IJ SUSP
INTRAMUSCULAR | Status: DC | PRN
Start: 2023-06-29 — End: 2023-06-29
  Administered 2023-06-29: 10 mL via PERINEURAL

## 2023-06-29 MED ORDER — OXYCODONE HCL 5 MG PO TABS
5.0000 mg | ORAL_TABLET | Freq: Four times a day (QID) | ORAL | 0 refills | Status: DC | PRN
  Filled 2023-06-29: qty 15, 4d supply, fill #0

## 2023-06-29 MED ORDER — ONDANSETRON HCL 4 MG/2ML IJ SOLN: 4.0000 mg | Freq: Once | INTRAMUSCULAR | Status: DC | PRN

## 2023-06-29 MED ORDER — CHLORHEXIDINE GLUCONATE CLOTH 2 % EX PADS: 6.0000 | MEDICATED_PAD | Freq: Once | CUTANEOUS | Status: DC

## 2023-06-29 MED ORDER — 0.9 % SODIUM CHLORIDE (POUR BTL) OPTIME
TOPICAL | Status: DC | PRN
  Administered 2023-06-29: 1000 mL

## 2023-06-29 MED ORDER — LACTATED RINGERS IV SOLN
INTRAVENOUS | Status: DC | PRN
Start: 2023-06-29 — End: 2023-06-29

## 2023-06-29 MED ORDER — OXYCODONE HCL 5 MG PO TABS
ORAL_TABLET | ORAL | Status: AC
  Filled 2023-06-29: qty 1

## 2023-06-29 MED ORDER — CHLORHEXIDINE GLUCONATE 0.12 % MT SOLN
OROMUCOSAL | Status: AC
Start: 2023-06-29 — End: 2023-06-29
  Administered 2023-06-29: 15 mL via OROMUCOSAL
  Filled 2023-06-29: qty 15

## 2023-06-29 MED ORDER — FENTANYL CITRATE (PF) 100 MCG/2ML IJ SOLN
INTRAMUSCULAR | Status: AC
  Administered 2023-06-29: 50 ug via INTRAVENOUS
  Filled 2023-06-29: qty 2

## 2023-06-29 MED ORDER — MIDAZOLAM HCL 2 MG/2ML IJ SOLN
INTRAMUSCULAR | Status: AC
  Administered 2023-06-29: 1 mg via INTRAVENOUS
  Filled 2023-06-29: qty 2

## 2023-06-29 MED ORDER — FENTANYL CITRATE (PF) 100 MCG/2ML IJ SOLN: 50.0000 ug | Freq: Once | INTRAMUSCULAR | Status: AC

## 2023-06-29 MED ORDER — EPHEDRINE 5 MG/ML INJ
INTRAVENOUS | Status: AC
  Filled 2023-06-29: qty 5

## 2023-06-29 MED ORDER — BUPIVACAINE HCL (PF) 0.5 % IJ SOLN
INTRAMUSCULAR | Status: DC | PRN
  Administered 2023-06-29: 20 mL via PERINEURAL

## 2023-06-29 MED ORDER — BUPIVACAINE-EPINEPHRINE 0.25% -1:200000 IJ SOLN
INTRAMUSCULAR | Status: DC | PRN
  Administered 2023-06-29: 10.5 mL

## 2023-06-29 MED ORDER — CEFAZOLIN SODIUM-DEXTROSE 2-4 GM/100ML-% IV SOLN
INTRAVENOUS | Status: AC
Start: 2023-06-29 — End: 2023-06-29
  Filled 2023-06-29: qty 100

## 2023-06-29 MED ORDER — ORAL CARE MOUTH RINSE: 15.0000 mL | Freq: Once | OROMUCOSAL | Status: AC

## 2023-06-29 MED ORDER — FENTANYL CITRATE (PF) 250 MCG/5ML IJ SOLN
INTRAMUSCULAR | Status: DC | PRN
  Administered 2023-06-29: 100 ug via INTRAVENOUS

## 2023-06-29 MED ORDER — LIDOCAINE 2% (20 MG/ML) 5 ML SYRINGE
INTRAMUSCULAR | Status: AC
  Filled 2023-06-29: qty 5

## 2023-06-29 MED ORDER — ACETAMINOPHEN 500 MG PO TABS
ORAL_TABLET | ORAL | Status: AC
Start: 2023-06-29 — End: 2023-06-29
  Administered 2023-06-29: 1000 mg via ORAL
  Filled 2023-06-29: qty 2

## 2023-06-29 MED ORDER — EPHEDRINE SULFATE-NACL 50-0.9 MG/10ML-% IV SOSY
PREFILLED_SYRINGE | INTRAVENOUS | Status: DC | PRN
  Administered 2023-06-29: 5 mg via INTRAVENOUS

## 2023-06-29 MED ORDER — PROPOFOL 10 MG/ML IV BOLUS
INTRAVENOUS | Status: DC | PRN
  Administered 2023-06-29: 120 mg via INTRAVENOUS

## 2023-06-29 MED ORDER — ONDANSETRON HCL 4 MG/2ML IJ SOLN: INTRAMUSCULAR | Status: DC | PRN

## 2023-06-29 MED ORDER — DEXAMETHASONE SODIUM PHOSPHATE 10 MG/ML IJ SOLN
INTRAMUSCULAR | Status: DC | PRN
  Administered 2023-06-29: 10 mg via INTRAVENOUS

## 2023-06-29 MED ORDER — ACETAMINOPHEN 500 MG PO TABS: 1000.0000 mg | ORAL_TABLET | ORAL | Status: AC

## 2023-06-29 MED ORDER — OXYCODONE HCL 5 MG PO TABS
5.0000 mg | ORAL_TABLET | Freq: Once | ORAL | Status: AC | PRN
  Administered 2023-06-29: 5 mg via ORAL

## 2023-06-29 MED ORDER — MIDAZOLAM HCL 2 MG/2ML IJ SOLN: 1.0000 mg | Freq: Once | INTRAMUSCULAR | Status: AC

## 2023-06-29 MED ORDER — ONDANSETRON HCL 4 MG/2ML IJ SOLN
INTRAMUSCULAR | Status: DC | PRN
  Administered 2023-06-29: 4 mg via INTRAVENOUS

## 2023-06-29 MED ORDER — BUPIVACAINE-EPINEPHRINE (PF) 0.25% -1:200000 IJ SOLN
INTRAMUSCULAR | Status: AC
  Filled 2023-06-29: qty 30

## 2023-06-29 MED ORDER — CHLORHEXIDINE GLUCONATE 0.12 % MT SOLN: 15.0000 mL | Freq: Once | OROMUCOSAL | Status: AC

## 2023-06-29 MED ORDER — OXYCODONE HCL 5 MG/5ML PO SOLN: 5.0000 mg | Freq: Once | ORAL | Status: AC | PRN

## 2023-06-29 MED ORDER — PROPOFOL 10 MG/ML IV BOLUS
INTRAVENOUS | Status: AC
  Filled 2023-06-29: qty 20

## 2023-06-29 SURGICAL SUPPLY — 37 items
BAG COUNTER SPONGE SURGICOUNT (BAG) ×1 IMPLANT
BENZOIN TINCTURE PRP APPL 2/3 (GAUZE/BANDAGES/DRESSINGS) ×1 IMPLANT
BLADE CLIPPER SURG (BLADE) IMPLANT
CHLORAPREP W/TINT 26 (MISCELLANEOUS) ×1 IMPLANT
COVER SURGICAL LIGHT HANDLE (MISCELLANEOUS) ×1 IMPLANT
DRAIN PENROSE .5X12 LATEX STL (DRAIN) ×1 IMPLANT
DRAIN PENROSE 0.75X12 (DRAIN) IMPLANT
DRAPE LAPAROSCOPIC ABDOMINAL (DRAPES) IMPLANT
DRAPE LAPAROTOMY TRNSV 102X78 (DRAPES) IMPLANT
DRSG TEGADERM 4X4.75 (GAUZE/BANDAGES/DRESSINGS) ×1 IMPLANT
ELECT CAUTERY BLADE 6.4 (BLADE) IMPLANT
ELECTRODE REM PT RTRN 9FT ADLT (ELECTROSURGICAL) ×1 IMPLANT
GAUZE 4X4 16PLY ~~LOC~~+RFID DBL (SPONGE) ×1 IMPLANT
GAUZE SPONGE 4X4 12PLY STRL (GAUZE/BANDAGES/DRESSINGS) ×1 IMPLANT
GLOVE BIO SURGEON STRL SZ7 (GLOVE) ×1 IMPLANT
GLOVE BIOGEL PI IND STRL 7.5 (GLOVE) ×1 IMPLANT
GOWN STRL REUS W/ TWL LRG LVL3 (GOWN DISPOSABLE) ×2 IMPLANT
KIT BASIN OR (CUSTOM PROCEDURE TRAY) ×1 IMPLANT
KIT TURNOVER KIT B (KITS) ×1 IMPLANT
MESH PARIETEX PROGRIP RIGHT (Mesh General) IMPLANT
NDL HYPO 25GX1X1/2 BEV (NEEDLE) ×1 IMPLANT
NEEDLE HYPO 25GX1X1/2 BEV (NEEDLE) ×1 IMPLANT
NS IRRIG 1000ML POUR BTL (IV SOLUTION) ×1 IMPLANT
PACK GENERAL/GYN (CUSTOM PROCEDURE TRAY) ×1 IMPLANT
PAD ARMBOARD POSITIONER FOAM (MISCELLANEOUS) ×1 IMPLANT
SPONGE INTESTINAL PEANUT (DISPOSABLE) ×1 IMPLANT
STRIP CLOSURE SKIN 1/2X4 (GAUZE/BANDAGES/DRESSINGS) ×1 IMPLANT
SUT MNCRL AB 4-0 PS2 18 (SUTURE) ×1 IMPLANT
SUT PDS AB 0 CT 36 (SUTURE) IMPLANT
SUT SILK 2 0 SH (SUTURE) IMPLANT
SUT SILK 3-0 18XBRD TIE 12 (SUTURE) IMPLANT
SUT VIC AB 0 CT2 27 (SUTURE) ×1 IMPLANT
SUT VIC AB 2-0 SH 27X BRD (SUTURE) ×1 IMPLANT
SUT VIC AB 3-0 SH 27XBRD (SUTURE) ×1 IMPLANT
SYR CONTROL 10ML LL (SYRINGE) ×1 IMPLANT
TOWEL GREEN STERILE (TOWEL DISPOSABLE) ×1 IMPLANT
TOWEL GREEN STERILE FF (TOWEL DISPOSABLE) ×1 IMPLANT

## 2023-06-29 NOTE — Op Note (Signed)
 Right inguinal hernia repair with mesh Procedure Note  Indications: The patient presented with a history of a right, reducible inguinal hernia.    Pre-operative Diagnosis: right reducible inguinal hernia Post-operative Diagnosis: same  Surgeon: Rella Cardinal   Assistants: none  Anesthesia: General LMA anesthesia and TAP block  ASA Class: 2  Procedure Details  The patient was seen again in the Holding Room. The risks, benefits, complications, treatment options, and expected outcomes were discussed with the patient. The possibilities of reaction to medication, pulmonary aspiration, perforation of viscus, bleeding, recurrent infection, the need for additional procedures, and development of a complication requiring transfusion or further operation were discussed with the patient and/or family. The likelihood of success in repairing the hernia and returning the patient to their previous functional status is good.  There was concurrence with the proposed plan, and informed consent was obtained. The site of surgery was properly noted/marked. The patient was taken to the Operating Room, identified as Tom Gaines, and the procedure verified as right inguinal hernia repair. A Time Out was held and the above information confirmed.  The patient was placed in the supine position and underwent induction of anesthesia. The lower abdomen and groin was prepped with Chloraprep and draped in the standard fashion, and 0.25% Marcaine  with epinephrine  was used to anesthetize the skin over the mid-portion of the right inguinal canal. An oblique incision was made. Dissection was carried down through the subcutaneous tissue with cautery to the external oblique fascia.  We opened the external oblique fascia along the direction of its fibers to the external ring.  The spermatic cord was circumferentially dissected bluntly and retracted with a Penrose drain.  The ilioinguinal nerve was identified and preserved.  The  floor of the inguinal canal was inspected and was intact.  We skeletonized the spermatic cord and reduced a moderate-sized indirect hernia sac.  We used a right Progrip mesh which was inserted and deployed across the floor of the inguinal canal. The mesh was tucked underneath the external oblique fascia laterally.  The flap of the mesh was closed around the spermatic cord to recreate the internal inguinal ring.  The mesh was secured to the pubic tubercle with 0 Vicryl.  Additional stay sutures were used to attach the mesh to the shelving edge inferiorly.  The flap of the mesh was secured with 0 Vicryl.  The external oblique fascia was reapproximated with 2-0 Vicryl.  3-0 Vicryl was used to close the subcutaneous tissues and 4-0 Monocryl was used to close the skin in subcuticular fashion.  Benzoin and steri-strips were used to seal the incision.  A clean dressing was applied.  The patient was then extubated and brought to the recovery room in stable condition.  All sponge, instrument, and needle counts were correct prior to closure and at the conclusion of the case.   Estimated Blood Loss: Minimal                 Complications: None; patient tolerated the procedure well.         Disposition: PACU - hemodynamically stable.         Condition: stable  Tom Gaines. Tom Grizzle, MD, [facility]  General Surgery   06/29/2023 11:54 AM

## 2023-06-29 NOTE — Anesthesia Procedure Notes (Signed)
 Procedure Name: Intubation Date/Time: 06/29/2023 10:52 AM  Performed by: Khamryn Calderone J, CRNAPre-anesthesia Checklist: Patient identified, Emergency Drugs available, Suction available and Patient being monitored Patient Re-evaluated:Patient Re-evaluated prior to induction Oxygen Delivery Method: Circle System Utilized Preoxygenation: Pre-oxygenation with 100% oxygen Induction Type: IV induction Ventilation: Mask ventilation without difficulty LMA: LMA inserted LMA Size: 4.0 Tube type: Oral Number of attempts: 1 Airway Equipment and Method: Stylet and Oral airway Placement Confirmation: ETT inserted through vocal cords under direct vision, positive ETCO2 and breath sounds checked- equal and bilateral Tube secured with: Tape Dental Injury: Teeth and Oropharynx as per pre-operative assessment

## 2023-06-29 NOTE — Anesthesia Postprocedure Evaluation (Signed)
 Anesthesia Post Note  Patient: Tom Gaines  Procedure(s) Performed: REPAIR, HERNIA, INGUINAL, ADULT (Right)     Patient location during evaluation: PACU Anesthesia Type: General Level of consciousness: awake and alert and oriented Pain management: pain level controlled Vital Signs Assessment: post-procedure vital signs reviewed and stable Respiratory status: spontaneous breathing, nonlabored ventilation and respiratory function stable Cardiovascular status: blood pressure returned to baseline and stable Postop Assessment: no apparent nausea or vomiting Anesthetic complications: no   No notable events documented.  Last Vitals:  Vitals:   06/29/23 1215 06/29/23 1230  BP: (!) 158/93   Pulse: 60   Resp: 18   Temp:  36.7 C  SpO2: (!) 89%     Last Pain:  Vitals:   06/29/23 1229  PainSc: 2                  Fotini Lemus A.

## 2023-06-29 NOTE — Anesthesia Procedure Notes (Signed)
 Anesthesia Regional Block: TAP block   Pre-Anesthetic Checklist: , timeout performed,  Correct Patient, Correct Site, Correct Laterality,  Correct Procedure, Correct Position, site marked,  Risks and benefits discussed,  Surgical consent,  Pre-op evaluation,  At surgeon's request and post-op pain management  Laterality: Right  Prep: chloraprep       Needles:  Injection technique: Single-shot      Needle Length: 10cm  Needle Gauge: 21   Needle insertion depth: 6 cm   Additional Needles:   Procedures:,,,, ultrasound used (permanent image in chart),,    Narrative:  Start time: 06/29/2023 10:27 AM End time: 06/29/2023 10:32 AM Injection made incrementally with aspirations every 5 mL.  Performed by: Personally  Anesthesiologist: Tura Gaines, MD  Additional Notes: Timeout performed. Patient sedated. Relevant anatomy ID'd using US . Incremental 2-5ml injection of LA with frequent aspiration. Patient tolerated procedure well.

## 2023-06-29 NOTE — Transfer of Care (Signed)
 Immediate Anesthesia Transfer of Care Note  Patient: TADEN WITTER  Procedure(s) Performed: REPAIR, HERNIA, INGUINAL, ADULT (Right)  Patient Location: PACU  Anesthesia Type:General  Level of Consciousness: awake, alert , and oriented  Airway & Oxygen Therapy: Patient Spontanous Breathing and Patient connected to nasal cannula oxygen  Post-op Assessment: Report given to RN and Post -op Vital signs reviewed and stable  Post vital signs: Reviewed and stable  Last Vitals:  Vitals Value Taken Time  BP 150/81 06/29/23 1155  Temp    Pulse 63 06/29/23 1158  Resp 12 06/29/23 1158  SpO2 93 % 06/29/23 1158  Vitals shown include unfiled device data.  Last Pain:  Vitals:   06/29/23 0931  PainSc: 0-No pain         Complications: No notable events documented.

## 2023-06-29 NOTE — Discharge Instructions (Signed)

## 2023-06-29 NOTE — Interval H&P Note (Signed)
 History and Physical Interval Note:  06/29/2023 10:03 AM  Tom Gaines  has presented today for surgery, with the diagnosis of RIGHT INGUINAL HERNIA.  The various methods of treatment have been discussed with the patient and family. After consideration of risks, benefits and other options for treatment, the patient has consented to  Procedure(s) with comments: REPAIR, HERNIA, INGUINAL, ADULT (Right) - LATERALITY: RIGHT LMA & TAP BLOCK as a surgical intervention.  The patient's history has been reviewed, patient examined, no change in status, stable for surgery.  I have reviewed the patient's chart and labs.  Questions were answered to the patient's satisfaction.     Rella Cardinal

## 2023-06-30 ENCOUNTER — Encounter (HOSPITAL_COMMUNITY): Payer: Self-pay | Admitting: Surgery

## 2023-07-03 DIAGNOSIS — R972 Elevated prostate specific antigen [PSA]: Secondary | ICD-10-CM | POA: Diagnosis not present

## 2023-07-03 DIAGNOSIS — N529 Male erectile dysfunction, unspecified: Secondary | ICD-10-CM | POA: Diagnosis not present

## 2023-07-03 DIAGNOSIS — C61 Malignant neoplasm of prostate: Secondary | ICD-10-CM | POA: Diagnosis not present

## 2023-07-06 DIAGNOSIS — N529 Male erectile dysfunction, unspecified: Secondary | ICD-10-CM | POA: Diagnosis not present

## 2023-07-06 DIAGNOSIS — C61 Malignant neoplasm of prostate: Secondary | ICD-10-CM | POA: Diagnosis not present

## 2023-07-24 ENCOUNTER — Ambulatory Visit: Payer: Self-pay | Admitting: Surgery

## 2023-07-25 ENCOUNTER — Ambulatory Visit (INDEPENDENT_AMBULATORY_CARE_PROVIDER_SITE_OTHER): Admitting: Podiatry

## 2023-07-25 ENCOUNTER — Encounter: Payer: Self-pay | Admitting: Podiatry

## 2023-07-25 DIAGNOSIS — L84 Corns and callosities: Secondary | ICD-10-CM | POA: Diagnosis not present

## 2023-07-25 NOTE — Patient Instructions (Signed)
 Look for urea 40% cream or ointment and apply to the thickened dry skin / calluses. This can be bought over the counter, at a pharmacy or online such as [employer].

## 2023-07-25 NOTE — Progress Notes (Signed)
  Subjective:  Patient ID: Tom Gaines, male    DOB: 1945-05-30,  MRN: [id number]  Chief Complaint  Patient presents with   Callouses    I am developing large calluses on my left foot, under the big two joint. It often will crack open and feel sore. This looks similar to the right foot, where I know I have gout. I have not been advised that I have gout in my left foot so I am not sure of the cause of this issue.Pre-diabetes, takes a baby aspirin    78 y.o. male presents with the above complaint. History confirmed with patient.  He returns for follow-up feeling with a callus on the left side now as well  Objective:  Physical Exam: warm, good capillary refill, no trophic changes or ulcerative lesions, normal DP and PT pulses, normal sensory exam, and mild hallux valgus deformity bilateral with medial first MTP pinch callus.  Assessment:   1. Callus of foot      Plan:  Patient was evaluated and treated and all questions answered.  We discussed treatment of the calluses including using pumice stone and emollient cream such as urea 40% cream.  Return as needed if he needs debridement.  Also discussed custom molded orthoses and their role in offloading the area.  No follow-ups on file.

## 2023-08-10 ENCOUNTER — Ambulatory Visit: Admitting: Podiatry

## 2023-08-21 DIAGNOSIS — M25512 Pain in left shoulder: Secondary | ICD-10-CM | POA: Diagnosis not present

## 2023-08-21 DIAGNOSIS — M25511 Pain in right shoulder: Secondary | ICD-10-CM | POA: Diagnosis not present

## 2023-08-21 DIAGNOSIS — M1811 Unilateral primary osteoarthritis of first carpometacarpal joint, right hand: Secondary | ICD-10-CM | POA: Diagnosis not present

## 2023-08-29 ENCOUNTER — Other Ambulatory Visit: Payer: Self-pay | Admitting: Internal Medicine

## 2023-08-31 DIAGNOSIS — D3131 Benign neoplasm of right choroid: Secondary | ICD-10-CM | POA: Diagnosis not present

## 2023-08-31 DIAGNOSIS — H11132 Conjunctival pigmentations, left eye: Secondary | ICD-10-CM | POA: Diagnosis not present

## 2023-08-31 DIAGNOSIS — Z961 Presence of intraocular lens: Secondary | ICD-10-CM | POA: Diagnosis not present

## 2023-08-31 DIAGNOSIS — H26493 Other secondary cataract, bilateral: Secondary | ICD-10-CM | POA: Diagnosis not present

## 2023-08-31 DIAGNOSIS — H04123 Dry eye syndrome of bilateral lacrimal glands: Secondary | ICD-10-CM | POA: Diagnosis not present

## 2023-08-31 LAB — HM DIABETES EYE EXAM

## 2023-10-13 ENCOUNTER — Encounter: Payer: Self-pay | Admitting: Internal Medicine

## 2023-10-23 ENCOUNTER — Other Ambulatory Visit: Payer: Self-pay | Admitting: Internal Medicine

## 2023-10-23 ENCOUNTER — Other Ambulatory Visit: Payer: Self-pay | Admitting: Cardiology

## 2023-10-23 ENCOUNTER — Other Ambulatory Visit (INDEPENDENT_AMBULATORY_CARE_PROVIDER_SITE_OTHER)

## 2023-10-23 DIAGNOSIS — R7303 Prediabetes: Secondary | ICD-10-CM

## 2023-10-23 DIAGNOSIS — E7800 Pure hypercholesterolemia, unspecified: Secondary | ICD-10-CM

## 2023-10-23 DIAGNOSIS — R7983 Abnormal findings of blood amino-acid level: Secondary | ICD-10-CM

## 2023-10-23 DIAGNOSIS — N1832 Chronic kidney disease, stage 3b: Secondary | ICD-10-CM

## 2023-10-23 DIAGNOSIS — Z8739 Personal history of other diseases of the musculoskeletal system and connective tissue: Secondary | ICD-10-CM

## 2023-10-23 LAB — CBC WITH DIFFERENTIAL/PLATELET
Basophils Absolute: 0.1 K/uL (ref 0.0–0.1)
Basophils Relative: 0.8 % (ref 0.0–3.0)
Eosinophils Absolute: 0.1 K/uL (ref 0.0–0.7)
Eosinophils Relative: 2.1 % (ref 0.0–5.0)
HCT: 45.7 % (ref 39.0–52.0)
Hemoglobin: 15.1 g/dL (ref 13.0–17.0)
Lymphocytes Relative: 19.9 % (ref 12.0–46.0)
Lymphs Abs: 1.4 K/uL (ref 0.7–4.0)
MCHC: 32.9 g/dL (ref 30.0–36.0)
MCV: 99.3 fl (ref 78.0–100.0)
Monocytes Absolute: 0.7 K/uL (ref 0.1–1.0)
Monocytes Relative: 10.4 % (ref 3.0–12.0)
Neutro Abs: 4.8 K/uL (ref 1.4–7.7)
Neutrophils Relative %: 66.8 % (ref 43.0–77.0)
Platelets: 177 K/uL (ref 150.0–400.0)
RBC: 4.61 Mil/uL (ref 4.22–5.81)
RDW: 15.8 % — ABNORMAL HIGH (ref 11.5–15.5)
WBC: 7.2 K/uL (ref 4.0–10.5)

## 2023-10-23 LAB — URIC ACID: Uric Acid, Serum: 4.3 mg/dL (ref 4.0–7.8)

## 2023-10-23 LAB — COMPREHENSIVE METABOLIC PANEL WITH GFR
ALT: 32 U/L (ref 0–53)
AST: 41 U/L — ABNORMAL HIGH (ref 0–37)
Albumin: 4.3 g/dL (ref 3.5–5.2)
Alkaline Phosphatase: 71 U/L (ref 39–117)
BUN: 27 mg/dL — ABNORMAL HIGH (ref 6–23)
CO2: 29 meq/L (ref 19–32)
Calcium: 9.7 mg/dL (ref 8.4–10.5)
Chloride: 104 meq/L (ref 96–112)
Creatinine, Ser: 1.29 mg/dL (ref 0.40–1.50)
GFR: 53.11 mL/min — ABNORMAL LOW (ref >60.00)
Glucose, Bld: 90 mg/dL (ref 70–99)
Potassium: 4.1 meq/L (ref 3.5–5.1)
Sodium: 140 meq/L (ref 135–145)
Total Bilirubin: 0.6 mg/dL (ref 0.2–1.2)
Total Protein: 6.9 g/dL (ref 6.0–8.3)

## 2023-10-23 LAB — HEMOGLOBIN A1C: Hgb A1c MFr Bld: 6.2 % (ref 4.6–6.5)

## 2023-10-23 LAB — LIPID PANEL
Cholesterol: 156 mg/dL (ref 0–200)
HDL: 95.8 mg/dL (ref >39.00)
LDL Cholesterol: 49 mg/dL (ref 0–99)
NonHDL: 59.84
Total CHOL/HDL Ratio: 2
Triglycerides: 52 mg/dL (ref 0.0–149.0)
VLDL: 10.4 mg/dL (ref 0.0–40.0)

## 2023-10-24 NOTE — Patient Instructions (Addendum)
      Blood work was ordered.       Medications changes include :   None    A referral was ordered and someone will call you to schedule an appointment.     Return in about 6 months (around 04/23/2024) for follow up.

## 2023-10-24 NOTE — Progress Notes (Unsigned)
 Subjective:    Patient ID: Tom Gaines, male    DOB: [birth year], 78 y.o.   MRN: [id number]     HPI Tom Gaines is here for follow up of his chronic medical problems.  Had labs  S/p hernia surgery 06/2023   [facility] - there has more variability in his readings-all of his numbers have been less than 140/90.  He typically has not had readings in the 130s but he has seen that more recently.  B/l shoudler OA, right thumb OA - injections by dr josefina Makua vein - superficial and bilateral upper legs- no swelling or discomfort.     Medications and allergies reviewed with patient and updated if appropriate.  Current Outpatient Medications on File Prior to Visit  Medication Sig Dispense Refill   acyclovir (ZOVIRAX) 800 MG tablet Take 400 mg by mouth 2 (two) times daily.     allopurinol  (ZYLOPRIM ) 300 MG tablet TAKE 1 TABLET BY MOUTH EVERY DAY 90 tablet 1   ASPIRIN 81 PO Take 81 mg by mouth daily.     Azelastine  HCl 137 MCG/SPRAY SOLN PLACE 1-2 SPRAYS INTO BOTH NOSTRILS 2 (TWO) TIMES DAILY AS NEEDED. USE IN EACH NOSTRIL AS DIRECTED 30 mL 3   famotidine  (PEPCID ) 40 MG tablet Take 1 tablet (40 mg total) by mouth 2 (two) times daily. 60 tablet 11   ipratropium (ATROVENT) 0.06 % nasal spray Place 2 sprays into both nostrils daily as needed for rhinitis.     rosuvastatin  (CRESTOR ) 40 MG tablet TAKE 1 TABLET BY MOUTH EVERY DAY 90 tablet 3   traMADol  (ULTRAM ) 50 MG tablet Take 1 tablet (50 mg total) by mouth daily. For chronic back pain 90 tablet 0   No current facility-administered medications on file prior to visit.     Review of Systems  Constitutional:  Positive for fatigue (mid day). Negative for chills and fever.  Respiratory:  Negative for cough, shortness of breath and wheezing.   Cardiovascular:  Negative for chest pain, palpitations and leg swelling.  Neurological:  Negative for light-headedness and headaches.       Objective:    Vitals:   10/25/23 1520  BP: 104/68  Pulse: 88  Temp: 98 F (36.7 C)  SpO2: 98%   BP Readings from Last 3 Encounters:  10/25/23 104/68  06/29/23 (!) 158/93  06/21/23 127/80   Wt Readings from Last 3 Encounters:  10/25/23 171 lb (77.6 kg)  06/29/23 162 lb (73.5 kg)  06/21/23 169 lb 12.8 oz (77 kg)   Body mass index is 23.85 kg/m.    Physical Exam Constitutional:      General: He is not in acute distress.    Appearance: Normal appearance. He is not ill-appearing.  HENT:     Head: Normocephalic and atraumatic.  Eyes:     Conjunctiva/sclera: Conjunctivae normal.  Cardiovascular:     Rate and Rhythm: Normal rate and regular rhythm.     Heart sounds: Normal heart sounds.     Comments: Bilateral proximal lower extremities varicose veins - superficial  - larger capillary varicosities Pulmonary:     Effort: Pulmonary effort is normal. No respiratory distress.     Breath sounds: Normal breath sounds. No wheezing or rales.  Musculoskeletal:     Right lower leg: No edema.     Left lower leg: No edema.  Skin:    General: Skin is warm and dry.  Findings: No rash.  Neurological:     Mental Status: He is alert. Mental status is at baseline.  Psychiatric:        Mood and Affect: Mood normal.        Lab Results  Component Value Date   WBC 7.2 10/23/2023   HGB 15.1 10/23/2023   HCT 45.7 10/23/2023   PLT 177.0 10/23/2023   GLUCOSE 90 10/23/2023   CHOL 156 10/23/2023   TRIG 52.0 10/23/2023   HDL 95.80 10/23/2023   LDLDIRECT 83.0 10/09/2019   LDLCALC 49 10/23/2023   ALT 32 10/23/2023   AST 41 (H) 10/23/2023   NA 140 10/23/2023   K 4.1 10/23/2023   CL 104 10/23/2023   CREATININE 1.29 10/23/2023   BUN 27 (H) 10/23/2023   CO2 29 10/23/2023   TSH 1.60 10/19/2021   PSA 0.60 03/01/2010   HGBA1C 6.2 10/23/2023     Assessment & Plan:    See Problem List for Assessment and Plan of chronic medical problems.

## 2023-10-25 ENCOUNTER — Ambulatory Visit: Admitting: Internal Medicine

## 2023-10-25 ENCOUNTER — Encounter: Payer: Self-pay | Admitting: Internal Medicine

## 2023-10-25 VITALS — BP 104/68 | HR 88 | Temp 98.0°F | Ht 71.0 in | Wt 171.0 lb

## 2023-10-25 DIAGNOSIS — R7983 Abnormal findings of blood amino-acid level: Secondary | ICD-10-CM

## 2023-10-25 DIAGNOSIS — I8393 Asymptomatic varicose veins of bilateral lower extremities: Secondary | ICD-10-CM | POA: Diagnosis not present

## 2023-10-25 DIAGNOSIS — R7303 Prediabetes: Secondary | ICD-10-CM

## 2023-10-25 DIAGNOSIS — R5383 Other fatigue: Secondary | ICD-10-CM

## 2023-10-25 DIAGNOSIS — Z8739 Personal history of other diseases of the musculoskeletal system and connective tissue: Secondary | ICD-10-CM

## 2023-10-25 DIAGNOSIS — N1831 Chronic kidney disease, stage 3a: Secondary | ICD-10-CM | POA: Diagnosis not present

## 2023-10-25 DIAGNOSIS — E7800 Pure hypercholesterolemia, unspecified: Secondary | ICD-10-CM

## 2023-10-25 DIAGNOSIS — M545 Low back pain, unspecified: Secondary | ICD-10-CM

## 2023-10-25 DIAGNOSIS — G8929 Other chronic pain: Secondary | ICD-10-CM

## 2023-10-25 LAB — HOMOCYSTEINE: Homocysteine: 11.5 umol/L (ref <=15.2)

## 2023-10-25 NOTE — Assessment & Plan Note (Signed)
 Having bilateral small varicose veins in his upper legs Asymptomatic Denies any pain, discomfort or leg swelling Discussed that is probably related to deeper varicose veins Treatment if desired would be cosmetic Advised monitoring for now

## 2023-10-25 NOTE — Assessment & Plan Note (Signed)
 Chronic Denies episodes of gout Continue allopurinol  300 mg daily Uric acid level well-controlled

## 2023-10-25 NOTE — Assessment & Plan Note (Signed)
 Chronic Lab Results  Component Value Date   HGBA1C 6.2 10/23/2023   Sugars stable in prediabetic range-slightly higher than 6 months ago Continue healthy diet, regular exercise

## 2023-10-25 NOTE — Assessment & Plan Note (Signed)
 Chronic Coronary calcium  score 320 Regular exercise and healthy diet  Lab Results  Component Value Date   LDLCALC 49 10/23/2023   Lipids well controlled and at goal Continue Crestor  40 mg daily

## 2023-10-25 NOTE — Assessment & Plan Note (Addendum)
 Chronic Improved with supplementation of a few vitamin B's Continue folate, B12 and B6 supplementation Homocystine level normal

## 2023-10-25 NOTE — Assessment & Plan Note (Signed)
 Chronic Doing back exercises regularly Pain controlled Continue tramadol 50 mg daily as needed Continue Tylenol as needed Will refer to PT as needed

## 2023-10-25 NOTE — Assessment & Plan Note (Signed)
 Chronic Following with nephrology Blood work done-reviewed Overall GFR stable

## 2023-10-25 NOTE — Assessment & Plan Note (Signed)
 Chronic He has had fatigue for a while that is typically in the late afternoon where he can fall asleep His watch has told him he wakes more often than he is aware of waking He is not sure if he snores, no waking up gasping or choking ?  Sleep apnea Referral to neurology for further evaluation

## 2023-11-06 ENCOUNTER — Encounter: Payer: Self-pay | Admitting: Internal Medicine

## 2023-11-22 DIAGNOSIS — M7541 Impingement syndrome of right shoulder: Secondary | ICD-10-CM | POA: Diagnosis not present

## 2023-11-22 DIAGNOSIS — N1831 Chronic kidney disease, stage 3a: Secondary | ICD-10-CM | POA: Diagnosis not present

## 2023-11-22 DIAGNOSIS — M7542 Impingement syndrome of left shoulder: Secondary | ICD-10-CM | POA: Diagnosis not present

## 2023-11-23 ENCOUNTER — Institutional Professional Consult (permissible substitution): Admitting: Neurology

## 2023-11-27 DIAGNOSIS — K2270 Barrett's esophagus without dysplasia: Secondary | ICD-10-CM | POA: Diagnosis not present

## 2023-11-27 DIAGNOSIS — N1831 Chronic kidney disease, stage 3a: Secondary | ICD-10-CM | POA: Diagnosis not present

## 2023-11-27 DIAGNOSIS — M109 Gout, unspecified: Secondary | ICD-10-CM | POA: Diagnosis not present

## 2023-11-27 DIAGNOSIS — E7211 Homocystinuria: Secondary | ICD-10-CM | POA: Diagnosis not present

## 2023-11-27 DIAGNOSIS — I5189 Other ill-defined heart diseases: Secondary | ICD-10-CM | POA: Diagnosis not present

## 2023-11-27 DIAGNOSIS — R5383 Other fatigue: Secondary | ICD-10-CM | POA: Diagnosis not present

## 2023-11-27 DIAGNOSIS — R7303 Prediabetes: Secondary | ICD-10-CM | POA: Diagnosis not present

## 2023-11-27 DIAGNOSIS — N2581 Secondary hyperparathyroidism of renal origin: Secondary | ICD-10-CM | POA: Diagnosis not present

## 2023-11-27 DIAGNOSIS — I77810 Thoracic aortic ectasia: Secondary | ICD-10-CM | POA: Diagnosis not present

## 2023-11-27 DIAGNOSIS — E785 Hyperlipidemia, unspecified: Secondary | ICD-10-CM | POA: Diagnosis not present

## 2023-12-02 ENCOUNTER — Encounter: Payer: Self-pay | Admitting: Internal Medicine

## 2023-12-04 ENCOUNTER — Other Ambulatory Visit: Payer: Self-pay

## 2023-12-04 MED ORDER — FAMOTIDINE 40 MG PO TABS: 40.0000 mg | ORAL_TABLET | Freq: Two times a day (BID) | ORAL | 11 refills | Status: AC

## 2023-12-07 ENCOUNTER — Encounter: Payer: Self-pay | Admitting: *Deleted

## 2023-12-10 IMAGING — RF DG ESOPHAGUS
10 of 12 series · 14 of 24 positions shown · non-contrast
Comparison: None.

CLINICAL DATA: Feeling of food sticking in throat on occasion.
Worse with chicken.

EXAM:
ESOPHOGRAM / BARIUM SWALLOW / BARIUM TABLET STUDY
TECHNIQUE: Combined double contrast and single contrast examination performed
using effervescent crystals, thick barium liquid, and thin barium
liquid. The patient was observed with fluoroscopy swallowing a 13 mm
barium sulphate tablet.
FLUOROSCOPY:
Radiation Exposure Index (as provided by the fluoroscopic device):
21.4 mGy Kerma

[Series 1: cp_standard · 2 of 115 frames shown (1 of 4)]
[frame 18/115]
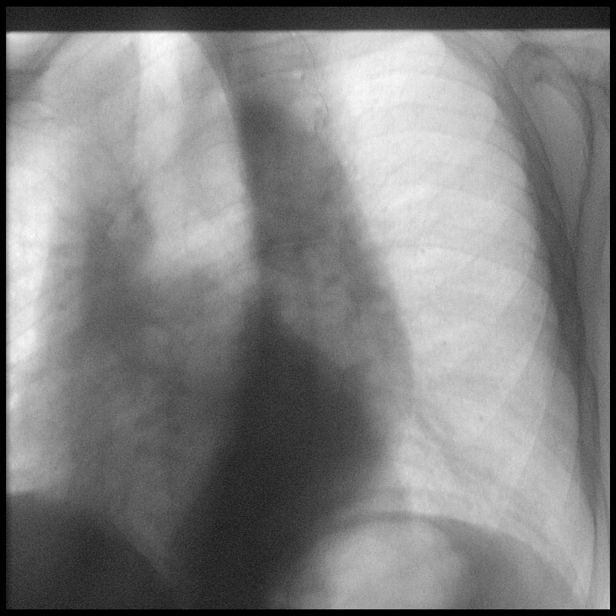
[frame 58/115]
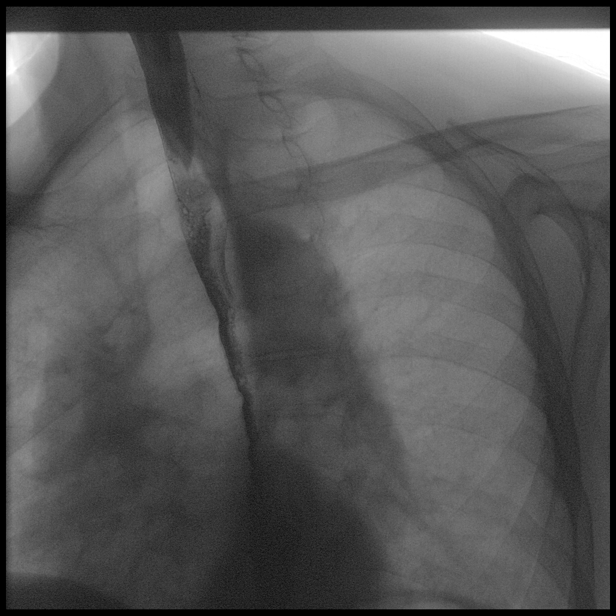

[Series 2: fluoro_barium 2fps_bw · 1 of 2 frames shown (1 of 6)]
[frame 1/2]
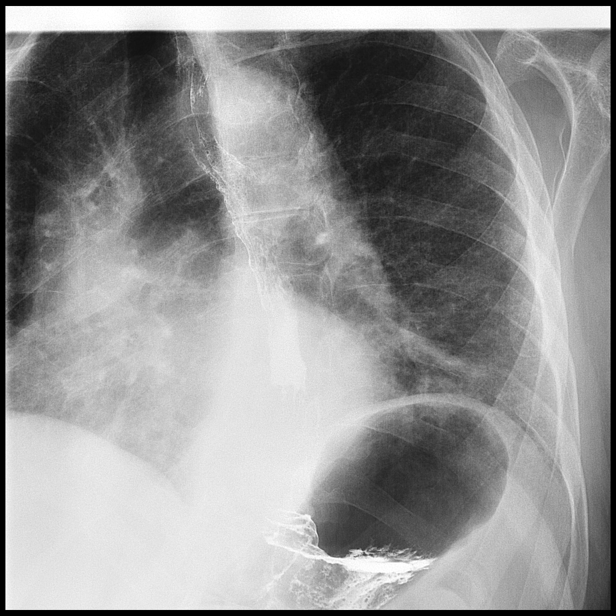

[Series 3: fluoro_barium 2fps_bw · 2 of 2 frames shown (2 of 6)]
[frame 1/2]
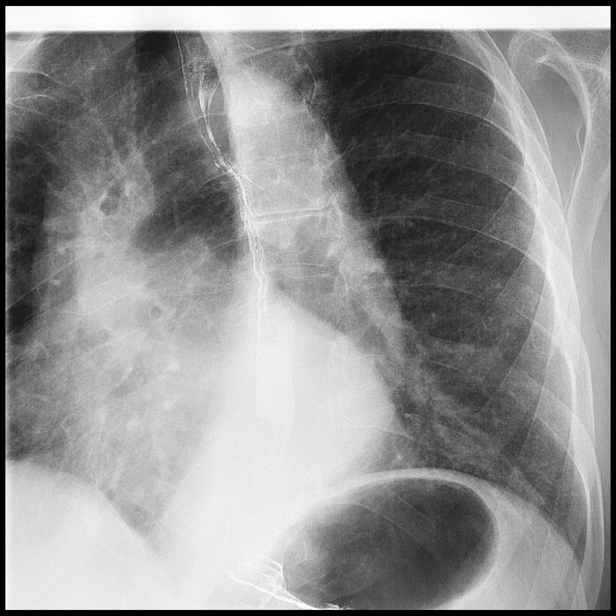
[frame 2/2]
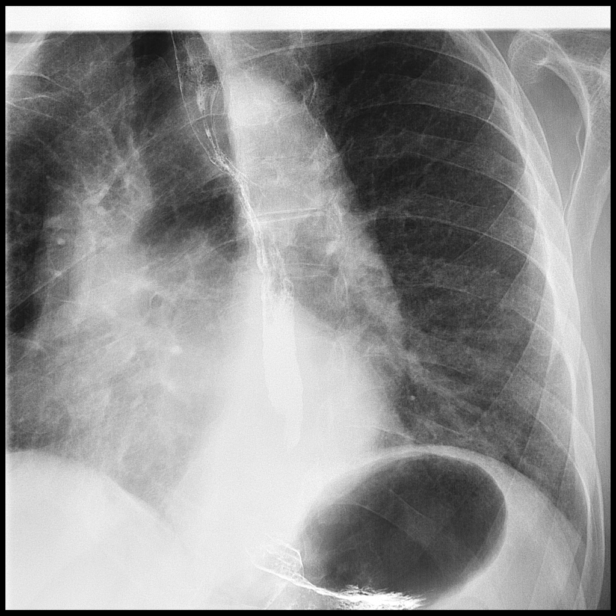

[Series 4: cp_standard · 1 of 95 frames shown (2 of 4)]
[frame 48/95]
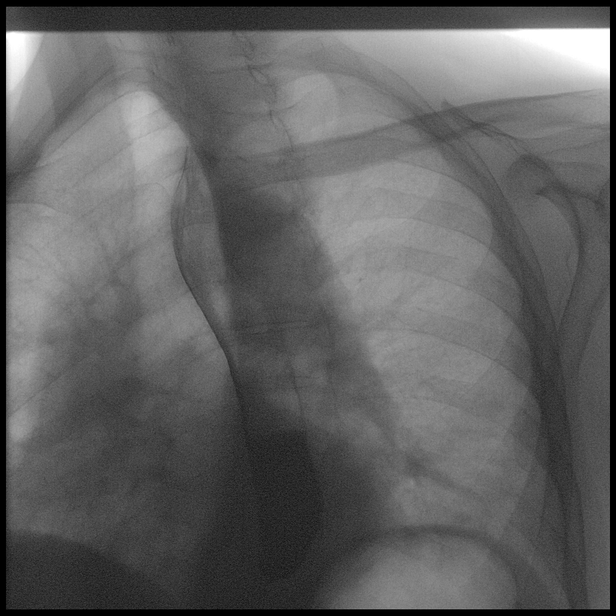

[Series 5: fluoro_barium 2fps_bw · 2 of 2 frames shown (3 of 6)]
[frame 1/2]
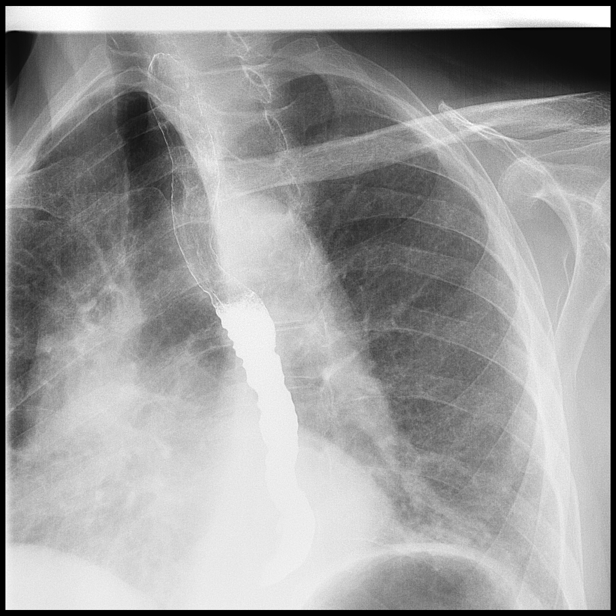
[frame 2/2]
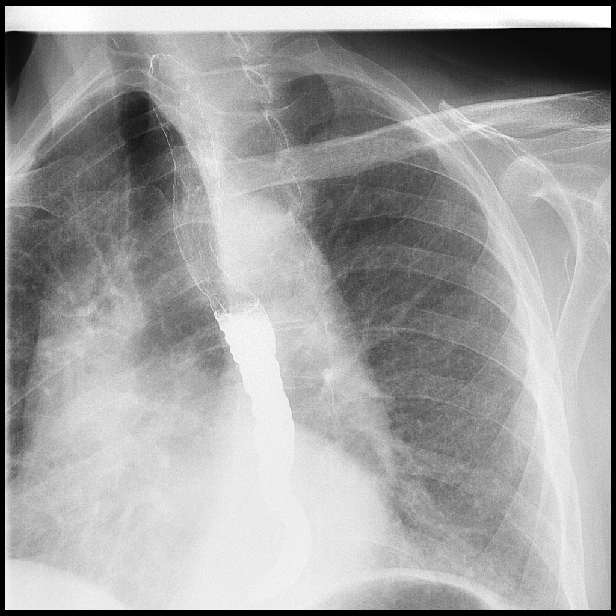

[Series 6: fluoro_barium 2fps_bw · 1 of 2 frames shown (4 of 6)]
[frame 2/2]
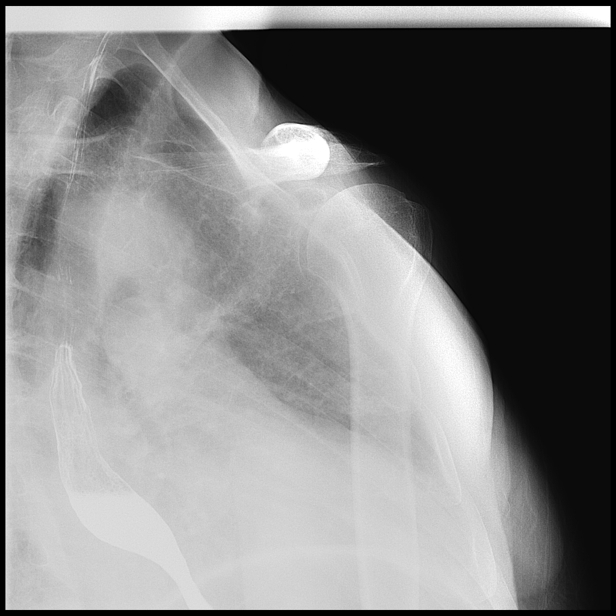

[Series 7: fluoro_barium 2fps_bw · 2 of 5 frames shown (5 of 6)]
[frame 2/5]
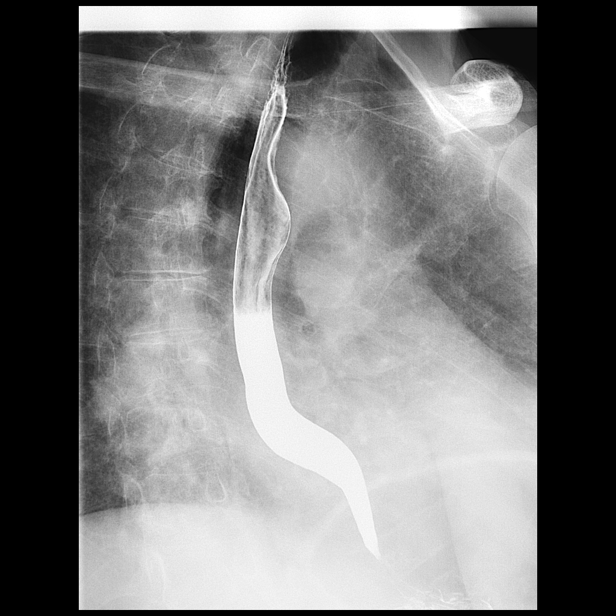
[frame 5/5]
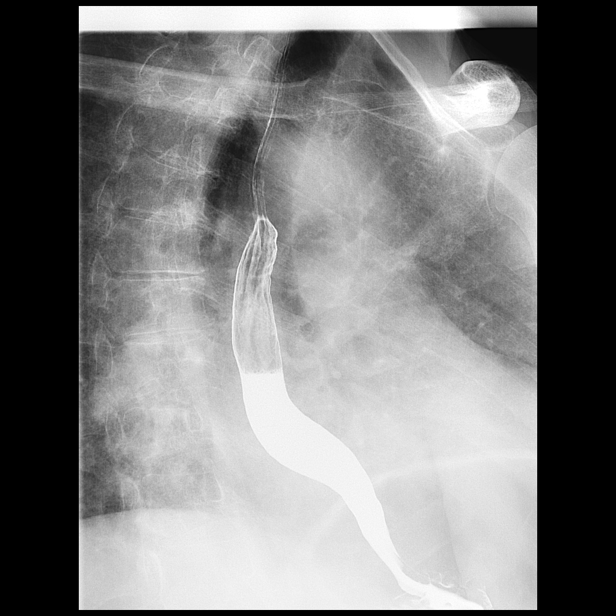

[Series 8: cp_standard · 1 of 1 slices shown (3 of 4)]
[im 1/1]
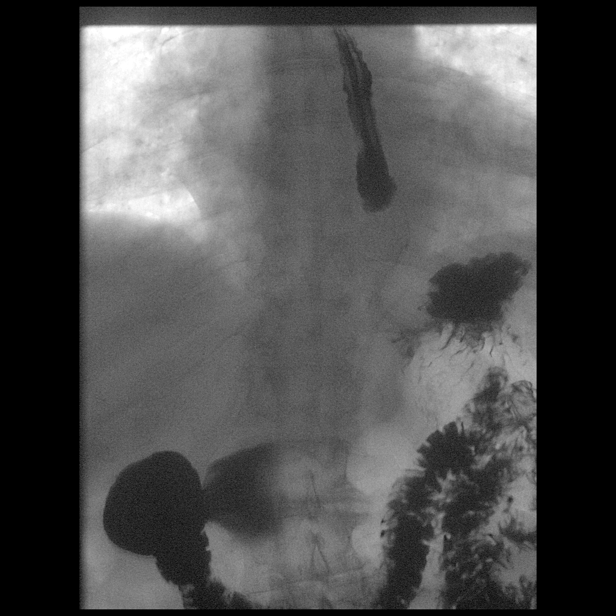

[Series 10: fluoro_barium 2fps_bw · 1 of 1 slices shown (6 of 6)]
[im 1/1]
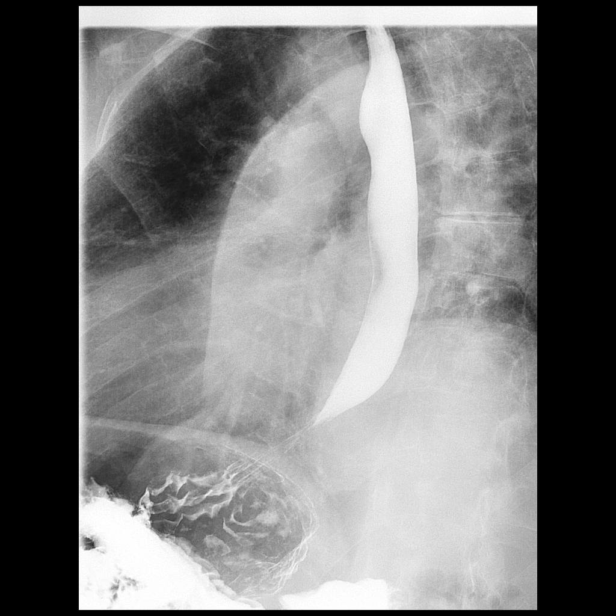

[Series 12: cp_standard · 1 of 1 slices shown (4 of 4)]
[im 1/1]
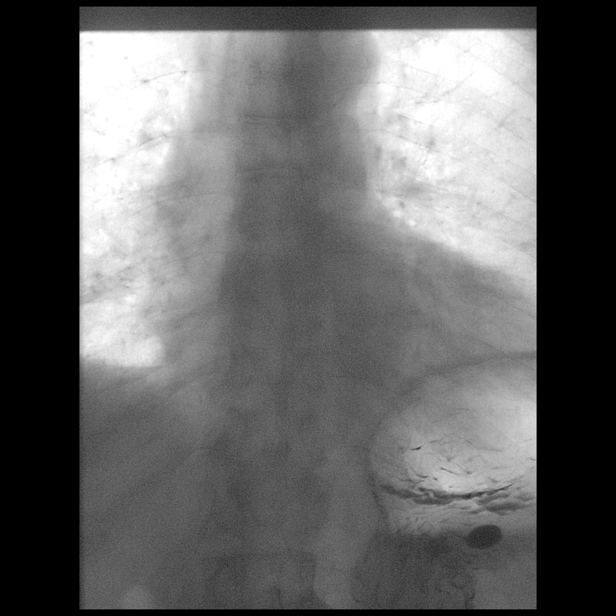

[14 of 24 positions shown; findings below may reference images not displayed]

FINDINGS: No mucosal irregularity in the thoracic esophagus or distal
esophagus. No stricture or mass at GE junction. Normal esophageal
motility.

Moderate gastroesophageal reflux was demonstrated. 13 mm barium tab
passed GE junction easily. No hiatal hernia
IMPRESSION: 1. No mucosal irregularity in the esophagus.
2. Moderate gastroesophageal reflux.
3. Normal esophageal motility.

## 2024-01-04 ENCOUNTER — Ambulatory Visit: Admitting: Neurology

## 2024-01-04 ENCOUNTER — Encounter: Payer: Self-pay | Admitting: Neurology

## 2024-01-04 VITALS — BP 149/95 | HR 106 | Ht 71.0 in | Wt 171.0 lb

## 2024-01-04 DIAGNOSIS — Z9189 Other specified personal risk factors, not elsewhere classified: Secondary | ICD-10-CM

## 2024-01-04 DIAGNOSIS — R0683 Snoring: Secondary | ICD-10-CM

## 2024-01-04 DIAGNOSIS — G4719 Other hypersomnia: Secondary | ICD-10-CM | POA: Diagnosis not present

## 2024-01-04 DIAGNOSIS — R351 Nocturia: Secondary | ICD-10-CM

## 2024-01-04 DIAGNOSIS — R030 Elevated blood-pressure reading, without diagnosis of hypertension: Secondary | ICD-10-CM | POA: Diagnosis not present

## 2024-01-04 NOTE — Patient Instructions (Signed)

## 2024-01-04 NOTE — Progress Notes (Signed)
 Subjective:    Patient ID: Tom Gaines is a 78 y.o. male.  HPI    True Mar, MD, PhD [facility] [address], [address] [address] [place], [place] [address]  Dear Dr. Geofm,   I saw your patient, Tom Gaines, upon your kind request in my sleep clinic today for initial consultation of his sleep disorder, in particular, concern for underlying obstructive sleep apnea.  The patient is unaccompanied today.  As you know, Tom Gaines is a 78 year old male with an underlying complex medical history of chronic kidney disease, homocystinemia, hyperlipidemia, gout, low back pain, allergy, arthritis, aortic dilatation, Barrett syndrome, basal cell cancer, bursitis, prediabetes, prostate cancer, status post prostatectomy, status post shoulder surgery, tested post tonsillectomy and adenoidectomy, status post cataract surgeries, lumbar spine surgery, and hernia repair, who reports snoring and excessive daytime somnolence.  His Epworth sleepiness score is 10 out of 24, fatigue severity score is 17 out of 63.  I reviewed your office note from 10/25/2023.  He has nocturia.  He drinks caffeine in the form of coffee, about 16 ounces in the mornings and recently has had some green tea daily.  He drinks alcohol daily for the past few months, 2 glasses of wine.  He reports that he recently restarted drinking alcohol after not drinking any for 13 years.  He has been advised to scale back on his alcohol consumption as he is certainly drinking more than what is typically recommended on a weekly basis.  He has used a bite guard in the past for grinding.  He has 2 dogs in the household who sleep in the room but not on the bed.  He has a TV in the bedroom but it is typically not on at night.  He quit smoking over 35 years ago.   He feels sleepy particularly in the afternoons.  He is widowed and lives alone, he has a lady friend that stays with him sometimes, she has noted his snoring and she herself  uses a CPAP machine.  He has no family history of sleep apnea.  Bedtime is generally around 10 and rise time around 6.   His blood pressure is mildly elevated today.  He denies any symptoms from elevated blood pressure such as head pressure or eye pressure or chest pain.  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 LDL goal equal less than 160, ideally less than 130. No FH MI or CVA   Low back pain 03/13/2014   Chronic, has seen Dr Claudene and Dr Gaspar Taking tramadol  as needed   Nonallopathic lesion of  lumbosacral region 12/01/2015   Nonallopathic lesion of sacral region 12/01/2015   Nonallopathic lesion of thoracic region 12/01/2015   Pre-diabetes    Prediabetes 03/29/2016   Mom, sister DM   PROSTATE CANCER, HX OF 03/12/2008   Qualifier: Diagnosis of  By: Tish MD, [facility]   Radical prostatectomy 1998, Urologist Dr. Renay Moats     His Past Surgical History Is Significant For: Past Surgical History:  Procedure Laterality Date   CATARACT EXTRACTION, BILATERAL  2017   colonoscopy with polypectomy  2014   Dr Aneita FONTANA HERNIA REPAIR Right 06/29/2023   Procedure: REPAIR, HERNIA, INGUINAL,  ADULT;  Surgeon: Belinda Cough, MD;  Location: [facility] OR;  Service: General;  Laterality: Right;  LATERALITY: RIGHT LMA & TAP BLOCK   LUMBAR DISC SURGERY  2002   Dr Colon   PROSTATECTOMY  1998   SHOULDER ARTHROSCOPY     right; 2011; left 1990   SIGMOIDOSCOPY     TONSILLECTOMY AND ADENOIDECTOMY      His Family History Is Significant For: Family History  Problem Relation Age of Onset   Diabetes Mother    Dementia Mother    Diabetes Sister    Healthy Daughter    Lung cancer Other        maternal uncle & aunt   Colon cancer Neg Hx    Stomach cancer Neg Hx    Arthritis Neg Hx    Heart disease Neg Hx    Stroke Neg Hx    Esophageal cancer Neg Hx    Liver cancer Neg Hx    Pancreatic cancer Neg Hx    Rectal cancer Neg Hx    Sleep apnea Neg Hx     His Social History Is Significant For: Social History   Socioeconomic History   Marital status: Single    Spouse name: Not on file   Number of children: Not on file   Years of education: Not on file   Highest education level: Professional school degree (e.g., MD, DDS, DVM, JD)  Occupational History   Occupation: retired  Tobacco Use   Smoking status: Former    Current packs/day: 0.00    Types: Cigarettes    Quit date: 01/31/1985    Years since quitting: 38.9    Passive exposure: Past   Smokeless tobacco: Never   Tobacco comments:    smoked  1965-   1987, up to 2.5 ppd  Vaping Use   Vaping status: Never Used  Substance and Sexual Activity   Alcohol use: Yes    Alcohol/week: 14.0 standard drinks of alcohol    Types: 14 Glasses of wine per week    Comment:  none since 2012   Drug use: No   Sexual activity: Yes  Other Topics Concern   Not on file  Social History Narrative   Exercises regularly - gym 5 days a week   Retired    Lives alone    Social Drivers of Health   Financial Resource Strain: Low Risk  (10/25/2023)   Overall Financial Resource Strain (CARDIA)    Difficulty of Paying Living Expenses: Not hard at all   Food Insecurity: No Food Insecurity (10/25/2023)   Hunger Vital Sign    Worried About Running Out of Food in the Last Year: Never true    Ran Out of Food in the Last Year: Never true  Transportation Needs: No Transportation Needs (10/25/2023)   PRAPARE - Administrator, Civil Service (Medical): No  Lack of Transportation (Non-Medical): No  Physical Activity: Sufficiently Active (10/25/2023)   Exercise Vital Sign    Days of Exercise per Week: 5 days    Minutes of Exercise per Session: 40 min  Stress: No Stress Concern Present (10/25/2023)   [employer] of Occupational Health - Occupational Stress Questionnaire    Feeling of Stress: Not at all  Social Connections: Socially Isolated (10/25/2023)   Social Connection and Isolation Panel    Frequency of Communication with Friends and Family: Three times a week    Frequency of Social Gatherings with Friends and Family: Three times a week    Attends Religious Services: Never    Active Member of Clubs or Organizations: No    Attends Banker Meetings: Not on file    Marital Status: Widowed    His Allergies Are:  No Known Allergies:   His Current Medications Are:  Outpatient Encounter Medications as of 01/04/2024  Medication Sig   acyclovir (ZOVIRAX) 800 MG tablet Take 400 mg by mouth 2 (two) times daily.   allopurinol  (ZYLOPRIM ) 300 MG tablet TAKE 1 TABLET BY MOUTH EVERY DAY   amoxicillin -clavulanate (AUGMENTIN ) 875-125 MG tablet Take 1 tablet by mouth 2 (two) times daily.   ASPIRIN 81 PO Take 81 mg by mouth daily.   Azelastine  HCl 137 MCG/SPRAY SOLN PLACE 1-2 SPRAYS INTO BOTH NOSTRILS 2 (TWO) TIMES DAILY AS NEEDED. USE IN EACH NOSTRIL AS DIRECTED (Patient taking differently: as needed. PLACE 1-2 SPRAYS INTO BOTH NOSTRILS 2 (TWO) TIMES DAILY AS NEEDED. USE IN EACH NOSTRIL AS DIRECTED)   famotidine  (PEPCID ) 40 MG tablet Take 1 tablet (40 mg total) by mouth 2 (two) times daily.   ipratropium (ATROVENT) 0.06 %  nasal spray Place 2 sprays into both nostrils daily as needed for rhinitis.   rosuvastatin  (CRESTOR ) 40 MG tablet TAKE 1 TABLET BY MOUTH EVERY DAY   traMADol  (ULTRAM ) 50 MG tablet Take 1 tablet (50 mg total) by mouth daily. For chronic back pain (Patient taking differently: Take 50 mg by mouth as needed. For chronic back pain)   No facility-administered encounter medications on file as of 01/04/2024.  :   Review of Systems:  Out of a complete 14 point review of systems, all are reviewed and negative with the exception of these symptoms as listed below:  Review of Systems  Objective:  Neurological Exam  Physical Exam Physical Examination:   Vitals:   01/04/24 1222  BP: (!) 149/95  Pulse: (!) 106    General Examination: The patient is a very pleasant 78 y.o. male in no acute distress. He appears well-developed and well-nourished and well groomed.   HEENT: Normocephalic, atraumatic, pupils are equal, round and reactive to light, extraocular tracking is good without limitation to gaze excursion or nystagmus noted. No photophobia. Hearing is grossly intact.  Face is symmetric with normal facial animation. Speech is clear without dysarthria. There is no hypophonia. There is no lip, neck/head, jaw or voice tremor. Neck is supple with full range of passive and active motion. There are no carotid bruits on auscultation.  Airway/Oropharynx exam reveals: mild mouth dryness, adequate dental hygiene and moderate airway crowding, due to small airway entry and redundant soft palate, tonsils absent, Mallampati class II.  Neck circumference 16-1/2 inches, minimal overbite noted.  Tongue protrudes centrally and palate elevates symmetrically.   Chest: Clear to auscultation without wheezing, rhonchi or crackles noted.  Heart: S1+S2+0, regular and normal without murmurs, rubs or gallops noted.   Abdomen:  Soft, non-tender and non-distended.  Extremities: There is no pitting edema in the distal lower  extremities bilaterally.   Skin: Warm and dry without trophic changes noted.   Musculoskeletal: exam reveals no obvious joint deformities.   Neurologically:  Mental status: The patient is awake, alert and oriented in all 4 spheres. His immediate and remote memory, attention, language skills and fund of knowledge are appropriate. There is no evidence of aphasia, agnosia, apraxia or anomia. Speech is clear with normal prosody and enunciation. Thought process is linear. Mood is normal and affect is normal.  Cranial nerves II - XII are as described above under HEENT exam.  Motor exam: Normal bulk, moving all 4 extremities without restriction, no obvious action or resting tremor.  Fine motor skills and coordination: Intact grossly.  Cerebellar testing: No dysmetria or intention tremor. There is no truncal or gait ataxia.  Sensory exam: intact to light touch in the upper and lower extremities.  Gait, station and balance: He stands easily. No veering to one side is noted. No leaning to one side is noted. Posture is age-appropriate and stance is narrow based. Gait shows normal stride length and normal pace. No problems turning are noted.   Assessment and Plan:  In summary, Tom Gaines is a very pleasant 78 y.o.-year old male with an underlying complex medical history of chronic kidney disease, homocystinemia, hyperlipidemia, gout, low back pain, allergy, arthritis, aortic dilatation, Barrett syndrome, basal cell cancer, bursitis, prediabetes, prostate cancer, status post prostatectomy, status post shoulder surgery, tested post tonsillectomy and adenoidectomy, status post cataract surgeries, lumbar spine surgery, and hernia repair, whose history and physical exam are concerning for sleep disordered breathing, particularly obstructive sleep apnea (OSA). A laboratory attended sleep study is typically considered gold standard for evaluation of sleep disordered breathing.   I had a long chat with the  patient about my findings and the diagnosis of sleep apnea, particularly OSA, its prognosis and treatment options. We talked about medical/conservative treatments, surgical interventions and non-pharmacological approaches for symptom control. I explained, in particular, the risks and ramifications of untreated moderate to severe OSA, especially with respect to developing cardiovascular disease down the road, including congestive heart failure (CHF), difficult to treat hypertension, cardiac arrhythmias (particularly A-fib), neurovascular complications including TIA, stroke and dementia. Even type 2 diabetes has, in part, been linked to untreated OSA. Symptoms of untreated OSA may include (but may not be limited to) daytime sleepiness, nocturia (i.e. frequent nighttime urination), memory problems, mood irritability and suboptimally controlled or worsening mood disorder such as depression and/or anxiety, lack of energy, lack of motivation, physical discomfort, as well as recurrent headaches, especially morning or nocturnal headaches. We talked about the importance of maintaining a healthy lifestyle and striving for healthy weight. In addition, we talked about the importance of striving for and maintaining good sleep hygiene.  He is advised to scale back on his daily alcohol consumption. I recommended a sleep study at this time. I outlined the differences between a laboratory attended sleep study which is considered more comprehensive and accurate over the option of a home sleep test (HST); the latter may lead to underestimation of sleep disordered breathing in some instances and does not help with diagnosing upper airway resistance syndrome and is not accurate enough to diagnose primary central sleep apnea typically. I outlined possible surgical and non-surgical treatment options of OSA, including the use of a positive airway pressure (PAP) device (i.e. CPAP, AutoPAP/APAP or BiPAP in certain circumstances), a  custom-made dental  device (aka oral appliance, which would require a referral to a specialist dentist or orthodontist typically, and is generally speaking not considered for patients with full dentures or edentulous state), upper airway surgical options, such as traditional UPPP (which is not considered a first-line treatment) or the Inspire device (hypoglossal nerve stimulator, which would involve a referral for consultation with an ENT surgeon, after careful selection, following inclusion criteria - also not first-line treatment). I explained the PAP treatment option to the patient in detail, as this is generally considered first-line treatment.  The patient indicated that he would be willing to try PAP therapy, if the need arises. I explained the importance of being compliant with PAP treatment, not only for insurance purposes but primarily to improve patient's symptoms symptoms, and for the patient's long term health benefit, including to reduce His cardiovascular risks longer-term.    We will pick up our discussion about the next steps and treatment options after testing.  We will keep him posted as to the test results by phone call and/or MyChart messaging where possible.  We will plan to follow-up in sleep clinic accordingly as well.  I answered all his questions today and the patient was in agreement.   I encouraged him to call with any interim questions, concerns, problems or updates or email us  through MyChart.  Generally speaking, sleep test authorizations may take up to 2 weeks, sometimes less, sometimes longer, the patient is encouraged to get in touch with us  if they do not hear back from the sleep lab staff directly within the next 2 weeks.  Thank you very much for allowing me to participate in the care of this nice patient. If I can be of any further assistance to you please do not hesitate to call me at [phone].  Sincerely,   True Mar, MD, PhD

## 2024-01-08 ENCOUNTER — Other Ambulatory Visit: Payer: Self-pay | Admitting: *Deleted

## 2024-01-08 DIAGNOSIS — I77810 Thoracic aortic ectasia: Secondary | ICD-10-CM

## 2024-01-11 ENCOUNTER — Ambulatory Visit (HOSPITAL_COMMUNITY): Admission: RE | Admit: 2024-01-11 | Discharge: 2024-01-11 | Attending: Cardiology | Admitting: Cardiology

## 2024-01-11 DIAGNOSIS — I77810 Thoracic aortic ectasia: Secondary | ICD-10-CM | POA: Diagnosis present

## 2024-01-11 MED ORDER — IOHEXOL 350 MG/ML SOLN
100.0000 mL | Freq: Once | INTRAVENOUS | Status: AC | PRN
  Administered 2024-01-11: 100 mL via INTRAVENOUS

## 2024-01-19 ENCOUNTER — Ambulatory Visit: Payer: Self-pay | Admitting: Cardiology

## 2024-01-19 DIAGNOSIS — I77810 Thoracic aortic ectasia: Secondary | ICD-10-CM

## 2024-01-21 ENCOUNTER — Encounter: Payer: Self-pay | Admitting: Neurology

## 2024-02-10 ENCOUNTER — Encounter: Payer: Self-pay | Admitting: Internal Medicine

## 2024-02-11 ENCOUNTER — Ambulatory Visit: Admitting: Neurology

## 2024-02-11 DIAGNOSIS — G4720 Circadian rhythm sleep disorder, unspecified type: Secondary | ICD-10-CM

## 2024-02-11 DIAGNOSIS — G4733 Obstructive sleep apnea (adult) (pediatric): Secondary | ICD-10-CM | POA: Diagnosis not present

## 2024-02-11 DIAGNOSIS — R9431 Abnormal electrocardiogram [ECG] [EKG]: Secondary | ICD-10-CM

## 2024-02-11 DIAGNOSIS — R030 Elevated blood-pressure reading, without diagnosis of hypertension: Secondary | ICD-10-CM

## 2024-02-11 DIAGNOSIS — R0683 Snoring: Secondary | ICD-10-CM

## 2024-02-11 DIAGNOSIS — G4719 Other hypersomnia: Secondary | ICD-10-CM

## 2024-02-11 DIAGNOSIS — Z9189 Other specified personal risk factors, not elsewhere classified: Secondary | ICD-10-CM

## 2024-02-11 DIAGNOSIS — R351 Nocturia: Secondary | ICD-10-CM

## 2024-02-11 DIAGNOSIS — G4734 Idiopathic sleep related nonobstructive alveolar hypoventilation: Secondary | ICD-10-CM

## 2024-02-12 NOTE — Procedures (Unsigned)
 Physician Interpretation: Please see link under Procedure Tab or under Encounters tab for physician report, technical report, as well as O2 titration and/or PAP titration tables (if applicable).

## 2024-02-15 ENCOUNTER — Ambulatory Visit: Payer: Self-pay | Admitting: Neurology

## 2024-02-15 ENCOUNTER — Encounter: Payer: Self-pay | Admitting: Neurology

## 2024-02-15 DIAGNOSIS — G4734 Idiopathic sleep related nonobstructive alveolar hypoventilation: Secondary | ICD-10-CM

## 2024-02-15 DIAGNOSIS — G4733 Obstructive sleep apnea (adult) (pediatric): Secondary | ICD-10-CM

## 2024-02-21 NOTE — Telephone Encounter (Signed)
 Spoke to patient please review results note

## 2024-02-21 NOTE — Telephone Encounter (Signed)
 LVM for patient to call back and discuss Sleep study results

## 2024-02-21 NOTE — Telephone Encounter (Signed)
-----   Message from True Mar, MD sent at 02/15/2024  5:25 PM EST ----- Patient referred by PCP, seen by me on 12.4.25, diagnostic PSG on 02/11/24.    Please call and notify the patient that the recent sleep study showed moderate to severe obstructive sleep apnea. I recommend treatment for in the form of autoPAP. We may consider at a CPAP titration  study at a later date, if need be, which means, that we would ask him to come back in for a second sleep study with CPAP treatment. For now, I would like to start him on a so-called autoPAP machine  at home, through a DME company (of his choice, or as per insurance requirement). The DME representative will educate him on how to use the machine, how to put the mask on, etc. I have placed an order  in the chart. Please send referral, talk to patient, send report to referring MD. We will need a FU in sleep clinic for 10 weeks post-PAP set up, please arrange that with me or one of our NPs.  Thanks,   True Mar, MD, PhD [facility])

## 2024-02-21 NOTE — Telephone Encounter (Signed)
 Spoke to patient gave sleep study results Pt chose [employer] as DME  Gave pt [employer] # Pt aware of insurance compliance . Made pt initial cpap f/u appointment 03/2024 with Megan,NP sent orders to [employer] and forward sleep study results to PCP this afternoon Pt expressed understanding and thanked me for calling

## 2024-02-26 ENCOUNTER — Encounter: Payer: Self-pay | Admitting: Neurology

## 2024-02-29 ENCOUNTER — Ambulatory Visit (INDEPENDENT_AMBULATORY_CARE_PROVIDER_SITE_OTHER): Admitting: Neurology

## 2024-02-29 ENCOUNTER — Encounter: Payer: Self-pay | Admitting: Neurology

## 2024-02-29 VITALS — BP 156/84 | HR 69 | Ht 71.0 in | Wt 160.0 lb

## 2024-02-29 DIAGNOSIS — G4733 Obstructive sleep apnea (adult) (pediatric): Secondary | ICD-10-CM | POA: Diagnosis not present

## 2024-02-29 NOTE — Patient Instructions (Signed)
 Please get in touch with your DME company, [employer] regarding getting your AutoPap machine and getting set up at home.  Will plan a follow-up within 2 to 3 months after treatment start to see how things are going and how your compliance data looks, as the machine can store usage data, which provides valuable feedback.  Please try to be consistent with your AutoPap machine as insurance also requires a minimum amount of usage time per night to continue to pay for your supplies and your machine.

## 2024-02-29 NOTE — Progress Notes (Signed)
 Subjective:    Patient ID: Tom Gaines is a 79 y.o. male.  HPI    Interim history:   Tom Gaines is a 79 year old male with an underlying complex medical history of chronic kidney disease, homocystinemia, hyperlipidemia, gout, low back pain, allergy, arthritis, aortic dilatation, Barrett syndrome, basal cell cancer, bursitis, prediabetes, prostate cancer, status post prostatectomy, status post shoulder surgery, tested post tonsillectomy and adenoidectomy, status post cataract surgeries, lumbar spine surgery, and hernia repair, who presents for follow-up consultation of his obstructive sleep apnea after interim testing, to discuss treatment options.  The patient is unaccompanied today.  I first met him at the request of his primary care physician on 01/04/2024, at which time he reported snoring and excessive daytime somnolence.  He was advised to proceed with a sleep study.  He had a baseline polysomnogram through our sleep lab on 02/11/2024 which showed moderate obstructive sleep apnea with a total AHI of 19/h, supine AHI in the severe range at 41.5/h, REM AHI also in the severe range at 45.5/h, O2 nadir 74% with significant time below or at 88% saturation of nearly 1 hour for the night.  In fact, he was placed on supplemental oxygen for a portion of the study.  He was advised to proceed with home AutoPap therapy.  He initially agreed to start treatment but then requested a follow-up to discuss other options.    Today, 02/29/2024: He reports no new symptoms.  He was wondering if a dental device would be an option for him.  We talked about his sleep study results and talked about options in order of priority.  I would recommend AutoPap therapy for moderate to severe obstructive sleep apnea.  He does recall trying oxygen during the sleep study.  He is willing to proceed with PAP therapy.  He is not keen on pursuing any surgical options at this time.  The patient's allergies, current medications, family  history, past medical history, past social history, past surgical history and problem list were reviewed and updated as appropriate.    Previously:  01/04/2024: (He) reports snoring and excessive daytime somnolence.  His Epworth sleepiness score is 10 out of 24, fatigue severity score is 17 out of 63.  I reviewed your office note from 10/25/2023.  He has nocturia.  He drinks caffeine in the form of coffee, about 16 ounces in the mornings and recently has had some green tea daily.  He drinks alcohol daily for the past few months, 2 glasses of wine.  He reports that he recently restarted drinking alcohol after not drinking any for 13 years.  He has been advised to scale back on his alcohol consumption as he is certainly drinking more than what is typically recommended on a weekly basis.  He has used a bite guard in the past for grinding.  He has 2 dogs in the household who sleep in the room but not on the bed.  He has a TV in the bedroom but it is typically not on at night.  He quit smoking over 35 years ago.   He feels sleepy particularly in the afternoons.  He is widowed and lives alone, he has a lady friend that stays with him sometimes, she has noted his snoring and she herself uses a CPAP machine.  He has no family history of sleep apnea.  Bedtime is generally around 10 and rise time around 6.   His blood pressure is mildly elevated today.  He denies any symptoms from  elevated blood pressure such as head pressure or eye pressure or chest pain.   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 LDL goal equal less than 160, ideally less than 130. No FH MI or CVA   Low back pain 03/13/2014   Chronic, has seen Dr Claudene and Dr Gaspar Taking tramadol  as needed   Nonallopathic lesion of lumbosacral region 12/01/2015   Nonallopathic lesion of sacral region 12/01/2015   Nonallopathic lesion of thoracic region 12/01/2015   Pre-diabetes    Prediabetes 03/29/2016   Mom, sister DM   PROSTATE CANCER, HX OF 03/12/2008   Qualifier: Diagnosis of  By: Tish MD, [facility]   Radical prostatectomy 1998, Urologist Dr. Renay Moats     His Past Surgical History Is Significant For: Past Surgical History:  Procedure Laterality Date   CATARACT EXTRACTION, BILATERAL  2017   colonoscopy with polypectomy  2014   Dr Aneita FONTANA HERNIA REPAIR Right 06/29/2023   Procedure: REPAIR, HERNIA, INGUINAL, ADULT;  Surgeon: Belinda Cough, MD;  Location: [facility] OR;  Service: General;  Laterality: Right;  LATERALITY: RIGHT LMA & TAP BLOCK   LUMBAR DISC SURGERY  2002   Dr Colon   PROSTATECTOMY  1998   SHOULDER ARTHROSCOPY     right; 2011; left 1990   SIGMOIDOSCOPY     TONSILLECTOMY AND ADENOIDECTOMY      His Family History Is Significant For: Family History  Problem Relation Age of Onset   Diabetes Mother    Dementia Mother    Diabetes Sister    Healthy Daughter    Lung cancer Other        maternal uncle & aunt   Colon cancer Neg Hx    Stomach cancer Neg Hx    Arthritis Neg Hx    Heart disease Neg Hx    Stroke Neg Hx    Esophageal cancer Neg Hx    Liver cancer Neg Hx    Pancreatic cancer Neg Hx    Rectal cancer Neg Hx     Sleep apnea Neg Hx     His Social History Is Significant For: Social History   Socioeconomic History   Marital status: Single    Spouse name: Not on file   Number of children: Not on file   Years of education: Not on file   Highest education level: Professional school degree (e.g., MD, DDS, DVM, JD)  Occupational History   Occupation: retired  Tobacco Use   Smoking status: Former    Current packs/day: 0.00    Types: Cigarettes    Quit date: 01/31/1985  Years since quitting: 39.1    Passive exposure: Past   Smokeless tobacco: Never   Tobacco comments:    smoked  1965-   1987, up to 2.5 ppd  Vaping Use   Vaping status: Never Used  Substance and Sexual Activity   Alcohol use: Yes    Alcohol/week: 14.0 standard drinks of alcohol    Types: 14 Glasses of wine per week    Comment:  none since 2012   Drug use: No   Sexual activity: Yes  Other Topics Concern   Not on file  Social History Narrative   Exercises regularly - gym 5 days a week   Retired    Lives alone    Social Drivers of Health   Tobacco Use: Medium Risk (02/29/2024)   Patient History    Smoking Tobacco Use: Former    Smokeless Tobacco Use: Never    Passive Exposure: Past  Physicist, Medical Strain: Low Risk (10/25/2023)   Overall Financial Resource Strain (CARDIA)    Difficulty of Paying Living Expenses: Not hard at all  Food Insecurity: Low Risk (02/12/2024)   Received from [facility]   Epic    Within the past 12 months, you worried that your food would run out before you got money to buy more: Never true    Within the past 12 months, the food you bought just didn't last and you didn't have money to get more. : Never true  Transportation Needs: No Transportation Needs (02/12/2024)   Received from [facility]    In the past 12 months, has lack of reliable transportation kept you from medical appointments, meetings, work or from getting things needed for daily living? : No  Physical  Activity: Sufficiently Active (10/25/2023)   Exercise Vital Sign    Days of Exercise per Week: 5 days    Minutes of Exercise per Session: 40 min  Stress: No Stress Concern Present (10/25/2023)   [employer] of Occupational Health - Occupational Stress Questionnaire    Feeling of Stress: Not at all  Social Connections: Socially Isolated (10/25/2023)   Social Connection and Isolation Panel    Frequency of Communication with Friends and Family: Three times a week    Frequency of Social Gatherings with Friends and Family: Three times a week    Attends Religious Services: Never    Active Member of Clubs or Organizations: No    Attends Banker Meetings: Not on file    Marital Status: Widowed  Depression (PHQ2-9): Low Risk (10/25/2023)   Depression (PHQ2-9)    PHQ-2 Score: 0  Alcohol Screen: Low Risk (10/25/2023)   Alcohol Screen    Last Alcohol Screening Score (AUDIT): 3  Housing: Low Risk (02/12/2024)   Received from [facility]   Epic    What is your living situation today?: I have a steady place to live    Think about the place you live. Do you have problems with any of the following? Choose all that apply:: None/None on this list  Utilities: Low Risk (02/12/2024)   Received from [facility]   Utilities    In the past 12 months has the electric, gas, oil, or water company threatened to shut off services in your home? : No  Health Literacy: Adequate Health Literacy (04/21/2023)   B1300 Health Literacy    Frequency of need for help with medical instructions: Never    His Allergies Are:  Allergies[1]:   His Current Medications Are:  Outpatient Encounter Medications as of 02/29/2024  Medication Sig   acyclovir (ZOVIRAX) 800 MG tablet Take 400 mg by mouth 2 (two) times daily.   allopurinol  (ZYLOPRIM ) 300 MG tablet TAKE 1 TABLET BY MOUTH EVERY DAY   ASPIRIN 81 PO Take 81 mg by mouth daily.   Azelastine  HCl 137 MCG/SPRAY SOLN PLACE 1-2 SPRAYS INTO BOTH NOSTRILS 2 (TWO)  TIMES DAILY AS NEEDED. USE IN EACH NOSTRIL AS DIRECTED (Patient taking differently: as needed. PLACE 1-2 SPRAYS INTO BOTH NOSTRILS 2 (TWO) TIMES DAILY AS NEEDED. USE IN EACH NOSTRIL AS DIRECTED)   famotidine  (PEPCID ) 40 MG tablet Take 1 tablet (40 mg total) by mouth 2 (two) times daily.   ipratropium (ATROVENT) 0.06 % nasal spray Place 2 sprays into both nostrils daily as needed for rhinitis.   rosuvastatin  (CRESTOR ) 40 MG tablet TAKE 1 TABLET BY MOUTH EVERY DAY   traMADol  (ULTRAM ) 50 MG tablet Take 1 tablet (50 mg total) by mouth daily. For chronic back pain (Patient taking differently: Take 50 mg by mouth as needed. For chronic back pain)   amoxicillin -clavulanate (AUGMENTIN ) 875-125 MG tablet Take 1 tablet by mouth 2 (two) times daily.   No facility-administered encounter medications on file as of 02/29/2024.  :  Review of Systems:  Out of a complete 14 point review of systems, all are reviewed and negative with the exception of these symptoms as listed below:  Review of Systems  Objective:  Neurological Exam  Physical Exam Physical Examination:   Vitals:   02/29/24 1059  BP: (!) 156/84  Pulse: 69    General Examination: The patient is a very pleasant 79 y.o. male in no acute distress. He appears well-developed and well-nourished and well groomed.   HEENT: Normocephalic, atraumatic, pupils are equal, round and reactive to light, tracking well-preserved, corrective eyeglasses in place.  Hearing grossly intact.  Face is symmetric with normal facial animation, speech is clear without dysarthria, hypophonia or voice tremor.  Neck with full range of motion.  Airway examination revealed stable findings.  Moderate airway crowding noted.  No significant overbite.     Chest: Clear to auscultation without wheezing, rhonchi or crackles noted.   Heart: S1+S2+0, regular and normal without murmurs, rubs or gallops noted.    Abdomen: Soft, non-distended.   Extremities: There is no obvious  swelling in the distal lower extremities bilaterally.    Skin: Warm and dry without trophic changes noted.    Musculoskeletal: exam reveals no obvious joint deformities.    Neurologically:  Mental status: The patient is awake, alert and oriented in all 4 spheres. His immediate and remote memory, attention, language skills and fund of knowledge are appropriate. There is no evidence of aphasia, agnosia, apraxia or anomia. Speech is clear with normal prosody and enunciation. Thought process is linear. Mood is normal and affect is normal.  Cranial nerves II - XII are as described above under HEENT exam.  Motor exam: Normal bulk, moving all 4 extremities without restriction, no obvious action or resting tremor.  Fine motor skills and coordination: Intact grossly.  Cerebellar testing: No dysmetria or intention tremor. There is no truncal or gait ataxia.  Sensory exam: intact to light touch in the upper and lower extremities.  Gait, station and balance: He stands easily. No veering to one side is noted. No leaning to one side is noted. Posture is age-appropriate and stance is narrow based. Gait shows normal stride length and normal pace. No problems turning are noted.  Assessment and Plan:  In summary, SELAH ZELMAN is a 79 year old male with an underlying complex medical history of chronic kidney disease, homocystinemia, hyperlipidemia, gout, low back pain, allergy, arthritis, aortic dilatation, Barrett syndrome, basal cell cancer, bursitis, prediabetes, prostate cancer, status post prostatectomy, status post shoulder surgery, tested post tonsillectomy and adenoidectomy, status post cataract surgeries, lumbar spine surgery, and hernia repair, who presents for follow-up consultation of his obstructive sleep apnea after interim testing, to discuss results and possible treatment options.  We talked about his sleep study results.  For his moderate to severe obstructive sleep apnea I recommend PAP therapy.   He is willing to proceed.  Of note, his baseline sleep study through our sleep lab from 02/11/2024 which showed moderate to severe obstructive sleep apnea and evidence of nocturnal hypoxemia, with a total AHI of 19/h, supine AHI in the severe range at 41.5/h, REM AHI also in the severe range at 45.5/h, O2 nadir 74% with significant time below or at 88% saturation of nearly 1 hour for the night.  He was placed on supplemental oxygen at 1 L/min for a portion of the night.  We can consider an overnight pulse oximetry test once he is compliant with his AutoPap at home and consistent with it with good apnea control.  I explained the pulse oximetry test to him for later consideration.  He is willing to proceed with PAP therapy.  We will plan to see him in about 2 to 3 months after set up at home.  We talked about different interface options as well.  While we can consider a dental device and a referral to dentistry, treatment with a dental device is not first-line and not likely to be as successful and moderate to severe OSA.  I answered all his questions today and he was in agreement with our plan.  He is encouraged to get in touch with his DME company as the order was already placed and sent out.  We confirmed that he had the number for [employer]. I spent 30 minutes in total face-to-face time and in reviewing records during pre-charting, more than 50% of which was spent in counseling and coordination of care, reviewing test results, reviewing medications and treatment regimen and/or in discussing or reviewing the diagnosis of OSA, the prognosis and treatment options. Pertinent laboratory and imaging test results that were available during this visit with the patient were reviewed by me and considered in my medical decision making (see chart for details).      [1] No Known Allergies

## 2024-03-04 ENCOUNTER — Ambulatory Visit (HOSPITAL_COMMUNITY)
Admission: EM | Admit: 2024-03-04 | Discharge: 2024-03-04 | Disposition: A | Discharge status: Home / Self Care | Source: Home / Self Care | Attending: Family Medicine | Admitting: Family Medicine

## 2024-03-04 ENCOUNTER — Encounter (HOSPITAL_COMMUNITY): Payer: Self-pay | Admitting: *Deleted

## 2024-03-04 ENCOUNTER — Other Ambulatory Visit: Payer: Self-pay

## 2024-03-04 DIAGNOSIS — Z23 Encounter for immunization: Secondary | ICD-10-CM

## 2024-03-04 DIAGNOSIS — W540XXA Bitten by dog, initial encounter: Secondary | ICD-10-CM

## 2024-03-04 DIAGNOSIS — M79645 Pain in left finger(s): Secondary | ICD-10-CM

## 2024-03-04 MED ORDER — TETANUS-DIPHTH-ACELL PERTUSSIS 5-2-15.5 LF-MCG/0.5 IM SUSP
INTRAMUSCULAR | Status: AC
  Filled 2024-03-04: qty 0.5

## 2024-03-04 MED ORDER — TETANUS-DIPHTH-ACELL PERTUSSIS 5-2-15.5 LF-MCG/0.5 IM SUSP
0.5000 mL | Freq: Once | INTRAMUSCULAR | Status: AC
  Administered 2024-03-04: 0.5 mL via INTRAMUSCULAR

## 2024-03-04 MED ORDER — AMOXICILLIN-POT CLAVULANATE 875-125 MG PO TABS: 1.0000 | ORAL_TABLET | Freq: Two times a day (BID) | ORAL | 0 refills | Status: AC

## 2024-03-04 NOTE — Discharge Instructions (Signed)
 You have been given a Tdap vaccination to boost your tetanus immunity  Take amoxicillin -clavulanate 875 mg--1 tab twice daily with food for 7 days  2 times a day wash the cuts with soapy water and put new triple antibiotic ointment on them.

## 2024-03-05 ENCOUNTER — Encounter: Payer: Self-pay | Admitting: Neurology

## 2024-03-06 ENCOUNTER — Telehealth: Payer: Self-pay

## 2024-03-06 NOTE — Telephone Encounter (Signed)
 SABRA

## 2024-03-08 ENCOUNTER — Telehealth (INDEPENDENT_AMBULATORY_CARE_PROVIDER_SITE_OTHER): Payer: Self-pay

## 2024-03-08 NOTE — Telephone Encounter (Signed)
 Patient left voicemail wanting to get refill on ipratropium. Please advise

## 2024-04-17 ENCOUNTER — Encounter: Admitting: Adult Health

## 2024-04-19 ENCOUNTER — Ambulatory Visit: Admitting: Adult Health

## 2024-04-22 ENCOUNTER — Ambulatory Visit

## 2024-04-24 ENCOUNTER — Ambulatory Visit: Admitting: Internal Medicine

## 2024-04-30 ENCOUNTER — Ambulatory Visit

## 2025-01-13 ENCOUNTER — Other Ambulatory Visit (HOSPITAL_COMMUNITY)
